# Patient Record
Sex: Female | Born: 1950 | Race: White | Hispanic: Yes | Smoking: Never smoker
Health system: Southern US, Community
[De-identification: ages and names within clinical notes are randomized; demographics above are authoritative.]

## PROBLEM LIST (undated history)

## (undated) DIAGNOSIS — I509 Heart failure, unspecified: Secondary | ICD-10-CM

## (undated) DIAGNOSIS — J159 Unspecified bacterial pneumonia: Secondary | ICD-10-CM

## (undated) DIAGNOSIS — E119 Type 2 diabetes mellitus without complications: Secondary | ICD-10-CM

## (undated) DIAGNOSIS — I10 Essential (primary) hypertension: Secondary | ICD-10-CM

## (undated) HISTORY — DX: Unspecified bacterial pneumonia: J15.9

---

## 2008-05-05 ENCOUNTER — Emergency Department (HOSPITAL_COMMUNITY): Admission: EM | Admit: 2008-05-05 | Discharge: 2008-05-06 | Payer: Self-pay | Admitting: Emergency Medicine

## 2008-12-02 ENCOUNTER — Ambulatory Visit: Payer: Self-pay | Admitting: Physician Assistant

## 2008-12-02 DIAGNOSIS — E1165 Type 2 diabetes mellitus with hyperglycemia: Secondary | ICD-10-CM | POA: Insufficient documentation

## 2008-12-02 DIAGNOSIS — I10 Essential (primary) hypertension: Secondary | ICD-10-CM | POA: Insufficient documentation

## 2008-12-02 LAB — CONVERTED CEMR LAB
Glucose, Urine, Semiquant: NEGATIVE
Nitrite: NEGATIVE
Protein, U semiquant: 100
Specific Gravity, Urine: 1.025
WBC Urine, dipstick: NEGATIVE

## 2008-12-03 ENCOUNTER — Encounter: Payer: Self-pay | Admitting: Physician Assistant

## 2008-12-03 ENCOUNTER — Telehealth: Payer: Self-pay | Admitting: Physician Assistant

## 2008-12-04 ENCOUNTER — Telehealth: Payer: Self-pay | Admitting: Physician Assistant

## 2008-12-04 ENCOUNTER — Encounter: Payer: Self-pay | Admitting: Physician Assistant

## 2008-12-04 LAB — CONVERTED CEMR LAB
ALT: 19 units/L (ref 0–35)
AST: 22 units/L (ref 0–37)
Alkaline Phosphatase: 78 units/L (ref 39–117)
Basophils Absolute: 0 10*3/uL (ref 0.0–0.1)
Basophils Relative: 0 % (ref 0–1)
Cholesterol: 160 mg/dL (ref 0–200)
Creatinine, Ser: 0.57 mg/dL (ref 0.40–1.20)
Eosinophils Absolute: 0.1 10*3/uL (ref 0.0–0.7)
LDL Cholesterol: 90 mg/dL (ref 0–99)
MCHC: 33 g/dL (ref 30.0–36.0)
MCV: 96 fL (ref 78.0–100.0)
Neutro Abs: 5.3 10*3/uL (ref 1.7–7.7)
Neutrophils Relative %: 56 % (ref 43–77)
Platelets: 257 10*3/uL (ref 150–400)
RBC: 4.48 M/uL (ref 3.87–5.11)
Sodium: 139 meq/L (ref 135–145)
TSH: 0.881 microintl units/mL (ref 0.350–4.500)
Total Bilirubin: 1.1 mg/dL (ref 0.3–1.2)
Total CHOL/HDL Ratio: 3.3
Total Protein: 7.6 g/dL (ref 6.0–8.3)
VLDL: 22 mg/dL (ref 0–40)
WBC: 9.4 10*3/uL (ref 4.0–10.5)

## 2008-12-05 ENCOUNTER — Ambulatory Visit (HOSPITAL_COMMUNITY): Admission: RE | Admit: 2008-12-05 | Discharge: 2008-12-05 | Payer: Self-pay | Admitting: Internal Medicine

## 2008-12-10 ENCOUNTER — Ambulatory Visit: Payer: Self-pay | Admitting: Physician Assistant

## 2008-12-10 ENCOUNTER — Encounter: Payer: Self-pay | Admitting: Physician Assistant

## 2008-12-10 LAB — CONVERTED CEMR LAB
Glucose, Urine, Semiquant: NEGATIVE
Protein, U semiquant: 100
Specific Gravity, Urine: 1.03
WBC Urine, dipstick: NEGATIVE

## 2008-12-11 ENCOUNTER — Telehealth (INDEPENDENT_AMBULATORY_CARE_PROVIDER_SITE_OTHER): Payer: Self-pay | Admitting: *Deleted

## 2008-12-12 ENCOUNTER — Ambulatory Visit: Payer: Self-pay | Admitting: Internal Medicine

## 2008-12-16 ENCOUNTER — Ambulatory Visit: Payer: Self-pay | Admitting: Physician Assistant

## 2008-12-18 ENCOUNTER — Encounter: Payer: Self-pay | Admitting: Physician Assistant

## 2008-12-19 ENCOUNTER — Encounter: Payer: Self-pay | Admitting: Physician Assistant

## 2008-12-20 ENCOUNTER — Telehealth: Payer: Self-pay | Admitting: Physician Assistant

## 2008-12-20 ENCOUNTER — Encounter: Payer: Self-pay | Admitting: Physician Assistant

## 2008-12-20 LAB — CONVERTED CEMR LAB: Microalb, Ur: 12.4 mg/dL — ABNORMAL HIGH (ref 0.00–1.89)

## 2008-12-24 ENCOUNTER — Encounter: Payer: Self-pay | Admitting: Physician Assistant

## 2008-12-26 ENCOUNTER — Encounter: Payer: Self-pay | Admitting: Physician Assistant

## 2009-01-01 ENCOUNTER — Ambulatory Visit: Payer: Self-pay | Admitting: *Deleted

## 2009-01-02 ENCOUNTER — Ambulatory Visit: Payer: Self-pay | Admitting: Physician Assistant

## 2009-01-15 ENCOUNTER — Telehealth (INDEPENDENT_AMBULATORY_CARE_PROVIDER_SITE_OTHER): Payer: Self-pay | Admitting: *Deleted

## 2009-01-15 ENCOUNTER — Ambulatory Visit: Payer: Self-pay | Admitting: Physician Assistant

## 2009-01-15 DIAGNOSIS — R809 Proteinuria, unspecified: Secondary | ICD-10-CM | POA: Insufficient documentation

## 2009-01-15 DIAGNOSIS — M545 Low back pain: Secondary | ICD-10-CM

## 2009-01-27 ENCOUNTER — Ambulatory Visit: Payer: Self-pay | Admitting: Physician Assistant

## 2009-01-29 ENCOUNTER — Telehealth: Payer: Self-pay | Admitting: Physician Assistant

## 2009-02-17 ENCOUNTER — Encounter: Payer: Self-pay | Admitting: Physician Assistant

## 2009-03-03 ENCOUNTER — Ambulatory Visit: Payer: Self-pay | Admitting: Physician Assistant

## 2009-03-03 DIAGNOSIS — B3731 Acute candidiasis of vulva and vagina: Secondary | ICD-10-CM | POA: Insufficient documentation

## 2009-03-03 DIAGNOSIS — B351 Tinea unguium: Secondary | ICD-10-CM | POA: Insufficient documentation

## 2009-03-03 DIAGNOSIS — B373 Candidiasis of vulva and vagina: Secondary | ICD-10-CM

## 2009-03-03 DIAGNOSIS — K5900 Constipation, unspecified: Secondary | ICD-10-CM | POA: Insufficient documentation

## 2009-03-03 LAB — CONVERTED CEMR LAB
Blood Glucose, Fingerstick: 285
KOH Prep: NEGATIVE
Nitrite: NEGATIVE
Protein, U semiquant: >=300

## 2009-03-04 ENCOUNTER — Encounter: Payer: Self-pay | Admitting: Physician Assistant

## 2009-03-05 ENCOUNTER — Telehealth: Payer: Self-pay | Admitting: Physician Assistant

## 2009-03-16 ENCOUNTER — Encounter: Payer: Self-pay | Admitting: Physician Assistant

## 2009-03-18 LAB — CONVERTED CEMR LAB
Calcium: 9.1 mg/dL (ref 8.4–10.5)
Chlamydia, DNA Probe: NEGATIVE
Chloride: 101 meq/L (ref 96–112)
Creatinine, Ser: 0.68 mg/dL (ref 0.40–1.20)
GC Probe Amp, Genital: NEGATIVE

## 2009-04-24 ENCOUNTER — Telehealth: Payer: Self-pay | Admitting: Physician Assistant

## 2009-06-02 ENCOUNTER — Ambulatory Visit: Payer: Self-pay | Admitting: Physician Assistant

## 2009-06-02 LAB — CONVERTED CEMR LAB
Hgb A1c MFr Bld: 9.6 %
Nitrite: NEGATIVE
Specific Gravity, Urine: >=1.03
pH: 5

## 2009-06-03 ENCOUNTER — Encounter: Payer: Self-pay | Admitting: Physician Assistant

## 2009-06-05 ENCOUNTER — Encounter: Payer: Self-pay | Admitting: Physician Assistant

## 2009-06-05 DIAGNOSIS — N390 Urinary tract infection, site not specified: Secondary | ICD-10-CM

## 2009-06-05 LAB — CONVERTED CEMR LAB
BUN: 14 mg/dL (ref 6–23)
Creatinine, Ser: 0.54 mg/dL (ref 0.40–1.20)
Crystals: NONE SEEN

## 2009-06-30 ENCOUNTER — Ambulatory Visit: Payer: Self-pay | Admitting: Physician Assistant

## 2009-07-08 ENCOUNTER — Ambulatory Visit: Payer: Self-pay | Admitting: Physician Assistant

## 2009-07-09 ENCOUNTER — Telehealth: Payer: Self-pay | Admitting: Physician Assistant

## 2009-07-15 ENCOUNTER — Telehealth: Payer: Self-pay | Admitting: Physician Assistant

## 2009-07-15 ENCOUNTER — Ambulatory Visit: Payer: Self-pay | Admitting: Physician Assistant

## 2009-07-29 ENCOUNTER — Ambulatory Visit: Payer: Self-pay | Admitting: Physician Assistant

## 2009-08-08 ENCOUNTER — Ambulatory Visit: Payer: Self-pay | Admitting: Physician Assistant

## 2009-08-19 ENCOUNTER — Ambulatory Visit (HOSPITAL_COMMUNITY): Admission: RE | Admit: 2009-08-19 | Discharge: 2009-08-19 | Payer: Self-pay | Admitting: Gastroenterology

## 2009-08-19 LAB — HM COLONOSCOPY

## 2009-09-15 ENCOUNTER — Ambulatory Visit: Payer: Self-pay | Admitting: Physician Assistant

## 2009-09-15 ENCOUNTER — Ambulatory Visit (HOSPITAL_COMMUNITY): Admission: RE | Admit: 2009-09-15 | Discharge: 2009-09-15 | Payer: Self-pay | Admitting: Physician Assistant

## 2009-09-15 DIAGNOSIS — J209 Acute bronchitis, unspecified: Secondary | ICD-10-CM

## 2009-09-15 LAB — CONVERTED CEMR LAB: Hgb A1c MFr Bld: 8.5 %

## 2009-09-16 LAB — CONVERTED CEMR LAB
BUN: 16 mg/dL (ref 6–23)
Chloride: 100 meq/L (ref 96–112)
Eosinophils Relative: 6 % — ABNORMAL HIGH (ref 0–5)
HCT: 43.5 % (ref 36.0–46.0)
Hemoglobin: 14.4 g/dL (ref 12.0–15.0)
Lymphocytes Relative: 19 % (ref 12–46)
Lymphs Abs: 2.4 10*3/uL (ref 0.7–4.0)
Platelets: 278 10*3/uL (ref 150–400)
Potassium: 4.8 meq/L (ref 3.5–5.3)
WBC: 12.5 10*3/uL — ABNORMAL HIGH (ref 4.0–10.5)

## 2009-09-17 ENCOUNTER — Ambulatory Visit: Payer: Self-pay | Admitting: Internal Medicine

## 2009-09-23 ENCOUNTER — Ambulatory Visit: Payer: Self-pay | Admitting: Physician Assistant

## 2009-09-25 ENCOUNTER — Ambulatory Visit: Payer: Self-pay | Admitting: Physician Assistant

## 2009-09-25 DIAGNOSIS — R05 Cough: Secondary | ICD-10-CM

## 2009-09-25 DIAGNOSIS — R0902 Hypoxemia: Secondary | ICD-10-CM | POA: Insufficient documentation

## 2009-09-26 ENCOUNTER — Encounter: Payer: Self-pay | Admitting: Physician Assistant

## 2009-09-26 LAB — CONVERTED CEMR LAB
BUN: 14 mg/dL (ref 6–23)
CO2: 26 meq/L (ref 19–32)
Chloride: 99 meq/L (ref 96–112)
Creatinine, Ser: 0.6 mg/dL (ref 0.40–1.20)
Eosinophils Absolute: 0.2 10*3/uL (ref 0.0–0.7)
Eosinophils Relative: 2 % (ref 0–5)
Glucose, Bld: 167 mg/dL — ABNORMAL HIGH (ref 70–99)
HCT: 42 % (ref 36.0–46.0)
Hemoglobin: 14.4 g/dL (ref 12.0–15.0)
Lymphs Abs: 2.5 10*3/uL (ref 0.7–4.0)
MCV: 94.4 fL (ref 78.0–100.0)
Monocytes Absolute: 0.5 10*3/uL (ref 0.1–1.0)
Monocytes Relative: 5 % (ref 3–12)
Platelets: 269 10*3/uL (ref 150–400)
RBC: 4.45 M/uL (ref 3.87–5.11)
WBC: 11.4 10*3/uL — ABNORMAL HIGH (ref 4.0–10.5)

## 2009-09-30 ENCOUNTER — Ambulatory Visit (HOSPITAL_COMMUNITY): Admission: RE | Admit: 2009-09-30 | Discharge: 2009-09-30 | Payer: Self-pay | Admitting: Internal Medicine

## 2009-10-09 ENCOUNTER — Ambulatory Visit: Payer: Self-pay | Admitting: Physician Assistant

## 2009-10-09 DIAGNOSIS — J159 Unspecified bacterial pneumonia: Secondary | ICD-10-CM

## 2009-10-09 DIAGNOSIS — H10029 Other mucopurulent conjunctivitis, unspecified eye: Secondary | ICD-10-CM | POA: Insufficient documentation

## 2009-10-09 HISTORY — DX: Unspecified bacterial pneumonia: J15.9

## 2009-10-21 ENCOUNTER — Ambulatory Visit: Payer: Self-pay | Admitting: Physician Assistant

## 2009-10-28 ENCOUNTER — Ambulatory Visit: Payer: Self-pay | Admitting: Physician Assistant

## 2009-11-28 ENCOUNTER — Ambulatory Visit: Payer: Self-pay | Admitting: Physician Assistant

## 2009-11-30 LAB — CONVERTED CEMR LAB
CO2: 25 meq/L (ref 19–32)
Calcium: 9.8 mg/dL (ref 8.4–10.5)
Chloride: 98 meq/L (ref 96–112)
Creatinine, Ser: 0.64 mg/dL (ref 0.40–1.20)
Glucose, Bld: 173 mg/dL — ABNORMAL HIGH (ref 70–99)

## 2009-12-05 ENCOUNTER — Ambulatory Visit: Payer: Self-pay | Admitting: Physician Assistant

## 2009-12-30 ENCOUNTER — Ambulatory Visit: Payer: Self-pay | Admitting: Physician Assistant

## 2009-12-30 DIAGNOSIS — R82998 Other abnormal findings in urine: Secondary | ICD-10-CM

## 2009-12-30 LAB — CONVERTED CEMR LAB
Bacteria, UA: NONE SEEN
Bilirubin Urine: NEGATIVE
Casts: NONE SEEN /lpf
Nitrite: NEGATIVE
Specific Gravity, Urine: 1.025
Urobilinogen, UA: 0.2

## 2009-12-31 ENCOUNTER — Encounter: Payer: Self-pay | Admitting: Physician Assistant

## 2010-01-01 ENCOUNTER — Encounter: Payer: Self-pay | Admitting: Physician Assistant

## 2010-01-07 ENCOUNTER — Ambulatory Visit (HOSPITAL_COMMUNITY): Admission: RE | Admit: 2010-01-07 | Discharge: 2010-01-07 | Payer: Self-pay | Admitting: Family Medicine

## 2010-01-07 ENCOUNTER — Encounter: Payer: Self-pay | Admitting: Physician Assistant

## 2010-01-14 ENCOUNTER — Encounter: Payer: Self-pay | Admitting: Physician Assistant

## 2010-01-20 ENCOUNTER — Ambulatory Visit: Payer: Self-pay | Admitting: Internal Medicine

## 2010-01-20 ENCOUNTER — Encounter: Payer: Self-pay | Admitting: Physician Assistant

## 2010-01-21 ENCOUNTER — Telehealth: Payer: Self-pay | Admitting: Physician Assistant

## 2010-01-21 ENCOUNTER — Encounter: Payer: Self-pay | Admitting: Physician Assistant

## 2010-01-21 LAB — CONVERTED CEMR LAB
CO2: 23 meq/L (ref 19–32)
Calcium: 9.2 mg/dL (ref 8.4–10.5)
Collection Interval-CRCL: 24 hr
Creatinine 24 HR UR: 670 mg/24hr — ABNORMAL LOW (ref 700–1800)
Creatinine, Ser: 0.6 mg/dL (ref 0.40–1.20)
Creatinine, Urine: 53.2 mg/dL
Protein, Ur: 125 mg/24hr — ABNORMAL HIGH (ref 50–100)
Sodium: 136 meq/L (ref 135–145)

## 2010-01-29 ENCOUNTER — Telehealth (INDEPENDENT_AMBULATORY_CARE_PROVIDER_SITE_OTHER): Payer: Self-pay | Admitting: Nurse Practitioner

## 2010-02-06 ENCOUNTER — Encounter (INDEPENDENT_AMBULATORY_CARE_PROVIDER_SITE_OTHER): Payer: Self-pay | Admitting: Nurse Practitioner

## 2010-02-16 ENCOUNTER — Telehealth (INDEPENDENT_AMBULATORY_CARE_PROVIDER_SITE_OTHER): Payer: Self-pay | Admitting: Nurse Practitioner

## 2010-02-17 ENCOUNTER — Ambulatory Visit: Payer: Self-pay | Admitting: Nurse Practitioner

## 2010-03-23 ENCOUNTER — Ambulatory Visit: Payer: Self-pay | Admitting: Physician Assistant

## 2010-03-23 ENCOUNTER — Encounter (INDEPENDENT_AMBULATORY_CARE_PROVIDER_SITE_OTHER): Payer: Self-pay | Admitting: Nurse Practitioner

## 2010-03-23 DIAGNOSIS — E669 Obesity, unspecified: Secondary | ICD-10-CM

## 2010-03-31 ENCOUNTER — Encounter (INDEPENDENT_AMBULATORY_CARE_PROVIDER_SITE_OTHER): Payer: Self-pay | Admitting: Nurse Practitioner

## 2010-03-31 ENCOUNTER — Ambulatory Visit: Payer: Self-pay | Admitting: Nurse Practitioner

## 2010-03-31 LAB — CONVERTED CEMR LAB
Cholesterol: 175 mg/dL (ref 0–200)
HDL: 47 mg/dL (ref >39)
LDL Cholesterol: 102 mg/dL — ABNORMAL HIGH (ref 0–99)
Total CHOL/HDL Ratio: 3.7
Triglycerides: 130 mg/dL (ref <150)
VLDL: 26 mg/dL (ref 0–40)

## 2010-04-01 ENCOUNTER — Encounter (INDEPENDENT_AMBULATORY_CARE_PROVIDER_SITE_OTHER): Payer: Self-pay | Admitting: Nurse Practitioner

## 2010-04-27 ENCOUNTER — Encounter: Payer: Self-pay | Admitting: Internal Medicine

## 2010-05-04 ENCOUNTER — Ambulatory Visit
Admission: RE | Admit: 2010-05-04 | Discharge: 2010-05-04 | Payer: Self-pay | Source: Home / Self Care | Attending: Nurse Practitioner | Admitting: Nurse Practitioner

## 2010-05-04 ENCOUNTER — Encounter (INDEPENDENT_AMBULATORY_CARE_PROVIDER_SITE_OTHER): Payer: Self-pay | Admitting: Nurse Practitioner

## 2010-05-04 DIAGNOSIS — B353 Tinea pedis: Secondary | ICD-10-CM | POA: Insufficient documentation

## 2010-05-04 LAB — CONVERTED CEMR LAB
Blood in Urine, dipstick: NEGATIVE
Glucose, Urine, Semiquant: NEGATIVE
Nitrite: NEGATIVE
Protein, U semiquant: NEGATIVE
Urobilinogen, UA: 0.2
WBC Urine, dipstick: NEGATIVE
pH: 5.5

## 2010-05-05 NOTE — Letter (Signed)
Summary: [facility]  [facility]   Imported By: Arta Bruce 03/06/2010 11:28:26  _____________________________________________________________________  External Attachment:    Type:   Image     Comment:   External Document

## 2010-05-05 NOTE — Progress Notes (Signed)
Summary: Please call lab  Phone Note Outgoing Call   Summary of Call: 24 hour urine was for creatinine clearance and total protein. Please have lab do calculation for creatinine clearance and check total protein.  Initial call taken by: Brynda Rim,  January 21, 2010 8:35 AM  Follow-up for Phone Call        spoke with lab and added test Follow-up by: Armenia Shannon,  January 21, 2010 11:23 AM

## 2010-05-05 NOTE — Assessment & Plan Note (Signed)
Summary: FU VISIT IN 2 MONTHS WITH Carol Finley FOR DM//GK   Vital Signs:  Patient profile:   60 year old female Height:      62 inches Weight:      179 pounds BMI:     32.86 Temp:     97.9 degrees F oral Pulse rate:   78 / minute Pulse rhythm:   regular BP sitting:   164 / 106  (left arm) Cuff size:   large  Vitals Entered By: Armenia Shannon (September 15, 2009 9:53 AM) CC: Hypertension Management Is Patient Diabetic? Yes Pain Assessment Patient in pain? no      CBG Result 209  Does patient need assistance? Functional Status Self care Ambulation Normal   Primary Care Provider:  Tereso Newcomer PA-C  CC:  Hypertension Management.  History of Present Illness: Here for f/u.  HTN:  Not taking HCTZ.  States she was not given refill.  Suspect this is why her BP has been up of late.   Cough:  Notes cough for about 2 weeks.  No fever recorded but feels hot.  Cough is productive of sputum that is yellow.  Notes sore throat.  Notes headache . . . frontal.  Notes upper chest pain when she coughs.  Also, notes dyspnea especially at night.  No over the counter therapies tried.  She is using some candies and it is helping a little with her cough.  Notes she had some URI symptoms prior to her cough starting.    DM:  A1C 8.5 today.  Max'd out on all meds.     Hypertension History:      She denies syncope.  Further comments include: see HPI.        Positive major cardiovascular risk factors include female age 87 years old or older, diabetes, and hypertension.  Negative major cardiovascular risk factors include non-tobacco-user status.     Problems Prior to Update: 1)  Acute Bronchitis  (ICD-466.0) 2)  Vaginitis, Candidal  (ICD-112.1) 3)  Onychomycosis, Bilateral  (ICD-110.1) 4)  Constipation  (ICD-564.00) 5)  Microalbuminuria  (ICD-791.0) 6)  Lumbago  (ICD-724.2) 7)  Uti  (ICD-599.0) 8)  Preventive Health Care  (ICD-V70.0) 9)  Family History Diabetes 1st Degree Relative  (ICD-V18.0) 10)   Essential Hypertension, Benign  (ICD-401.1) 11)  Diabetes Mellitus, Type II  (ICD-250.00)  Allergies: No Known Drug Allergies  Physical Exam  General:  alert, well-developed, and well-nourished.   Head:  normocephalic and atraumatic.   Neck:  no jvd  Lungs:  coarse breath sounds throughout exp rhonchi throughout as well no wheezing  Heart:  normal rate, regular rhythm, and no gallop.   Abdomen:  soft and non-tender.   Extremities:  trace left pedal edema and trace right pedal edema.   Neurologic:  alert & oriented X3 and cranial nerves II-XII intact.   Psych:  normally interactive.     Impression & Recommendations:  Problem # 1:  ESSENTIAL HYPERTENSION, BENIGN (ICD-401.1)  restart HCTZ f/u at f/iu visit   Her updated medication list for this problem includes:    Atenolol 100 Mg Tabs (Atenolol) ..... One tab daily    Hydrochlorothiazide 25 Mg Tabs (Hydrochlorothiazide) ..... One tab daily    Norvasc 10 Mg Tabs (Amlodipine besylate) ..... One tab daily    Lisinopril 40 Mg Tabs (Lisinopril) .Marland Kitchen... Take 1 tablet by mouth two times a day    Catapres 0.3 Mg Tabs (Clonidine hcl) .Marland Kitchen... Take 1 tablet by mouth two times a  day  Orders: T-Basic Metabolic Panel [id number])  Problem # 2:  DIABETES MELLITUS, TYPE II (ICD-250.00)  suggest she start Lantus 10 units at bedtime will give Rx and have her schedule appt with Susie to get instruction decrease glucotrol to once daily  close f/u with me  Her updated medication list for this problem includes:    Janumet 50-1000 Mg Tabs (Sitagliptin-metformin hcl) .Marland Kitchen... Take 1 tablet by mouth two times a day    Glucotrol Xl 10 Mg Xr24h-tab (Glipizide) .Marland Kitchen... Take 1 tablet by mouth two times a day    Lisinopril 40 Mg Tabs (Lisinopril) .Marland Kitchen... Take 1 tablet by mouth two times a day    Aspirin 81 Mg Tabs (Aspirin) .Marland Kitchen... Take 1 tablet by mouth once a day    Lantus Solostar 100 Unit/ml Soln (Insulin glargine) ..... Inect 10 units at  bedtime  Orders: Capillary Blood Glucose/CBG (82948) Hemoglobin A1C (83036)  Problem # 3:  ACUTE BRONCHITIS (ICD-466.0)  concerned about her lung exam will treat with doxy x 7 days give albuterol to use scheduled at first, then as needed check cxr get cbc, bmet today f/u with me in 7-10 days  Orders: CXR- 2view (CXR) T-CBC w/Diff ([id number]) T-Basic Metabolic Panel ([id number])  Her updated medication list for this problem includes:    Doxycycline Hyclate 100 Mg Tabs (Doxycycline hyclate) .Marland Kitchen... Take 1 tablet by mouth two times a day    Proventil Hfa 108 (90 Base) Mcg/act Aers (Albuterol sulfate) .Marland Kitchen... 1-2 puffs every 6 hours as needed  Complete Medication List: 1)  Atenolol 100 Mg Tabs (Atenolol) .... One tab daily 2)  Hydrochlorothiazide 25 Mg Tabs (Hydrochlorothiazide) .... One tab daily 3)  Norvasc 10 Mg Tabs (Amlodipine besylate) .... One tab daily 4)  Janumet 50-1000 Mg Tabs (Sitagliptin-metformin hcl) .... Take 1 tablet by mouth two times a day 5)  Glucotrol Xl 10 Mg Xr24h-tab (Glipizide) .... Take 1 tablet by mouth two times a day 6)  Lisinopril 40 Mg Tabs (Lisinopril) .... Take 1 tablet by mouth two times a day 7)  Catapres 0.3 Mg Tabs (Clonidine hcl) .... Take 1 tablet by mouth two times a day 8)  Naprosyn 500 Mg Tabs (Naproxen) .... Take 1 tablet by mouth two times a day as needed for pain 9)  Flexeril 10 Mg Tabs (Cyclobenzaprine hcl) .... Take 1 tab by mouth at bedtime as needed for muscle spasm 10)  Diflucan 150 Mg Tabs (Fluconazole) .Marland Kitchen.. 1 by mouth x 1 11)  Sidekick Blood Glucose System Devi (Blood gluc meter disp-strips) .... As directed 12)  Glucose Meter Test Strips (any Brand)  .... Check sugar once a day 13)  Aspirin 81 Mg Tabs (Aspirin) .... Take 1 tablet by mouth once a day 14)  Lantus Solostar 100 Unit/ml Soln (Insulin glargine) .... Inect 10 units at bedtime 15)  Doxycycline Hyclate 100 Mg Tabs (Doxycycline hyclate) .... Take 1 tablet by mouth two times  a day 16)  Proventil Hfa 108 (90 Base) Mcg/act Aers (Albuterol sulfate) .Marland Kitchen.. 1-2 puffs every 6 hours as needed  Hypertension Assessment/Plan:      The patient's hypertensive risk group is category C: Target organ damage and/or diabetes.  Her calculated 10 year risk of coronary heart disease is 20 %.  Today's blood pressure is 164/106.    Patient Instructions: 1)  Get Lantus insulin filled at pharmacy. 2)  Schedule appointment with Susie Piper to get instruction on how to use insulin.   3)  Do not start using  insulin until you have seen Susie. 4)  Decrease Glucotrol to Take 1 tablet by mouth once a day once you start the Lantus. 5)  Keep checking sugars regularly and bring recording in to next appointment. 6)  Drink plenty of fluids.  Get plenty of rest. 7)  Take tylenol 500 mg 2 tabs every 6 hours as needed for pain or fever. 8)  Call or go to the ED if you run a fever of 101 or higher or you feel worse. 9)  Take antibiotics until all gone. 10)  Use Proventil every 6 hours for 2 days, then use every 6 hours as needed. 11)  Schedule follow up with Mahamadou Weltz in 7-10 days for breathing. 12)  Schedule follow up with Lorin Picket in one month for diabetes and to follow up on taking insulin. 13)  Call our office if your sugar drops too low and you feel bad with it. Prescriptions: PROVENTIL HFA 108 (90 BASE) MCG/ACT AERS (ALBUTEROL SULFATE) 1-2 puffs every 6 hours as needed  #1 x 2   Entered and Authorized by:   Tereso Newcomer PA-C   Signed by:   Tereso Newcomer PA-C on 09/15/2009   Method used:   Print then Give to Patient   RxID:   [id number] DOXYCYCLINE HYCLATE 100 MG TABS (DOXYCYCLINE HYCLATE) Take 1 tablet by mouth two times a day  #14 x 0   Entered and Authorized by:   Tereso Newcomer PA-C   Signed by:   Tereso Newcomer PA-C on 09/15/2009   Method used:   Print then Give to Patient   RxID:   [id number] LANTUS SOLOSTAR 100 UNIT/ML SOLN (INSULIN GLARGINE) Inect 10 units at bedtime  #1 mo supply x  11   Entered and Authorized by:   Tereso Newcomer PA-C   Signed by:   Tereso Newcomer PA-C on 09/15/2009   Method used:   Print then Give to Patient   RxID:   [id number] HYDROCHLOROTHIAZIDE 25 MG TABS (HYDROCHLOROTHIAZIDE) one tab daily  #30 x 5   Entered and Authorized by:   Tereso Newcomer PA-C   Signed by:   Tereso Newcomer PA-C on 09/15/2009   Method used:   Print then Give to Patient   RxID:   [id number]   Laboratory Results   Blood Tests     HGBA1C: 8.5%   (Normal Range: Non-Diabetic - 3-6%   Control Diabetic - 6-8%) CBG Random:: 209mg /dL       Colonoscopy  Procedure date:  08/19/2009  Findings:       Results: Normal. Location:  [facility].  Dr. Dulce Sellar  Comments:      Repeat colonoscopy in 10 years.

## 2010-05-05 NOTE — Progress Notes (Signed)
Summary: REQUESTING NEEDLES.  Phone Note Call from Patient Call back at [phone]   Summary of Call: CALLING REQUESTING NEEDLES AND  INSULIN  MEDICINE . [facility]T TO [facility]  AND SAID THEY CAN'T GIVE IT TO HER. Initial call taken by: Domenic Polite,  January 29, 2010 10:54 AM  Follow-up for Phone Call        She needs a Rx for the needles.  Dutch Quint RN  January 29, 2010 10:56 AM   Additional Follow-up for Phone Call Additional follow up Details #1::        Clarify - does pt need insulin? It appears pt has refills. New Rx for insulin syringe done and faxed to [facility] pharmacy. Additional Follow-up by: Lehman Prom FNP,  January 29, 2010 1:09 PM    Additional Follow-up for Phone Call Additional follow up Details #2::    Via interpreter -- she does need insulin as well.  Advised of faxed Rx for syringes to [facility] and to call to ask them to refill insulin.  Verbalized understanding.  Dutch Quint RN  January 29, 2010 2:30 PM   New/Updated Medications: INSULIN SYRINGE 29G X 1/2" 1 ML MISC (INSULIN SYRINGE-NEEDLE U-100) Use with lantus insulin daily Prescriptions: INSULIN SYRINGE 29G X 1/2" 1 ML MISC (INSULIN SYRINGE-NEEDLE U-100) Use with lantus insulin daily  #100 x 5   Entered and Authorized by:   Lehman Prom FNP   Signed by:   Lehman Prom FNP on 01/29/2010   Method used:   Faxed to ...       [facility] (retail)       [address] [place], [place]  [address]       Ph: [phone]       Fax: [phone]   RxID:   [id number]

## 2010-05-05 NOTE — Assessment & Plan Note (Signed)
Summary: return to the lab for bmet and bp check in 3 wks//gk  Nurse Visit   Vital Signs:  Patient profile:   60 year old female Pulse rate:   76 / minute BP sitting:   174 / 76  (left arm) Cuff size:   regular  Vitals Entered By: Gaylyn Cheers RN (November 28, 2009 10:20 AM)  Patient Instructions: 1)  Pt. is to go to [facility]. they will put her pills in pill box for the next week. She is to [facility] for BP check next week and we will refill her pill box at that time.   Primary Care Provider:  Tereso Newcomer PA-C  CC:  Blood pressure check Took meds @ 8:00am Discussed with Wende Mott PA.  History of Present Illness: here for BP check, denies C.P., SOB, HA,Vertigo, no ankle swelling    Review of Systems CV:  Pt. medications are not matching with dates, unable to clarify why she has extra pills even though she states she takes them daily..  CC: Blood pressure check Took meds @ 8:00am Discussed with Wende Mott PA   Allergies: No Known Drug Allergies  Orders Added: 1)  Est. Patient Level I [[address]] 2)  T-Basic Metabolic Panel [phone]

## 2010-05-05 NOTE — Progress Notes (Signed)
Summary: MEDICATION CLARIFICATION  Phone Note Call from Patient   Reason for Call: Refill Medication Summary of Call: PT STATING THAT SHE IS SUPPOSED TO BE GETTING ATENELOL FROM OUR [facility]. BUT THE RX SAYS THEY DO NOT HAVE THIS ON FILE NOR HAVE THEY EVER FILLED FOR THIS MEDICATION.  PT STATES SHE SPOKE WITH SOMEONE AT [facility] AND CALLED THE MEDICINE IN TO THE [facility] ON 04-21-09 BUT IS UNABLE TO RECEIVE MEDICATION.  CAN PCP FAX RX TO [facility] AND/OR SOMEONE CALL PT TO CLARIFY?  THANKS Initial call taken by: Deirdre Priest,  April 24, 2009 3:01 PM  Follow-up for Phone Call        forward to provider Follow-up by: Armenia Shannon,  May 01, 2009 3:27 PM    Prescriptions: ATENOLOL 100 MG TABS (ATENOLOL) one tab daily  #30 x 5   Entered and Authorized by:   Tereso Newcomer PA-C   Signed by:   Tereso Newcomer PA-C on 05/01/2009   Method used:   Faxed to ...       [facility] - [facility] (retail)       [address] [place], [place]  [address]       Ph: [phone]       Fax: [phone]   RxID:   [id number]

## 2010-05-05 NOTE — Progress Notes (Signed)
Summary: Office Visit//DEPRESSION SCREENING  Office Visit//DEPRESSION SCREENING   Imported By: Arta Bruce 04/24/2009 14:40:01  _____________________________________________________________________  External Attachment:    Type:   Image     Comment:   External Document
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
## 2010-05-05 NOTE — Letter (Signed)
Summary: BLOOD SUGAR READING//PT   BLOOD SUGAR READING//PT   Imported By: Arta Bruce 01/01/2010 15:12:19  _____________________________________________________________________  External Attachment:    Type:   Image     Comment:   External Document

## 2010-05-05 NOTE — Letter (Signed)
Summary: NUTRITION SUMMARY//SUSIE  NUTRITION SUMMARY//SUSIE   Imported By: Arta Bruce 07/16/2009 10:17:25  _____________________________________________________________________  External Attachment:    Type:   Image     Comment:   External Document

## 2010-05-05 NOTE — Progress Notes (Signed)
  Phone Note Outgoing Call   Summary of Call: Please pull her med sheet from [facility] pharmacy. Ask Darrelyn Hillock to call her to ask if she is taking her meds. Initial call taken by: Tereso Newcomer PA-C,  July 09, 2009 9:54 PM  Follow-up for Phone Call        spoke with [facility] from pharmacy and they will send over profile.Marland KitchenMarland KitchenMikey College CMA  July 11, 2009 10:13 AM   Left message to the pt to call me back.Manon Hilding  July 11, 2009 2:37 PM  Additional Follow-up for Phone Call Additional follow up Details #1::        I spoke with the pt and she shared with me that she only is taking seven  different medications, which include the following: ATENOLOL 50 MG/ NORVASC 10 MG / JANUMET 50-1000 / GLUCOTROL / XL 10 MG/ LISINOPRIL 40 MG/ NAPROXYN (ONLY FOR PAIN OR WHEN IS NEEDED) AND CLONODIN HSL 0.0MM. Marland KitchenManon Hilding  July 14, 2009 9:20 AM    Additional Follow-up for Phone Call Additional follow up Details #2::    patient seen today Tereso Newcomer PA-C  July 15, 2009 9:54 AM

## 2010-05-05 NOTE — Letter (Signed)
Summary: NUTRITION SUMMARY  NUTRITION SUMMARY   Imported By: Arta Bruce 11/17/2009 14:50:58  _____________________________________________________________________  External Attachment:    Type:   Image     Comment:   External Document

## 2010-05-05 NOTE — Letter (Signed)
Summary: PODIATRY  PODIATRY   Imported By: Arta Bruce 10/20/2009 11:21:50  _____________________________________________________________________  External Attachment:    Type:   Image     Comment:   External Document

## 2010-05-05 NOTE — Assessment & Plan Note (Signed)
Summary: *PER Kamari Bilek/ SCHED FU WITH Alece Koppel ON BREATHING IN 7-10 DAYS//GK   Vital Signs:  Patient profile:   60 year old female Height:      62 inches Weight:      173 pounds BMI:     31.76 O2 Sat:      90 % on Room air Temp:     98.5 degrees F oral Pulse rate:   82 / minute Pulse rhythm:   regular Resp:     18 per minute BP sitting:   155 / 83  (left arm) Cuff size:   large  Vitals Entered By: Armenia Shannon (October 09, 2009 10:13 AM)  O2 Flow:  Room air CC: f/u on breathing.... pt says she is still coughing.. pt says she finished the meds that was given to her... and her eyes started getting red yesterday morning.... Is Patient Diabetic? No Pain Assessment Patient in pain? no       Does patient need assistance? Functional Status Self care Ambulation Normal   Primary Care Provider:  Tereso Newcomer PA-C  CC:  f/u on breathing.... pt says she is still coughing.. pt says she finished the meds that was given to her... and her eyes started getting red yesterday morning.....  History of Present Illness: 60 year old female here for followup on cough and cold symptoms.  As noted previously, I was concerned about her lung exam.  I ultimately sent her for chest CT.  There were no findings of interstitial lung disease.  She did have bilateral stranding that seem to be consistent with infectious or inflammatory process with some reactive-looking lymph nodes.  She had a fluid.  Her BNP was completely normal.  She has not had any symptoms of congestive heart failure.  She initially seemed to respond to doxycycline.  I did not continue antibiotics or retreat her with improvement in her symptoms.  However, today she notes that she continues to cough.  She really does not feel any better.  She has productive sputum that is yellow or green.  She denies hemoptysis.  She denies fevers or night sweats.  She has had decreased appetite.  Her anterior chest hurts only when she coughs.  She denies syncope.   She denies shortness of breath.  She did start to develop some redness of both of her eyes about 4 days ago.  It feels like her sand in her eyes.  She denies any difficulty in seeing.  Current Medications (verified): 1)  Atenolol 100 Mg Tabs (Atenolol) .... One Tab Daily 2)  Hydrochlorothiazide 25 Mg Tabs (Hydrochlorothiazide) .... One Tab Daily 3)  Norvasc 10 Mg Tabs (Amlodipine Besylate) .... One Tab Daily 4)  Janumet 50-1000 Mg Tabs (Sitagliptin-Metformin Hcl) .... Take 1 Tablet By Mouth Two Times A Day 5)  Glucotrol Xl 10 Mg Xr24h-Tab (Glipizide) .... Take 1 Tablet By Mouth Two Times A Day 6)  Lisinopril 40 Mg Tabs (Lisinopril) .... Take 1 Tablet By Mouth Two Times A Day 7)  Catapres 0.3 Mg Tabs (Clonidine Hcl) .... Take 1 Tablet By Mouth Two Times A Day 8)  Naprosyn 500 Mg Tabs (Naproxen) .... Take 1 Tablet By Mouth Two Times A Day As Needed For Pain 9)  Flexeril 10 Mg Tabs (Cyclobenzaprine Hcl) .... Take 1 Tab By Mouth At Bedtime As Needed For Muscle Spasm 10)  Diflucan 150 Mg Tabs (Fluconazole) .Marland Kitchen.. 1 By Mouth X 1 11)  Sidekick Blood Glucose System  Devi (Blood Gluc Meter Disp-Strips) .... As  Directed 12)  Glucose Meter Test Strips (Any Brand) .... Check Sugar Once A Day 13)  Aspirin 81 Mg Tabs (Aspirin) .... Take 1 Tablet By Mouth Once A Day 14)  Lantus Solostar 100 Unit/ml Soln (Insulin Glargine) .... Inect 10 Units At Bedtime 15)  Proventil Hfa 108 (90 Base) Mcg/act Aers (Albuterol Sulfate) .Marland Kitchen.. 1-2 Puffs Every 6 Hours As Needed 16)  Tessalon Perles 100 Mg Caps (Benzonatate) .... Take 1 Capsule By Mouth Three Times A Day As Needed For Cough 17)  Tussionex Pennkinetic Er 8-10 Mg/49ml Lqcr (Chlorpheniramine-Hydrocodone) .Marland Kitchen.. 1 Teaspoon By Mouth At Bedtime As Needed For Severe Cough  Allergies (verified): No Known Drug Allergies  Physical Exam  General:  alert, well-developed, and well-nourished.   Head:  normocephalic and atraumatic.   Eyes:  conjunctival injection R>L pupils  equal, pupils round, pupils reactive to light, and conjunctival injection.   Ears:  R ear normal and L ear normal.   Nose:  no external deformity.   Mouth:  pharynx pink and moist, no erythema, and no exudates.   Neck:  supple and no cervical lymphadenopathy.   Lungs:  bibasilar crackles 1/3 up the chest minimal exp wheezing at bases  Heart:  normal rate and regular rhythm.   Abdomen:  soft and non-tender.   Neurologic:  alert & oriented X3 and cranial nerves II-XII intact.   Skin:  turgor normal.   Psych:  normally interactive.     Impression & Recommendations:  Problem # 1:  BACTERIAL PNEUMONIA (ICD-482.9)  changes on CT bilateral originally she had improvement and thought that changes on CT were from resolving infection now with ongoing symptoms, think she needs to be treated for pneumonia likely explanation for her hypoxemia she is otherwise asymptomatic low risk and no need for admission to the hospital  will treat her with ceftin x 10 days; zpak x 5 days f/u with me in one week  Her updated medication list for this problem includes:    Ceftin 250 Mg Tabs (Cefuroxime axetil) .Marland Kitchen... Take 2 tabs by mouth two times a day until all gone (write in spanish)  Problem # 2:  CONJUNCTIVITIS, BACTERIAL (ICD-372.03)  tobramycin drops  Her updated medication list for this problem includes:    Tobrex 0.3 % Soln (Tobramycin sulfate) .Marland Kitchen... 1-2 drops in each eye every 4 hours for 5-7 days (may stop when eyes clear) (write in spanish)  Complete Medication List: 1)  Atenolol 100 Mg Tabs (Atenolol) .... One tab daily 2)  Hydrochlorothiazide 25 Mg Tabs (Hydrochlorothiazide) .... One tab daily 3)  Norvasc 10 Mg Tabs (Amlodipine besylate) .... One tab daily 4)  Janumet 50-1000 Mg Tabs (Sitagliptin-metformin hcl) .... Take 1 tablet by mouth two times a day 5)  Glucotrol Xl 10 Mg Xr24h-tab (Glipizide) .... Take 1 tablet by mouth two times a day 6)  Lisinopril 40 Mg Tabs (Lisinopril) .... Take  1 tablet by mouth two times a day 7)  Catapres 0.3 Mg Tabs (Clonidine hcl) .... Take 1 tablet by mouth two times a day 8)  Naprosyn 500 Mg Tabs (Naproxen) .... Take 1 tablet by mouth two times a day as needed for pain 9)  Flexeril 10 Mg Tabs (Cyclobenzaprine hcl) .... Take 1 tab by mouth at bedtime as needed for muscle spasm 10)  Diflucan 150 Mg Tabs (Fluconazole) .Marland Kitchen.. 1 by mouth x 1 11)  Sidekick Blood Glucose System Devi (Blood gluc meter disp-strips) .... As directed 12)  Glucose Meter Test Strips (any Brand)  .Marland KitchenMarland KitchenMarland Kitchen  Check sugar once a day 13)  Aspirin 81 Mg Tabs (Aspirin) .... Take 1 tablet by mouth once a day 14)  Lantus Solostar 100 Unit/ml Soln (Insulin glargine) .... Inect 10 units at bedtime 15)  Proventil Hfa 108 (90 Base) Mcg/act Aers (Albuterol sulfate) .Marland Kitchen.. 1-2 puffs every 6 hours as needed 16)  Tessalon Perles 100 Mg Caps (Benzonatate) .... Take 1 capsule by mouth three times a day as needed for cough 17)  Tussionex Pennkinetic Er 8-10 Mg/39ml Lqcr (Chlorpheniramine-hydrocodone) .Marland Kitchen.. 1 teaspoon by mouth at bedtime as needed for severe cough 18)  Ceftin 250 Mg Tabs (Cefuroxime axetil) .... Take 2 tabs by mouth two times a day until all gone (write in spanish) 19)  Tobrex 0.3 % Soln (Tobramycin sulfate) .Marland Kitchen.. 1-2 drops in each eye every 4 hours for 5-7 days (may stop when eyes clear) (write in spanish)  Patient Instructions: 1)  Get plenty of rest. 2)  Drink plenty of fluids. 3)  Take Tylenol 500 mg 1-2 tabs every 6 hours as needed for fever or pain. 4)  I have sent 2 antibiotics, eye drops and cough medicine to the [facility]. pharmacy.  Please pick them up today and start taking as directed. 5)  Go to the ED if you feel worse. 6)  Schedule follow up appointment with Brenae Lasecki in 1 week. Prescriptions: TUSSIONEX PENNKINETIC ER 8-10 MG/5ML LQCR (CHLORPHENIRAMINE-HYDROCODONE) 1 teaspoon by mouth at bedtime as needed for severe cough  #40 mL x 0   Entered and Authorized by:   Tereso Newcomer  PA-C   Signed by:   Tereso Newcomer PA-C on 10/09/2009   Method used:   Print then Give to Patient   RxID:   [id number] TESSALON PERLES 100 MG CAPS (BENZONATATE) Take 1 capsule by mouth three times a day as needed for cough  #30 x 1   Entered and Authorized by:   Tereso Newcomer PA-C   Signed by:   Tereso Newcomer PA-C on 10/09/2009   Method used:   Faxed to ...       [facility] - [facility] (retail)       [address] [place], [place]  [address]       Ph: [phone]       Fax: [phone]   RxID:   [id number] PROVENTIL HFA 108 (90 BASE) MCG/ACT AERS (ALBUTEROL SULFATE) 1-2 puffs every 6 hours as needed  #1 x 2   Entered and Authorized by:   Tereso Newcomer PA-C   Signed by:   Tereso Newcomer PA-C on 10/09/2009   Method used:   Faxed to ...       [facility] - Pharmac (retail)       [address] [place], [place]  [address]       Ph: [phone]       Fax: [phone]   RxID:   [id number] TOBREX 0.3 % SOLN (TOBRAMYCIN SULFATE) 1-2 drops in each eye every 4 hours for 5-7 days (may stop when eyes clear) (write in Spanish)  #1 bottle x 0   Entered and Authorized by:   Tereso Newcomer PA-C   Signed by:   Tereso Newcomer PA-C on 10/09/2009   Method used:   Faxed to ...       [facility] - Pharmac (retail)       [address]  [place], [place]  [address]       Ph: [phone]       Fax: [phone]   RxID:   [id number] ZITHROMAX Z-PAK 250 MG TABS (AZITHROMYCIN) Take 2 tabs by mouth on day 1 (today), then 1 tab by mouth once daily for 4 days (write in Spanish)  #1 Zpak x 0   Entered and Authorized by:   Tereso Newcomer PA-C   Signed by:   Tereso Newcomer PA-C on 10/09/2009   Method used:   Faxed to ...       [facility] - Pharmac (retail)       [address] [place], [place]  [address]       Ph: [phone]       Fax: [phone]   RxID:    [id number] CEFTIN 250 MG TABS (CEFUROXIME AXETIL) Take 2 tabs by mouth two times a day until all gone (write in Spanish)  #40 x 0   Entered and Authorized by:   Tereso Newcomer PA-C   Signed by:   Tereso Newcomer PA-C on 10/09/2009   Method used:   Faxed to ...       [facility] - Pharmac (retail)       [address] [place], [place]  [address]       Ph: [phone]       Fax: [phone]   RxID:   [id number]

## 2010-05-05 NOTE — Miscellaneous (Signed)
  Clinical Lists Changes  Observations: Added new observation of MAMMO DUE: 01/2011 (01/20/2010 14:47) Added new observation of PRIMARY MD: Tereso Newcomer PA-C (01/20/2010 14:47) Added new observation of MAMMOGRAM: normal (01/07/2010 14:48)       Preventive Care Screening  Mammogram:    Date:  01/07/2010    Next Due:  01/2011    Results:  normal

## 2010-05-05 NOTE — Letter (Signed)
Summary: NUTRITION SUMMARY/SUSIE  NUTRITION SUMMARY/SUSIE   Imported By: Arta Bruce 10/20/2009 09:51:29  _____________________________________________________________________  External Attachment:    Type:   Image     Comment:   External Document

## 2010-05-05 NOTE — Assessment & Plan Note (Signed)
Summary: follow up app in 3 months with Carol Finley for bp and dm/ ekg/ua//gk   Vital Signs:  Patient profile:   60 year old female Height:      62 inches Weight:      182 pounds BMI:     33.41 Temp:     98.3 degrees F oral Pulse rate:   79 / minute Pulse rhythm:   regular Resp:     18 per minute BP sitting:   180 / 73  (left arm) Cuff size:   large  Vitals Entered By: Armenia Shannon (June 02, 2009 9:15 AM)  Serial Vital Signs/Assessments:  Time      Position  BP       Pulse  Resp  Temp     By 10:30 AM            168/92                         Tereso Newcomer PA-C  CC: f/u on dm, Hypertension Management Is Patient Diabetic? Yes Pain Assessment Patient in pain? no      CBG Result 257  Does patient need assistance? Functional Status Self care Ambulation Normal   CC:  f/u on dm and Hypertension Management.  History of Present Illness: Here for f/u.  HTN:  Not taking meds correctly.  There was a change in her meds at the pharmacy and I think this confused her.  She is supposed to be on Atenolol 100 mg once daily.  She has 50 mg tabs and is only taking one a day.  She was given lisinopril instead of accupril.  She took accupril 40 mg two times a day previously and she was changed to lisiopril 20 mg and is taking two times a day.  She had not taken any bp meds today, but did take just prior to me entering the room (clonidine and lisinopril).  DM:  Not checking sugars . . .no strips for her machine.  She never changed her Janumet.  She says she has never seen a dietician.  Hypertension History:      She denies chest pain, dyspnea with exertion, and syncope.  She notes no problems with any antihypertensive medication side effects.        Positive major cardiovascular risk factors include female age 46 years old or older, diabetes, and hypertension.  Negative major cardiovascular risk factors include non-tobacco-user status.     Allergies: No Known Drug Allergies  Physical  Exam  General:  alert, well-developed, and well-nourished.   Head:  normocephalic and atraumatic.   Neck:  supple.   Lungs:  normal breath sounds, no crackles, and no wheezes.   Heart:  normal rate and regular rhythm.   Abdomen:  soft.   Extremities:  no edema  Neurologic:  alert & oriented X3 and cranial nerves II-XII intact.   Psych:  normally interactive.     Impression & Recommendations:  Problem # 1:  DIABETES MELLITUS, TYPE II (ICD-250.00)  A1C is going up  I wanted to start insulin but, patient is not sure how to take and wants to avoid if at all poss she was supposed to have janumet changed last time, but she is still on same dose will change to Janumet 50/1000 mg two times a day refer to dietician if A1C does not go down, she will have to start Lantus  The following medications were removed from the medication list:  Lantus Solostar 100 Unit/ml Soln (Insulin glargine) ..... Inject 10 units at bedtime Her updated medication list for this problem includes:    Janumet 50-1000 Mg Tabs (Sitagliptin-metformin hcl) .Marland Kitchen... Take 1 tablet by mouth two times a day    Glucotrol Xl 10 Mg Xr24h-tab (Glipizide) .Marland Kitchen... Take 1 tablet by mouth two times a day    Lisinopril 40 Mg Tabs (Lisinopril) .Marland Kitchen... Take 1 tablet by mouth two times a day  Orders: Diabetic Clinic Referral (Diabetic) T- Hemoglobin A1C ([address]-60454) T-Urinalysis (09811-91478)  Problem # 2:  ESSENTIAL HYPERTENSION, BENIGN (ICD-401.1)  uncontrolled change meds back to previous doses . . . she was much better controlled previously  Her updated medication list for this problem includes:    Atenolol 100 Mg Tabs (Atenolol) ..... One tab daily    Hydrochlorothiazide 25 Mg Tabs (Hydrochlorothiazide) ..... One tab daily    Norvasc 10 Mg Tabs (Amlodipine besylate) ..... One tab daily    Lisinopril 40 Mg Tabs (Lisinopril) .Marland Kitchen... Take 1 tablet by mouth two times a day    Catapres 0.3 Mg Tabs (Clonidine hcl) .Marland Kitchen... Take 1  tablet by mouth two times a day  Orders: T-Basic Metabolic Panel [phone]) T-Urinalysis (57846-96295)  Problem # 3:  URINALYSIS, ABNORMAL (ICD-791.9)  send for micro and culture no symptoms if microscopic and culture neg. . . will need 24 hour urine for protein and creat clearance  Orders: T-Culture, Urine ([id number]) T- * Misc. Laboratory test [phone])  Problem # 4:  PREVENTIVE HEALTH CARE (ICD-V70.0)  pneumovax today refer to Dr. Corinda Gubler for screening colo  Future Orders: Gastroenterology Referral (GI) ... 06/30/2009  Complete Medication List: 1)  Atenolol 100 Mg Tabs (Atenolol) .... One tab daily 2)  Hydrochlorothiazide 25 Mg Tabs (Hydrochlorothiazide) .... One tab daily 3)  Norvasc 10 Mg Tabs (Amlodipine besylate) .... One tab daily 4)  Janumet 50-1000 Mg Tabs (Sitagliptin-metformin hcl) .... Take 1 tablet by mouth two times a day 5)  Glucotrol Xl 10 Mg Xr24h-tab (Glipizide) .... Take 1 tablet by mouth two times a day 6)  Lisinopril 40 Mg Tabs (Lisinopril) .... Take 1 tablet by mouth two times a day 7)  Catapres 0.3 Mg Tabs (Clonidine hcl) .... Take 1 tablet by mouth two times a day 8)  Naprosyn 500 Mg Tabs (Naproxen) .... Take 1 tablet by mouth two times a day as needed for pain 9)  Flexeril 10 Mg Tabs (Cyclobenzaprine hcl) .... Take 1 tab by mouth at bedtime as needed for muscle spasm 10)  Diflucan 150 Mg Tabs (Fluconazole) .Marland Kitchen.. 1 by mouth x 1 11)  Sidekick Blood Glucose System Devi (Blood gluc meter disp-strips) .... As directed 12)  Glucose Meter Test Strips (any Brand)  .... Check sugar once a day  Other Orders: Pneumococcal Vaccine (36644) Admin 1st Vaccine (03474) Admin 1st Vaccine [facility]) [phone])  Hypertension Assessment/Plan:      The patient's hypertensive risk group is category C: Target organ damage and/or diabetes.  Her calculated 10 year risk of coronary heart disease is 20 %.  Today's blood pressure is 180/73.    Patient Instructions: 1)  You  need to take Atenolol 50 mg, take 2 tablest a day. 2)  You will get a new prescription for your lisinopril so that it will be like your old prescription. 3)  Schedule appt at foot clinic at [facility].  4)  Schedule appt with Drucilla Schmidt for diabetes diet education. 5)  Pneumovax today. 6)  Please schedule a follow-up appointment in  1 month with Delesa Kawa for blood pressure. 7)  Bring in listing of your sugars to your next appt. 8)  Call us if you cannot get strips for your machine.  Prescriptions: LISINOPRIL 40 MG TABS (LISINOPRIL) Take 1 tablet by mouth two times a day  #60 x 5   Entered and Authorized by:   Tereso Newcomer PA-C   Signed by:   Tereso Newcomer PA-C on 06/02/2009   Method used:   Reprint   RxID:   [id number] JANUMET 50-1000 MG TABS (SITAGLIPTIN-METFORMIN HCL) Take 1 tablet by mouth two times a day  #60 x 5   Entered and Authorized by:   Tereso Newcomer PA-C   Signed by:   Tereso Newcomer PA-C on 06/02/2009   Method used:   Reprint   RxID:   [id number] GLUCOSE METER TEST STRIPS (ANY BRAND) check sugar once a day  #1 mo supply x 11   Entered and Authorized by:   Tereso Newcomer PA-C   Signed by:   Tereso Newcomer PA-C on 06/02/2009   Method used:   Reprint   RxID:   [id number] JANUMET 50-1000 MG TABS (SITAGLIPTIN-METFORMIN HCL) Take 1 tablet by mouth two times a day  #60 x 5   Entered and Authorized by:   Tereso Newcomer PA-C   Signed by:   Tereso Newcomer PA-C on 06/02/2009   Method used:   Print then Give to Patient   RxID:   [id number] LANTUS SOLOSTAR 100 UNIT/ML SOLN (INSULIN GLARGINE) Inject 10 units at bedtime  #1 mo supply x 11   Entered and Authorized by:   Tereso Newcomer PA-C   Signed by:   Tereso Newcomer PA-C on 06/02/2009   Method used:   Print then Give to Patient   RxID:   [id number] LISINOPRIL 40 MG TABS (LISINOPRIL) Take 1 tablet by mouth two times a day  #60 x 5   Entered and Authorized by:   Tereso Newcomer PA-C   Signed by:   Tereso Newcomer PA-C on  06/02/2009   Method used:   Print then Give to Patient   RxID:   [id number] GLUCOSE METER TEST STRIPS (ANY BRAND) check sugar once a day  #1 mo supply x 11   Entered and Authorized by:   Tereso Newcomer PA-C   Signed by:   Tereso Newcomer PA-C on 06/02/2009   Method used:   Print then Give to Patient   RxID:   [id number]   Laboratory Results   Urine Tests    Routine Urinalysis   Glucose: 500   (Normal Range: Negative) Bilirubin: negative   (Normal Range: Negative) Ketone: negative   (Normal Range: Negative) Spec. Gravity: >=1.030   (Normal Range: 1.003-1.035) Blood: trace-intact   (Normal Range: Negative) pH: 5.0   (Normal Range: 5.0-8.0) Protein: >=300   (Normal Range: Negative) Urobilinogen: 0.2   (Normal Range: 0-1) Nitrite: negative   (Normal Range: Negative) Leukocyte Esterace: trace   (Normal Range: Negative)     Blood Tests   Date/Time Received: June 02, 2009 9:32 AM   HGBA1C: 9.6%   (Normal Range: Non-Diabetic - 3-6%   Control Diabetic - 6-8%) CBG Random:: 257mg /dL      Orders Added: 1)  Diabetic Clinic Referral [Diabetic] 2)  Est. Patient Level III [70623] 3)  T-Basic Metabolic Panel [76283-15176] 4)  Gastroenterology Referral [GI] 5)  T- Hemoglobin A1C [83036-23375] 6)  T-Urinalysis [81003-65000] 7)  T-Culture, Urine [[id number]] 8)  T- * Misc. Laboratory test [99999] 9)  Pneumococcal  Vaccine [90732] 10)  Admin 1st Vaccine [90471] 11)  Admin 1st Vaccine [facility]) [16109U]    Pneumovax Vaccine    Vaccine Type: Pneumovax    Site: left deltoid    Mfr: Merck    Dose: 0.5 ml    Route: IM    Given by: Armenia Shannon    Exp. Date: 05/02/2010    Lot #: [id number]    VIS given: 11/01/95 version given June 02, 2009.

## 2010-05-05 NOTE — Assessment & Plan Note (Signed)
Summary: # sched follow up with Carol Finley in one month dor dm and fu on ta...   Vital Signs:  Patient profile:   60 year old female Height:      62 inches Weight:      175 pounds BMI:     32.12 O2 Sat:      96 % on Room air Temp:     97.9 degrees F oral Pulse rate:   65 / minute Pulse rhythm:   regular Resp:     18 per minute BP sitting:   155 / 74  (left arm) Cuff size:   large  Vitals Entered By: [place] Shannon (October 28, 2009 10:39 AM)  O2 Flow:  Room air  Serial Vital Signs/Assessments:  Time      Position  BP       Pulse  Resp  Temp     By 11:10 AM            122/60                         Tereso Newcomer PA-C  CC: pt says her breathing is alot better... f/u on dm..., Hypertension Management Is Patient Diabetic? Yes Pain Assessment Patient in pain? no      CBG Result 177  Does patient need assistance? Functional Status Self care Ambulation Normal   Primary Care Provider:  Tereso Newcomer PA-C  CC:  pt says her breathing is alot better... f/u on dm... and Hypertension Management.  History of Present Illness: Here for f/u.  DM:  Taking Lantus.  Ran out last night.  Thought the pen was broken.  Explained to her that it is empty.  She needs a refill.  She states she used for 2 weeks.  Brings in list of sugars since 10/10/2009.  She has readings in am before breakfast of 135-175.  No hypoglycemic episodes.  Pneumonia:  Breathing much better.  Finished antibx's.  No more cough.  Feels better.  Hypertension History:      She denies headache, chest pain, dyspnea with exertion, and syncope.  She notes no problems with any antihypertensive medication side effects.        Positive major cardiovascular risk factors include female age 65 years old or older, diabetes, and hypertension.  Negative major cardiovascular risk factors include non-tobacco-user status.     Problems Prior to Update: 1)  Conjunctivitis, Bacterial  (ICD-372.03) 2)  Bacterial Pneumonia  (ICD-482.9) 3)   Hypoxemia  (ICD-799.02) 4)  Cough  (ICD-786.2) 5)  Acute Bronchitis  (ICD-466.0) 6)  Vaginitis, Candidal  (ICD-112.1) 7)  Onychomycosis, Bilateral  (ICD-110.1) 8)  Constipation  (ICD-564.00) 9)  Microalbuminuria  (ICD-791.0) 10)  Lumbago  (ICD-724.2) 11)  Uti  (ICD-599.0) 12)  Preventive Health Care  (ICD-V70.0) 13)  Family History Diabetes 1st Degree Relative  (ICD-V18.0) 14)  Essential Hypertension, Benign  (ICD-401.1) 15)  Diabetes Mellitus, Type II  (ICD-250.00)  Current Medications (verified): 1)  Atenolol 100 Mg Tabs (Atenolol) .... One Tab Daily 2)  Hydrochlorothiazide 25 Mg Tabs (Hydrochlorothiazide) .... One Tab Daily 3)  Norvasc 10 Mg Tabs (Amlodipine Besylate) .... One Tab Daily 4)  Janumet 50-1000 Mg Tabs (Sitagliptin-Metformin Hcl) .... Take 1 Tablet By Mouth Two Times A Day 5)  Glucotrol Xl 10 Mg Xr24h-Tab (Glipizide) .... Take 1 Tablet By Mouth Two Times A Day 6)  Lisinopril 40 Mg Tabs (Lisinopril) .... Take 1 Tablet By Mouth Two Times A Day 7)  Catapres 0.3 Mg Tabs (Clonidine Hcl) .... Take 1 Tablet By Mouth Two Times A Day 8)  Naprosyn 500 Mg Tabs (Naproxen) .... Take 1 Tablet By Mouth Two Times A Day As Needed For Pain 9)  Flexeril 10 Mg Tabs (Cyclobenzaprine Hcl) .... Take 1 Tab By Mouth At Bedtime As Needed For Muscle Spasm 10)  Diflucan 150 Mg Tabs (Fluconazole) .Marland Kitchen.. 1 By Mouth X 1 11)  Sidekick Blood Glucose System  Devi (Blood Gluc Meter Disp-Strips) .... As Directed 12)  Glucose Meter Test Strips (Any Brand) .... Check Sugar Once A Day 13)  Aspirin 81 Mg Tabs (Aspirin) .... Take 1 Tablet By Mouth Once A Day 14)  Lantus Solostar 100 Unit/ml Soln (Insulin Glargine) .... Inect 10 Units At Bedtime 15)  Proventil Hfa 108 (90 Base) Mcg/act Aers (Albuterol Sulfate) .Marland Kitchen.. 1-2 Puffs Every 6 Hours As Needed 16)  Tessalon Perles 100 Mg Caps (Benzonatate) .... Take 1 Capsule By Mouth Three Times A Day As Needed For Cough 17)  Tussionex Pennkinetic Er 8-10 Mg/43ml Lqcr  (Chlorpheniramine-Hydrocodone) .Marland Kitchen.. 1 Teaspoon By Mouth At Bedtime As Needed For Severe Cough 18)  Tobrex 0.3 % Soln (Tobramycin Sulfate) .Marland Kitchen.. 1-2 Drops in Each Eye Every 4 Hours For 5-7 Days (May Stop When Eyes Clear) (Write in Spanish)  Allergies (verified): No Known Drug Allergies  Physical Exam  General:  alert, well-developed, and well-nourished.   Head:  normocephalic and atraumatic.   Eyes:  pupils equal, pupils round, and pupils reactive to light.   Ears:  R ear normal and L ear normal.   Nose:  no nasal discharge.   Mouth:  pharynx pink and moist.   Neck:  supple.   Lungs:  improved breath sounds faint crackles at bases (improved from prior exam) no wheezes  Heart:  normal rate and regular rhythm.   Neurologic:  alert & oriented X3 and cranial nerves II-XII intact.   Psych:  normally interactive.     Impression & Recommendations:  Problem # 1:  DIABETES MELLITUS, TYPE II (ICD-250.00)  increase Lantus to 15 units continue other meds advised her to get Lantus refilled will send new Rx to pharmacy  Her updated medication list for this problem includes:    Janumet 50-1000 Mg Tabs (Sitagliptin-metformin hcl) .Marland Kitchen... Take 1 tablet by mouth two times a day    Glucotrol Xl 10 Mg Xr24h-tab (Glipizide) .Marland Kitchen... Take 1 tablet by mouth once daily    Lisinopril 40 Mg Tabs (Lisinopril) .Marland Kitchen... Take 1 tablet by mouth two times a day    Aspirin 81 Mg Tabs (Aspirin) .Marland Kitchen... Take 1 tablet by mouth once a day    Lantus Solostar 100 Unit/ml Soln (Insulin glargine) ..... Inect 15 units at bedtime  Orders: Capillary Blood Glucose/CBG (44034)  Problem # 2:  ESSENTIAL HYPERTENSION, BENIGN (ICD-401.1) admits to compliance on atenolol, norvasc, lisinopril, clonidine is supposed to be on HCTZ but not in her bag today when asked she notes that she lost bottle and has not taken in 3 days will refill and have her restart with BP check with bmet in 2 weeks repeat bp optimal by me  Her updated  medication list for this problem includes:    Atenolol 100 Mg Tabs (Atenolol) ..... One tab daily    Hydrochlorothiazide 25 Mg Tabs (Hydrochlorothiazide) ..... One tab daily    Norvasc 10 Mg Tabs (Amlodipine besylate) ..... One tab daily    Lisinopril 40 Mg Tabs (Lisinopril) .Marland Kitchen... Take 1 tablet by mouth two times  a day    Catapres 0.3 Mg Tabs (Clonidine hcl) .Marland Kitchen... Take 1 tablet by mouth two times a day  Problem # 3:  BACTERIAL PNEUMONIA (ICD-482.9) Assessment: Improved lung exam improved O2 sat on room air now 96% patient feels much better with less cough and breathing improved has finished all antibxs  Complete Medication List: 1)  Atenolol 100 Mg Tabs (Atenolol) .... One tab daily 2)  Hydrochlorothiazide 25 Mg Tabs (Hydrochlorothiazide) .... One tab daily 3)  Norvasc 10 Mg Tabs (Amlodipine besylate) .... One tab daily 4)  Janumet 50-1000 Mg Tabs (Sitagliptin-metformin hcl) .... Take 1 tablet by mouth two times a day 5)  Glucotrol Xl 10 Mg Xr24h-tab (Glipizide) .... Take 1 tablet by mouth once daily 6)  Lisinopril 40 Mg Tabs (Lisinopril) .... Take 1 tablet by mouth two times a day 7)  Catapres 0.3 Mg Tabs (Clonidine hcl) .... Take 1 tablet by mouth two times a day 8)  Naprosyn 500 Mg Tabs (Naproxen) .... Take 1 tablet by mouth two times a day as needed for pain 9)  Flexeril 10 Mg Tabs (Cyclobenzaprine hcl) .... Take 1 tab by mouth at bedtime as needed for muscle spasm 10)  Diflucan 150 Mg Tabs (Fluconazole) .Marland Kitchen.. 1 by mouth x 1 11)  Sidekick Blood Glucose System Devi (Blood gluc meter disp-strips) .... As directed 12)  Glucose Meter Test Strips (any Brand)  .... Check sugar once a day 13)  Aspirin 81 Mg Tabs (Aspirin) .... Take 1 tablet by mouth once a day 14)  Lantus Solostar 100 Unit/ml Soln (Insulin glargine) .... Inect 15 units at bedtime 15)  Proventil Hfa 108 (90 Base) Mcg/act Aers (Albuterol sulfate) .Marland Kitchen.. 1-2 puffs every 6 hours as needed 16)  Tessalon Perles 100 Mg Caps  (Benzonatate) .... Take 1 capsule by mouth three times a day as needed for cough 17)  Tussionex Pennkinetic Er 8-10 Mg/78ml Lqcr (Chlorpheniramine-hydrocodone) .Marland Kitchen.. 1 teaspoon by mouth at bedtime as needed for severe cough 18)  Tobrex 0.3 % Soln (Tobramycin sulfate) .Marland Kitchen.. 1-2 drops in each eye every 4 hours for 5-7 days (may stop when eyes clear) (write in spanish)  Hypertension Assessment/Plan:      The patient's hypertensive risk group is category C: Target organ damage and/or diabetes.  Her calculated 10 year risk of coronary heart disease is 17 %.  Today's blood pressure is 155/74.    Patient Instructions: 1)  Increase Lantus to 15 units at bedtime. 2)  Refill your Lantus Insulin.  The dial will not turn because you are out of insulin.  When you get to this point, the pen is empty and you need a refill.  You should be able to tell when you have a few days left and you will want to call our pharmacy 3-4 days ahead of time so you do not run out and have nothing to take. 3)  I have sent a refill for your Lantus Insulin and your HCTZ (hydrochlorothiazide) to the [facility]. pharmacy.  Please pick up meds and restart the HCTZ.  They should be ready in the next 3 days or so.  Please call before you go pick up. 4)  Return to the lab for a BMET and BP check with the nurse in 3 weeks. 5)  Schedule a follow up with Chrysten Woulfe for Diabetes in 2 months. Prescriptions: HYDROCHLOROTHIAZIDE 25 MG TABS (HYDROCHLOROTHIAZIDE) one tab daily  #30 x 5   Entered and Authorized by:   Tereso Newcomer PA-C  Signed by:   Tereso Newcomer PA-C on 10/28/2009   Method used:   Faxed to ...       [facility] (retail)       [address] [place], [place]  [address]       Ph: [phone]       Fax: [phone]   RxID:   [id number] LANTUS SOLOSTAR 100 UNIT/ML SOLN (INSULIN GLARGINE) Inect 15 units at bedtime  #1 mo. supply x 11   Entered and Authorized by:   Tereso Newcomer PA-C    Signed by:   Tereso Newcomer PA-C on 10/28/2009   Method used:   Faxed to ...       [facility] - [facility] (retail)       [address] [place], [place]  [address]       Ph: [phone]       Fax: [phone]   RxID:   [id number]

## 2010-05-05 NOTE — Letter (Signed)
Summary: BLOOD SUGER READING  BLOOD SUGER READING   Imported By: Arta Bruce 10/03/2009 14:39:34  _____________________________________________________________________  External Attachment:    Type:   Image     Comment:   External Document

## 2010-05-05 NOTE — Assessment & Plan Note (Signed)
Summary: BP CHECK /TMM  Nurse Visit   Vital Signs:  Patient profile:   60 year old female Pulse rate:   64 / minute BP sitting:   148 / 78  (left arm)  Vitals Entered By: CMA Student Linzie Collin CC: BP Check Comments Per Scott the pt. blood pressure is ok. Patients has medicine box and is aware of how to use the medicine box.   Allergies: No Known Drug Allergies  Orders Added: 1)  Est. Patient Nurse visit [09003]

## 2010-05-05 NOTE — Letter (Signed)
Summary: [facility]  [facility]   Imported By: Arta Bruce 04/08/2009 15:03:56  _____________________________________________________________________  External Attachment:    Type:   Image     Comment:   External Document

## 2010-05-05 NOTE — Letter (Signed)
Summary: GYNECOLOGIC CYTOLOGIC REPORT  GYNECOLOGIC CYTOLOGIC REPORT   Imported By: Arta Bruce 05/19/2009 15:09:37  _____________________________________________________________________  External Attachment:    Type:   Image     Comment:   External Document

## 2010-05-05 NOTE — Assessment & Plan Note (Signed)
Summary: *sched fu with Granger Chui in 7-10 days for breathing//gk   Vital Signs:  Patient profile:   60 year old female Height:      62 inches Weight:      174 pounds BMI:     31.94 O2 Sat:      88 % on Room air Temp:     97.9 degrees F oral Pulse rate:   72 / minute Pulse rhythm:   regular Resp:     18 per minute BP sitting:   197 / 72  (left arm) Cuff size:   large  Vitals Entered By: [place] Shannon (September 25, 2009 10:10 AM)  O2 Flow:  Room air  Serial Vital Signs/Assessments:  Time      Position  BP       Pulse  Resp  Temp     By 10:32 AM            138/80                         Tereso Newcomer PA-C 10:32 AM            140/80                         Tereso Newcomer PA-C  Comments: 10:32 AM left arm By: Tereso Newcomer PA-C  10:32 AM right arm  By: Tereso Newcomer PA-C   CC: f/u on breathing... pt says she stilll has the cough Is Patient Diabetic? Yes Pain Assessment Patient in pain? no      CBG Result 148  Does patient need assistance? Functional Status Self care Ambulation Normal   Primary Care Provider:  Tereso Newcomer PA-C  CC:  f/u on breathing... pt says she stilll has the cough.  History of Present Illness: Here for f/u. Cough is better but still present. Sputum is yellow.  No hemoptysis. Finished antibiotics. No fevers or chills. Breathing is better. No h/o DOE before she got sick.  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 Denies orthopnea, PND, edema, chest pain or syncope. Describes NYHA Class 2-2b symptoms.   Current Medications (verified): 1)  Atenolol 100 Mg Tabs (Atenolol) .... One Tab Daily 2)  Hydrochlorothiazide 25 Mg Tabs (Hydrochlorothiazide) .... One Tab Daily 3)  Norvasc 10 Mg Tabs (Amlodipine Besylate) .... One Tab Daily 4)  Janumet 50-1000 Mg Tabs (Sitagliptin-Metformin Hcl) .... Take 1 Tablet By Mouth Two Times A Day 5)  Glucotrol Xl 10 Mg Xr24h-Tab (Glipizide) .... Take 1 Tablet By Mouth Two Times A Day 6)   Lisinopril 40 Mg Tabs (Lisinopril) .... Take 1 Tablet By Mouth Two Times A Day 7)  Catapres 0.3 Mg Tabs (Clonidine Hcl) .... Take 1 Tablet By Mouth Two Times A Day 8)  Naprosyn 500 Mg Tabs (Naproxen) .... Take 1 Tablet By Mouth Two Times A Day As Needed For Pain 9)  Flexeril 10 Mg Tabs (Cyclobenzaprine Hcl) .... Take 1 Tab By Mouth At Bedtime As Needed For Muscle Spasm 10)  Diflucan 150 Mg Tabs (Fluconazole) .Marland Kitchen.. 1 By Mouth X 1 11)  Sidekick Blood Glucose System  Devi (Blood Gluc Meter Disp-Strips) .... As Directed 12)  Glucose Meter Test Strips (Any Brand) .... Check Sugar Once A Day 13)  Aspirin 81 Mg Tabs (Aspirin) .... Take 1 Tablet By Mouth Once A Day 14)  Lantus Solostar 100 Unit/ml Soln (Insulin Glargine) .... Inect 10  Units At Bedtime 15)  Proventil Hfa 108 (90 Base) Mcg/act Aers (Albuterol Sulfate) .Marland Kitchen.. 1-2 Puffs Every 6 Hours As Needed  Allergies (verified): No Known Drug Allergies  Physical Exam  General:  alert, well-developed, and well-nourished.   Head:  normocephalic and atraumatic.   Neck:  supple.   No JVD Lungs:  bibasilar crackles 1/3 up the chest minimal exp wheezing at bases  Heart:  normal rate, regular rhythm, and no gallop.   Abdomen:  soft, non-tender, and no hepatomegaly.   Extremities:  no edema  Neurologic:  alert & oriented X3 and cranial nerves II-XII intact.   Psych:  normally interactive.     Impression & Recommendations:  Problem # 1:  ACUTE BRONCHITIS (ICD-466.0) Assessment Improved  however her O2 sats are 88-89% on room air her lung exam is abnormal however, she has no symptoms of CHF, no JVD, no weight gain, no edema ? if she has interstitial fibrosis from chronic exposure  check bmet, repeat cbc, bnp if bnp abnormal, switch HCTZ to Lasix, send for echo, refer to cardiology and f/u with me sooner will send for chest CT (spoke to Dr. Kearney Hard . . . will get CT without contrast) if has interstitial changes, consider PFTs, ? pulmo referral  (would wait to see how lung exam improves) close f/u with me will have her get mucinex over the counter rx for tessalon perles continue albuterol as needed rx for tussinex at bedtime as needed   Her updated medication list for this problem includes:    Proventil Hfa 108 (90 Base) Mcg/act Aers (Albuterol sulfate) .Marland Kitchen... 1-2 puffs every 6 hours as needed    Tessalon Perles 100 Mg Caps (Benzonatate) .Marland Kitchen... Take 1 capsule by mouth three times a day as needed for cough    Tussionex Pennkinetic Er 8-10 Mg/27ml Lqcr (Chlorpheniramine-hydrocodone) .Marland Kitchen... 1 teaspoon by mouth at bedtime as needed for severe cough  Orders: T-Basic Metabolic Panel [phone]) T-BNP  (B Natriuretic Peptide) [phone]) T-CBC w/Diff [phone]) CT with & without Contrast (CT w&w/o Contrast)  Problem # 2:  ESSENTIAL HYPERTENSION, BENIGN (ICD-401.1) repeat better by me and closer to goal monitor for now   Her updated medication list for this problem includes:    Atenolol 100 Mg Tabs (Atenolol) ..... One tab daily    Hydrochlorothiazide 25 Mg Tabs (Hydrochlorothiazide) ..... One tab daily    Norvasc 10 Mg Tabs (Amlodipine besylate) ..... One tab daily    Lisinopril 40 Mg Tabs (Lisinopril) .Marland Kitchen... Take 1 tablet by mouth two times a day    Catapres 0.3 Mg Tabs (Clonidine hcl) .Marland Kitchen... Take 1 tablet by mouth two times a day  Complete Medication List: 1)  Atenolol 100 Mg Tabs (Atenolol) .... One tab daily 2)  Hydrochlorothiazide 25 Mg Tabs (Hydrochlorothiazide) .... One tab daily 3)  Norvasc 10 Mg Tabs (Amlodipine besylate) .... One tab daily 4)  Janumet 50-1000 Mg Tabs (Sitagliptin-metformin hcl) .... Take 1 tablet by mouth two times a day 5)  Glucotrol Xl 10 Mg Xr24h-tab (Glipizide) .... Take 1 tablet by mouth two times a day 6)  Lisinopril 40 Mg Tabs (Lisinopril) .... Take 1 tablet by mouth two times a day 7)  Catapres 0.3 Mg Tabs (Clonidine hcl) .... Take 1 tablet by mouth two times a day 8)  Naprosyn 500 Mg Tabs  (Naproxen) .... Take 1 tablet by mouth two times a day as needed for pain 9)  Flexeril 10 Mg Tabs (Cyclobenzaprine hcl) .... Take 1 tab by mouth at  bedtime as needed for muscle spasm 10)  Diflucan 150 Mg Tabs (Fluconazole) .Marland Kitchen.. 1 by mouth x 1 11)  Sidekick Blood Glucose System Devi (Blood gluc meter disp-strips) .... As directed 12)  Glucose Meter Test Strips (any Brand)  .... Check sugar once a day 13)  Aspirin 81 Mg Tabs (Aspirin) .... Take 1 tablet by mouth once a day 14)  Lantus Solostar 100 Unit/ml Soln (Insulin glargine) .... Inect 10 units at bedtime 15)  Proventil Hfa 108 (90 Base) Mcg/act Aers (Albuterol sulfate) .Marland Kitchen.. 1-2 puffs every 6 hours as needed 16)  Tessalon Perles 100 Mg Caps (Benzonatate) .... Take 1 capsule by mouth three times a day as needed for cough 17)  Tussionex Pennkinetic Er 8-10 Mg/70ml Lqcr (Chlorpheniramine-hydrocodone) .Marland Kitchen.. 1 teaspoon by mouth at bedtime as needed for severe cough  Patient Instructions: 1)  Get Mucinex 600 mg over the counter and Take 1 tablet by mouth two times a day. 2)  Take tessalon perles three times a day as needed for cough. 3)  You may take the Tussinex at bedtime as needed for cough (if cough is bothersome). 4)  Schedule follow up with Eliakim Tendler on breathing in 7-10 days. 5)  Call if you feel worse or become more short of breath.  If you feel really bad, go to the emergency room. Prescriptions: TUSSIONEX PENNKINETIC ER 8-10 MG/5ML LQCR (CHLORPHENIRAMINE-HYDROCODONE) 1 teaspoon by mouth at bedtime as needed for severe cough  #40 mL x 0   Entered and Authorized by:   Tereso Newcomer PA-C   Signed by:   Tereso Newcomer PA-C on 09/25/2009   Method used:   Print then Give to Patient   RxID:   [id number] TESSALON PERLES 100 MG CAPS (BENZONATATE) Take 1 capsule by mouth three times a day as needed for cough  #30 x 1   Entered and Authorized by:   Tereso Newcomer PA-C   Signed by:   Tereso Newcomer PA-C on 09/25/2009   Method used:   Print then Give to  Patient   RxID:   [id number]   Appended Document: *sched fu with Jaycee Pelzer in 7-10 days for breathing//gk Patient ambulated in office and O2 sat 87% We called [facility] who spoke with patient about setting up O2 at home. She is unable to afford and did not want to apply for assistance. She is not symptomatic and does not want to pursue O2. Recheck at f/u.

## 2010-05-05 NOTE — Progress Notes (Signed)
Summary: GI referral  Phone Note Outgoing Call   Summary of Call: Debra Patient did not have transportation and missed appt. Can we try to reschedule appt with GI for colonoscopy? Initial call taken by: Tereso Newcomer PA-C,  July 15, 2009 9:08 AM  Follow-up for Phone Call        Pt needs to call & R/S her appt with GI.Due to her transportation issues.I believe that would be the best solutions. Follow-up by: Candi Leash,  July 15, 2009 4:20 PM  Additional Follow-up for Phone Call Additional follow up Details #1::        [place], Please notify patient. Additional Follow-up by: Tereso Newcomer PA-C,  July 15, 2009 5:32 PM    Additional Follow-up for Phone Call Additional follow up Details #2::    pt is aware via ramon... [place] Shannon RMA  July 18, 2009 1:49 PM

## 2010-05-05 NOTE — Assessment & Plan Note (Signed)
Summary: BP; DM2   Vital Signs:  Patient profile:   60 year old female Height:      62 inches Weight:      176.2 pounds BMI:     32.34 Temp:     97.9 degrees F oral Pulse rate:   60 / minute Pulse rhythm:   regular Resp:     16 per minute BP sitting:   180 / 90  (left arm)  Vitals Entered By: Armenia Shannon (December 30, 2009 10:19 AM)  Serial Vital Signs/Assessments:  Time      Position  BP       Pulse  Resp  Temp     By 10:51 AM            150/78                         Tereso Newcomer PA-C 10:51 AM            154/84                         Tereso Newcomer PA-C  Comments: 10:51 AM left arm By: Tereso Newcomer PA-C  10:51 AM right arm By: Tereso Newcomer PA-C   CC: f/u.... pt brought meds..., Hypertension Management Is Patient Diabetic? Yes Pain Assessment Patient in pain? no       Does patient need assistance? Functional Status Self care Ambulation Normal   Primary Care Provider:  Tereso Newcomer PA-C  CC:  f/u.... pt brought meds... and Hypertension Management.  History of Present Illness: DM:  Sugars 140-150.  No hypoglycemic episodes.  Taking Lantus now.  Doing ok with this.    HTN:  States she is taking meds correctly.  Has a lot of pills left in her bottles.  Meds filled 8/25 for 30 days.  Should be out by now, but is not.  She was given a pill box last time.  Says she fills it up every day.    Cough:  Notes recent cough.  Has post nasal drip.  No fevers.    Hypertension History:      She complains of headache, but denies chest pain, dyspnea with exertion, and syncope.  She notes no problems with any antihypertensive medication side effects.        Positive major cardiovascular risk factors include female age 35 years old or older, diabetes, and hypertension.  Negative major cardiovascular risk factors include non-tobacco-user status.     Current Medications (verified): 1)  Atenolol 100 Mg Tabs (Atenolol) .... One Tab Daily 2)  Hydrochlorothiazide 25 Mg Tabs  (Hydrochlorothiazide) .... One Tab Daily 3)  Norvasc 10 Mg Tabs (Amlodipine Besylate) .... One Tab Daily 4)  Janumet 50-1000 Mg Tabs (Sitagliptin-Metformin Hcl) .... Take 1 Tablet By Mouth Two Times A Day 5)  Glucotrol Xl 10 Mg Xr24h-Tab (Glipizide) .... Take 1 Tablet By Mouth Once Daily 6)  Lisinopril 40 Mg Tabs (Lisinopril) .... Take 1 Tablet By Mouth Two Times A Day 7)  Catapres 0.3 Mg Tabs (Clonidine Hcl) .... Take 1 Tablet By Mouth Two Times A Day 8)  Naprosyn 500 Mg Tabs (Naproxen) .... Take 1 Tablet By Mouth Two Times A Day As Needed For Pain 9)  Flexeril 10 Mg Tabs (Cyclobenzaprine Hcl) .... Take 1 Tab By Mouth At Bedtime As Needed For Muscle Spasm 10)  Diflucan 150 Mg Tabs (Fluconazole) .Marland Kitchen.. 1 By Mouth X 1 11)  Sidekick  Blood Glucose System  Devi (Blood Gluc Meter Disp-Strips) .... As Directed 12)  Glucose Meter Test Strips (Any Brand) .... Check Sugar Once A Day 13)  Aspirin 81 Mg Tabs (Aspirin) .... Take 1 Tablet By Mouth Once A Day 14)  Lantus Solostar 100 Unit/ml Soln (Insulin Glargine) .... Inect 15 Units At Bedtime 15)  Proventil Hfa 108 (90 Base) Mcg/act Aers (Albuterol Sulfate) .Marland Kitchen.. 1-2 Puffs Every 6 Hours As Needed 16)  Tessalon Perles 100 Mg Caps (Benzonatate) .... Take 1 Capsule By Mouth Three Times A Day As Needed For Cough 17)  Tussionex Pennkinetic Er 8-10 Mg/4ml Lqcr (Chlorpheniramine-Hydrocodone) .Marland Kitchen.. 1 Teaspoon By Mouth At Bedtime As Needed For Severe Cough 18)  Tobrex 0.3 % Soln (Tobramycin Sulfate) .Marland Kitchen.. 1-2 Drops in Each Eye Every 4 Hours For 5-7 Days (May Stop When Eyes Clear) (Write in Spanish)  Allergies (verified): No Known Drug Allergies  Review of Systems GU:  Denies dysuria and urinary frequency.  Physical Exam  General:  alert, well-developed, and well-nourished.   Head:  normocephalic and atraumatic.   Eyes:  pupils equal, pupils round, and pupils reactive to light.   Ears:  R ear normal and L ear normal.   Nose:  no nasal discharge.   Mouth:   pharynx pink and moist.   Neck:  supple and no cervical lymphadenopathy.   Lungs:  normal breath sounds, no crackles, and no wheezes.   Heart:  normal rate and regular rhythm.   Abdomen:  soft and non-tender.   no abdominal bruits  Extremities:  no edema  Neurologic:  alert & oriented X3 and cranial nerves II-XII intact.   Psych:  normally interactive.     Impression & Recommendations:  Problem # 1:  ESSENTIAL HYPERTENSION, BENIGN (ICD-401.1) says she eats a lot of salt I am not convinced she is 100% compliant the pill box may have helped recently will give handout for watching her salt  BP better when I check it  says she took all meds today  really maxed out on all meds she may have better BP effect with taking a nonselective beta blocker change atenolol to carvedilol  Her updated medication list for this problem includes:    Carvedilol 12.5 Mg Tabs (Carvedilol) .Marland Kitchen... Take 1 tablet by mouth two times a day for blood pressure (atenolol has been discontinued)    Hydrochlorothiazide 25 Mg Tabs (Hydrochlorothiazide) ..... One tab daily    Norvasc 10 Mg Tabs (Amlodipine besylate) ..... One tab daily    Lisinopril 40 Mg Tabs (Lisinopril) .Marland Kitchen... Take 1 tablet by mouth two times a day    Catapres 0.3 Mg Tabs (Clonidine hcl) .Marland Kitchen... Take 1 tablet by mouth two times a day  Problem # 2:  DIABETES MELLITUS, TYPE II (ICD-250.00) A1C improved and sugars from home look better will cont curren meds sched retasure  get microalb  Her updated medication list for this problem includes:    Janumet 50-1000 Mg Tabs (Sitagliptin-metformin hcl) .Marland Kitchen... Take 1 tablet by mouth two times a day    Glucotrol Xl 10 Mg Xr24h-tab (Glipizide) .Marland Kitchen... Take 1 tablet by mouth once daily    Lisinopril 40 Mg Tabs (Lisinopril) .Marland Kitchen... Take 1 tablet by mouth two times a day    Aspirin 81 Mg Tabs (Aspirin) .Marland Kitchen... Take 1 tablet by mouth once a day    Lantus Solostar 100 Unit/ml Soln (Insulin glargine) ..... Inect 15  units at bedtime  Orders: T-Urine Microalbumin w/creat. ratio [phone]) Capillary Blood Glucose/CBG [phone])  Hemoglobin A1C (83036)  Problem # 3:  COUGH (ICD-786.2) has increased post nasal drip  lungs clear prob URI claritin as needed f/u as needed   Problem # 4:  PREVENTIVE HEALTH CARE (ICD-V70.0)  mammo past due  Orders: Mammogram (Screening) (Mammo)  Problem # 5:  URINALYSIS, ABNORMAL (ICD-791.9)  no symptoms  Orders: T-Culture, Urine ([id number]) T- * Misc. Laboratory test [phone])  Complete Medication List: 1)  Carvedilol 12.5 Mg Tabs (Carvedilol) .... Take 1 tablet by mouth two times a day for blood pressure (atenolol has been discontinued) 2)  Hydrochlorothiazide 25 Mg Tabs (Hydrochlorothiazide) .... One tab daily 3)  Norvasc 10 Mg Tabs (Amlodipine besylate) .... One tab daily 4)  Janumet 50-1000 Mg Tabs (Sitagliptin-metformin hcl) .... Take 1 tablet by mouth two times a day 5)  Glucotrol Xl 10 Mg Xr24h-tab (Glipizide) .... Take 1 tablet by mouth once daily 6)  Lisinopril 40 Mg Tabs (Lisinopril) .... Take 1 tablet by mouth two times a day 7)  Catapres 0.3 Mg Tabs (Clonidine hcl) .... Take 1 tablet by mouth two times a day 8)  Naprosyn 500 Mg Tabs (Naproxen) .... Take 1 tablet by mouth two times a day as needed for pain 9)  Flexeril 10 Mg Tabs (Cyclobenzaprine hcl) .... Take 1 tab by mouth at bedtime as needed for muscle spasm 10)  Diflucan 150 Mg Tabs (Fluconazole) .Marland Kitchen.. 1 by mouth x 1 11)  Sidekick Blood Glucose System Devi (Blood gluc meter disp-strips) .... As directed 12)  Glucose Meter Test Strips (any Brand)  .... Check sugar once a day 13)  Aspirin 81 Mg Tabs (Aspirin) .... Take 1 tablet by mouth once a day 14)  Lantus Solostar 100 Unit/ml Soln (Insulin glargine) .... Inect 15 units at bedtime 15)  Proventil Hfa 108 (90 Base) Mcg/act Aers (Albuterol sulfate) .Marland Kitchen.. 1-2 puffs every 6 hours as needed 16)  Tessalon Perles 100 Mg Caps (Benzonatate) ....  Take 1 capsule by mouth three times a day as needed for cough 17)  Tussionex Pennkinetic Er 8-10 Mg/101ml Lqcr (Chlorpheniramine-hydrocodone) .Marland Kitchen.. 1 teaspoon by mouth at bedtime as needed for severe cough 18)  Tobrex 0.3 % Soln (Tobramycin sulfate) .Marland Kitchen.. 1-2 drops in each eye every 4 hours for 5-7 days (may stop when eyes clear) (write in spanish)  Hypertension Assessment/Plan:      The patient's hypertensive risk group is category C: Target organ damage and/or diabetes.  Her calculated 10 year risk of coronary heart disease is 20 %.  Today's blood pressure is 180/90.  Her blood pressure goal is < 130/80.  Patient Instructions: 1)  Schedule retasure. 2)  Please follow a low salt diet.  See handout. 3)  I am changing your Atenolol to Carvedilol.  So, when you finish your current bottle of Atenolol, you will stop this.  Carvedilol will replace it for blood pressure. 4)  You need a nurse visit in 3 weeks for BP check and ECG. 5)  You can take claritin once daily for sinus drainage and cough.  Make sure you drink plenty of fluids and get plenty of rest.  If your cough symptoms worsen, make a follow up appt. 6)  Schedule follow up in 2 months for blood pressure. Prescriptions: CARVEDILOL 12.5 MG TABS (CARVEDILOL) Take 1 tablet by mouth two times a day for blood pressure (atenolol has been discontinued)  #60 x 5   Entered and Authorized by:   Tereso Newcomer PA-C   Signed by:   Tereso Newcomer PA-C on  12/30/2009   Method used:   Faxed to ...       [facility] (retail)       [address] [place], [place]  [address]       Ph: [phone]       Fax: [phone]   RxID:   [id number]   Laboratory Results   Urine Tests    Routine Urinalysis   Color: yellow Glucose: 250   (Normal Range: Negative) Bilirubin: negative   (Normal Range: Negative) Ketone: negative   (Normal Range: Negative) Spec. Gravity: 1.025   (Normal Range: 1.003-1.035) Blood:  trace-intact   (Normal Range: Negative) pH: 5.0   (Normal Range: 5.0-8.0) Protein: 100   (Normal Range: Negative) Urobilinogen: 0.2   (Normal Range: 0-1) Nitrite: negative   (Normal Range: Negative) Leukocyte Esterace: trace   (Normal Range: Negative)

## 2010-05-05 NOTE — Letter (Signed)
Summary: *HSN Results Follow up  [facility]  [address] [place], [place] [address]   Phone: [phone]  Fax: [phone]      06/05/2009   [name] [address] [place], [place]  [address]   Dear  Ms. Carol Finley,                            ____S.Drinkard,FNP   ____D. Gore,FNP       ____B. McPherson,MD   ____V. Rankins,MD    ____E. Mulberry,MD    ____N. Daphine Deutscher, FNP  ____D. Reche Dixon, MD    ____K. Philipp Deputy, MD    __x__S. Alben Spittle, PA-C     This letter is to inform you that your recent test(s):  _______Pap Smear    ___x____Lab Test     _______X-ray    __x_____ is within acceptable limits  ___x____ requires a medication change  _______ requires a follow-up lab visit  _______ requires a follow-up visit with your provider   Comments:  Kidney function ok.  Sugar is high as we talked about.  You have a bladder infection and someone should have already contacted you to start antibiotics.  If not, call our office.       _________________________________________________________ If you have any questions, please contact our office                     Sincerely,  Tereso Newcomer PA-C [facility]

## 2010-05-05 NOTE — Miscellaneous (Signed)
Summary: Insulin change       Clinical Lists Changes Phone Note Outgoing Call   Summary of Call: notify pt that because she does NOT have a Tree surgeon # that she is not able to get lantus her insulin was changed to novolin C 10 units subcutaneously two times a day (new rx sent to pharmacy) Initial call taken by: Lehman Prom FNP,  February 06, 2010 8:38 AM  Follow-up for Phone Call        pt is aware Follow-up by: Armenia Shannon,  February 06, 2010 9:20 AM    New/Updated Medications: NOVOLIN N 100 UNIT/ML SUSP (INSULIN ISOPHANE HUMAN) 10 units subcutaneously two times a day for diabetes  Medications: Changed medication from LANTUS SOLOSTAR 100 UNIT/ML SOLN (INSULIN GLARGINE) Inect 15 units at bedtime to NOVOLIN N 100 UNIT/ML SUSP (INSULIN ISOPHANE HUMAN) 10 units subcutaneously two times a day for diabetes - Signed Rx of NOVOLIN N 100 UNIT/ML SUSP (INSULIN ISOPHANE HUMAN) 10 units subcutaneously two times a day for diabetes;  #1 month qs x 5;  Signed;  Entered by: Lehman Prom FNP;  Authorized by: Lehman Prom FNP;  Method used: Faxed to [facility], [address] [place], [place]  [address], Ph: [phone], Fax: [phone]    Prescriptions: NOVOLIN N 100 UNIT/ML SUSP (INSULIN ISOPHANE HUMAN) 10 units subcutaneously two times a day for diabetes  #1 month qs x 5   Entered and Authorized by:   Lehman Prom FNP   Signed by:   Lehman Prom FNP on 02/06/2010   Method used:   Faxed to ...       [facility] (retail)       [address] [place], [place]  [address]       Ph: [phone]       Fax: [phone]   RxID:   [id number]

## 2010-05-05 NOTE — Letter (Signed)
Summary: SUGAR READING FOR JUNE  SUGAR READING FOR JUNE   Imported By: Arta Bruce 09/23/2009 11:57:40  _____________________________________________________________________  External Attachment:    Type:   Image     Comment:   External Document

## 2010-05-05 NOTE — Assessment & Plan Note (Signed)
Summary: Blood Pressure  Nurse Visit   Vital Signs:  Patient profile:   60 year old female Pulse rate:   73 / minute BP sitting:   165 / 79  (left arm) Cuff size:   regular  Vitals Entered By: Gaylyn Cheers RN (July 29, 2009 9:46 AM)  Patient Instructions: 1)  No change in blood pressure medications at this time but it is still elevated.  Take medications as ordered by Tereso Newcomer 2)  Follow up for lab visit - blood pressure check in 1 week (afternoon appt).  Bring blood sugar log with you.  Will need to monitor what blood sugar is doing as well because if it is still high then perhaps that is one of the reasons blood pressure is elevated.  Or you may need some additional testing.   Impression & Recommendations:  Problem # 1:  ESSENTIAL HYPERTENSION, BENIGN (ICD-401.1) Still elevated today but pt is still dividing morning  medications  just took some prior to this BP check  advised pt to come in an afternoon (as it is unlikely that she is going to take meds as it has continued to be reinforced by Wende Mott that she take meds in mornings all together) Her updated medication list for this problem includes:    Atenolol 100 Mg Tabs (Atenolol) ..... One tab daily    Hydrochlorothiazide 25 Mg Tabs (Hydrochlorothiazide) ..... One tab daily    Norvasc 10 Mg Tabs (Amlodipine besylate) ..... One tab daily    Lisinopril 40 Mg Tabs (Lisinopril) .Marland Kitchen... Take 1 tablet by mouth two times a day    Catapres 0.3 Mg Tabs (Clonidine hcl) .Marland Kitchen... Take 1 tablet by mouth two times a day  Problem # 2:  DIABETES MELLITUS, TYPE II (ICD-250.00) Elevated Hgba1c in Feb. Will have pt bring blood sugar log into the office on next visit.   ? if not under good control may need to transition to insulin Her updated medication list for this problem includes:    Janumet 50-1000 Mg Tabs (Sitagliptin-metformin hcl) .Marland Kitchen... Take 1 tablet by mouth two times a day    Glucotrol Xl 10 Mg Xr24h-tab (Glipizide) .Marland Kitchen... Take 1  tablet by mouth two times a day    Lisinopril 40 Mg Tabs (Lisinopril) .Marland Kitchen... Take 1 tablet by mouth two times a day    Aspirin 81 Mg Tabs (Aspirin) .Marland Kitchen... Take 1 tablet by mouth once a day  Complete Medication List: 1)  Atenolol 100 Mg Tabs (Atenolol) .... One tab daily 2)  Hydrochlorothiazide 25 Mg Tabs (Hydrochlorothiazide) .... One tab daily 3)  Norvasc 10 Mg Tabs (Amlodipine besylate) .... One tab daily 4)  Janumet 50-1000 Mg Tabs (Sitagliptin-metformin hcl) .... Take 1 tablet by mouth two times a day 5)  Glucotrol Xl 10 Mg Xr24h-tab (Glipizide) .... Take 1 tablet by mouth two times a day 6)  Lisinopril 40 Mg Tabs (Lisinopril) .... Take 1 tablet by mouth two times a day 7)  Catapres 0.3 Mg Tabs (Clonidine hcl) .... Take 1 tablet by mouth two times a day 8)  Naprosyn 500 Mg Tabs (Naproxen) .... Take 1 tablet by mouth two times a day as needed for pain 9)  Flexeril 10 Mg Tabs (Cyclobenzaprine hcl) .... Take 1 tab by mouth at bedtime as needed for muscle spasm 10)  Diflucan 150 Mg Tabs (Fluconazole) .Marland Kitchen.. 1 by mouth x 1 11)  Sidekick Blood Glucose System Devi (Blood gluc meter disp-strips) .... As directed 12)  Glucose Meter Test  Strips (any Brand)  .... Check sugar once a day 13)  Aspirin 81 Mg Tabs (Aspirin) .... Take 1 tablet by mouth once a day  CC: here for BP check, denies chest pain, SOB. Has had some HA's and felt "alittle dizzy" ankles are swollen. Has been taking meds daily, continues to slip her medication takes several @ 8:00 am then usually an hour later takes the rest.   Allergies: No Known Drug Allergies  Orders Added: 1)  Est. Patient Level I [[address]]

## 2010-05-05 NOTE — Letter (Signed)
Summary: BLOOD SUGAR READINGS  BLOOD SUGAR READINGS   Imported By: Arta Bruce 12/15/2009 16:11:40  _____________________________________________________________________  External Attachment:    Type:   Image     Comment:   External Document

## 2010-05-05 NOTE — Progress Notes (Signed)
Summary:  PT NEED NEEDLES  Phone Note Call from Patient Call back at [phone]   Summary of Call: PT IS REQUESTING NEEDLES, SHE REQUESTED ON 10.27.11 AND THE [facility] THEY SAID THEY CAN'T GIVE IT TO HER,THEY SAID SHE HAS TO BUY THE NEEDLES. PLEASE LET HER KNOW WHAT TO DO. Initial call taken by: Domenic Polite,  February 16, 2010 9:34 AM  Follow-up for Phone Call                forward to N. Martin,fnp Follow-up by: Levon Hedger,  February 16, 2010 2:37 PM  Additional Follow-up for Phone Call Additional follow up Details #1::        Pt can buy syringes OTC and she does not needs an Rx if [facility] can't supply them. You can offer her some samples of syringes as we have some available but she will need to plan accordingly for the future in order to buy them as we will not have them on a consecutive basis Additional Follow-up by: Lehman Prom FNP,  February 16, 2010 2:47 PM    Additional Follow-up for Phone Call Additional follow up Details #2::    pt states that she was in the office today and received a box of syringes from [place].  she was informed of above information per Arna Medici. Follow-up by: Levon Hedger,  February 17, 2010 12:25 PM

## 2010-05-05 NOTE — Assessment & Plan Note (Signed)
Summary: bp check/lr  Nurse Visit   Vital Signs:  Patient profile:   60 year old female Pulse rate:   72 / minute Pulse rhythm:   regular Resp:     20 per minute BP sitting:   132 / 76  (left arm) Cuff size:   regular  Vitals Entered By: Dutch Quint RN (January 20, 2010 12:01 PM)  Patient Instructions: 1)  Reviewed with Wende Mott 2)  Blood pressure looks good. 3)  Continue medications as ordered 4)  We will call you with lab results 5)  Follow-up with provider in two months for blood pressure 6)  Call if anything changes or if you have any questions.   Impression & Recommendations:  Problem # 1:  ESSENTIAL HYPERTENSION, BENIGN (ICD-401.1)  Reviewed by Wende Mott  BP good -- 132/76 EKG done 24-hour urine and BMET done Continue meds F/u with provider in 2 months for BP   Her updated medication list for this problem includes:    Carvedilol 12.5 Mg Tabs (Carvedilol) .Marland Kitchen... Take 1 tablet by mouth two times a day for blood pressure (atenolol has been discontinued)    Hydrochlorothiazide 25 Mg Tabs (Hydrochlorothiazide) ..... One tab daily    Norvasc 10 Mg Tabs (Amlodipine besylate) ..... One tab daily    Lisinopril 40 Mg Tabs (Lisinopril) .Marland Kitchen... Take 1 tablet by mouth two times a day    Catapres 0.3 Mg Tabs (Clonidine hcl) .Marland Kitchen... Take 1 tablet by mouth two times a day  Orders: EKG w/ Interpretation ([address])  Complete Medication List: 1)  Carvedilol 12.5 Mg Tabs (Carvedilol) .... Take 1 tablet by mouth two times a day for blood pressure (atenolol has been discontinued) 2)  Hydrochlorothiazide 25 Mg Tabs (Hydrochlorothiazide) .... One tab daily 3)  Norvasc 10 Mg Tabs (Amlodipine besylate) .... One tab daily 4)  Janumet 50-1000 Mg Tabs (Sitagliptin-metformin hcl) .... Take 1 tablet by mouth two times a day 5)  Glucotrol Xl 10 Mg Xr24h-tab (Glipizide) .... Take 1 tablet by mouth once daily 6)  Lisinopril 40 Mg Tabs (Lisinopril) .... Take 1 tablet by mouth two times a day 7)   Catapres 0.3 Mg Tabs (Clonidine hcl) .... Take 1 tablet by mouth two times a day 8)  Naprosyn 500 Mg Tabs (Naproxen) .... Take 1 tablet by mouth two times a day as needed for pain 9)  Flexeril 10 Mg Tabs (Cyclobenzaprine hcl) .... Take 1 tab by mouth at bedtime as needed for muscle spasm 10)  Diflucan 150 Mg Tabs (Fluconazole) .Marland Kitchen.. 1 by mouth x 1 11)  Sidekick Blood Glucose System Devi (Blood gluc meter disp-strips) .... As directed 12)  Glucose Meter Test Strips (any Brand)  .... Check sugar once a day 13)  Aspirin 81 Mg Tabs (Aspirin) .... Take 1 tablet by mouth once a day 14)  Lantus Solostar 100 Unit/ml Soln (Insulin glargine) .... Inect 15 units at bedtime 15)  Proventil Hfa 108 (90 Base) Mcg/act Aers (Albuterol sulfate) .Marland Kitchen.. 1-2 puffs every 6 hours as needed 16)  Tessalon Perles 100 Mg Caps (Benzonatate) .... Take 1 capsule by mouth three times a day as needed for cough 17)  Tussionex Pennkinetic Er 8-10 Mg/44ml Lqcr (Chlorpheniramine-hydrocodone) .Marland Kitchen.. 1 teaspoon by mouth at bedtime as needed for severe cough 18)  Tobrex 0.3 % Soln (Tobramycin sulfate) .Marland Kitchen.. 1-2 drops in each eye every 4 hours for 5-7 days (may stop when eyes clear) (write in spanish)  Other Orders: T-Basic Metabolic Panel [phone]) T-Urine 24hr. Creatinine Clearance (  [phone]) T-24 Hr. Urine Total Protein [phone])   Primary Care Provider:  Tereso Newcomer PA-C  CC:  BP check, EKG, and 24 hour urine and BMET.  History of Present Illness: Took all of her medications today. Asymptomatic.  Brought 24-hour urine to office.   Review of Systems CV:  Complains of leg cramps with exertion; denies bluish discoloration of lips or nails, chest pain or discomfort, difficulty breathing at night, difficulty breathing while lying down, fainting, fatigue, lightheadness, near fainting, palpitations, shortness of breath with exertion, swelling of feet, swelling of hands, and weight gain; sometimes has leg  cramping.   Physical Exam  Lungs:  normal respiratory effort, normal breath sounds, no crackles, and no wheezes.   Heart:  normal rate and regular rhythm.    CC: BP check, EKG, 24 hour urine and BMET Is Patient Diabetic? Yes Pain Assessment Patient in pain? no       Does patient need assistance? Functional Status Self care Ambulation Normal   Allergies: No Known Drug Allergies  Orders Added: 1)  Est. Patient Level I [29562] 2)  EKG w/ Interpretation [93000] 3)  T-Basic Metabolic Panel [[id number]] 4)  T-Urine 24hr. Creatinine Clearance [[id number]] 5)  T-24 Hr. Urine Total Protein [phone]

## 2010-05-07 NOTE — Assessment & Plan Note (Signed)
Summary: Diabetes   Vital Signs:  Patient profile:   60 year old female Weight:      175.4 pounds BMI:     32.20 Temp:     97.0 degrees F oral Pulse rate:   60 / minute Pulse rhythm:   regular Resp:     16 per minute BP sitting:   130 / 70  (left arm) Cuff size:   regular  Vitals Entered By: Levon Hedger (March 23, 2010 9:32 AM)  Nutrition Counseling: Patient's BMI is greater than 25 and therefore counseled on weight management options. CC: follow-up visit, Hypertension Management Is Patient Diabetic? Yes Pain Assessment Patient in pain? no      CBG Result 146 CBG Device ID A  Does patient need assistance? Functional Status Self care Ambulation Normal   Primary Care Provider:  Tereso Newcomer PA-C  CC:  follow-up visit and Hypertension Management.  History of Present Illness:  Pt into the office for f/u on diabetes.  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  She has not been enrolled in the "Diabetic Education Program".  She states understanding of dietary principles and is following her diet appropriately.  No sensory loss is reported.  Self foot exams are not being performed.  She is checking home blood sugars.        Other comments include: checks blood sugars in the morning before breakfast She presents with blood sugar log.        No changes have been made to her treatment plan since last visit.        Her home blood sugars include fasting blood sugars: highest: 157, lowest: 122.    Hypertension History:      She denies headache, chest pain, and palpitations.  She notes no problems with any antihypertensive  medication side effects.  pt is taking meds as ordered.        Positive major cardiovascular risk factors include female age 8 years old or older, diabetes, and hypertension.  Negative major cardiovascular risk factors include non-tobacco-user status.        Further assessment for target organ damage reveals no history of ASHD, cardiac end-organ damage (CHF/LVH), stroke/TIA, peripheral vascular disease, renal insufficiency, or hypertensive retinopathy.     Habits & Providers  Alcohol-Tobacco-Diet     Alcohol drinks/day: 0     Tobacco Status: never  Exercise-Depression-Behavior     STD Risk: never     Drug Use: never     Seat Belt Use: always  Allergies (verified): No Known Drug Allergies  Review of Systems General:  Denies fever. CV:  Denies chest pain or discomfort. Resp:  Denies cough. GI:  Denies abdominal pain, nausea, and vomiting.  Physical Exam  General:  alert.   Head:  normocephalic.   Ears:  ear piercing(s) noted.   Lungs:  normal breath sounds.   Heart:  normal rate and regular rhythm.   Abdomen:  normal bowel sounds.   Neurologic:  alert & oriented X3.   Skin:  color normal.   Psych:  Oriented X3.  Impression & Recommendations:  Problem # 1:  DIABETES MELLITUS, TYPE II (ICD-250.00) blood sugar log given to pt hgba1c = 7.2 Her updated medication list for this problem includes:    Janumet 50-1000 Mg Tabs (Sitagliptin-metformin hcl) .Marland Kitchen... Take 1 tablet by mouth two times a day    Glucotrol Xl 10 Mg Xr24h-tab (Glipizide) .Marland Kitchen... Take 1 tablet by mouth once daily    Lisinopril 40 Mg Tabs (Lisinopril) .Marland Kitchen... Take 1 tablet by mouth two times a day    Aspirin 81 Mg Tabs (Aspirin) .Marland Kitchen... Take 1 tablet by mouth once a day    Novolin N 100 Unit/ml Susp (Insulin isophane human) .Marland KitchenMarland KitchenMarland KitchenMarland Kitchen 10 units subcutaneously two times a day for diabetes  Orders: Capillary Blood Glucose/CBG (82948) Hemoglobin A1C (62952)  Problem # 2:  ESSENTIAL HYPERTENSION, BENIGN (ICD-401.1) BP  is doing much better pt is taking medications as ordered Her updated medication list for this problem includes:    Carvedilol 12.5 Mg Tabs (Carvedilol) .Marland Kitchen... Take 1 tablet by mouth two times a day for blood pressure (atenolol has been discontinued)    Hydrochlorothiazide 25 Mg Tabs (Hydrochlorothiazide) ..... One tab daily    Norvasc 10 Mg Tabs (Amlodipine besylate) ..... One tab daily    Lisinopril 40 Mg Tabs (Lisinopril) .Marland Kitchen... Take 1 tablet by mouth two times a day    Catapres 0.3 Mg Tabs (Clonidine hcl) .Marland Kitchen... Take 1 tablet by mouth two times a day  Problem # 3:  OBESITY (ICD-278.00) down 1 pounds since last visit  encouraged more exercise  Complete Medication List: 1)  Carvedilol 12.5 Mg Tabs (Carvedilol) .... Take 1 tablet by mouth two times a day for blood pressure (atenolol has been discontinued) 2)  Hydrochlorothiazide 25 Mg Tabs (Hydrochlorothiazide) .... One tab daily 3)  Norvasc 10 Mg Tabs (Amlodipine besylate) .... One tab daily 4)  Janumet 50-1000 Mg Tabs (Sitagliptin-metformin hcl) .... Take 1 tablet by mouth two times a day 5)  Glucotrol Xl 10 Mg Xr24h-tab (Glipizide) .... Take 1 tablet by mouth once daily 6)  Lisinopril 40 Mg Tabs (Lisinopril) .... Take 1 tablet by mouth two times a day 7)  Catapres 0.3 Mg Tabs (Clonidine hcl) .... Take 1 tablet by mouth two times a day 8)  Sidekick Blood Glucose System Devi (Blood gluc meter disp-strips) .... As directed 9)  Glucose Meter Test Strips (any Brand)  .... Check sugar once a day 10)  Aspirin 81 Mg Tabs (Aspirin) .... Take 1 tablet by mouth once a day 11)  Novolin N 100 Unit/ml Susp (Insulin isophane human) .Marland Kitchen.. 10 units subcutaneously two times a day for diabetes 12)  Proventil Hfa 108 (90 Base) Mcg/act Aers (Albuterol sulfate) .Marland Kitchen.. 1-2 puffs every 6 hours as needed 13)  Tessalon Perles 100 Mg Caps (Benzonatate) .... Take 1 capsule by mouth three times a day as needed for cough 14)  Insulin Syringe 29g X 1/2" 1 Ml Misc (Insulin  syringe-needle u-100) .... Use with lantus insulin daily  Diabetes Management Assessment/Plan:      Her blood pressure goal is < 130/80.    Hypertension Assessment/Plan:      The patient's hypertensive risk group is category C: Target organ damage and/or diabetes.  Her calculated 10 year risk of coronary heart disease is 13 %.  Today's blood pressure is 130/70.  Her blood pressure goal is < 130/80.  Patient Instructions: 1)  Schedule an appointment for fasting labs - will need lipids, tsh. 2)  no food after midnight before this  visit.  You can drink water. 3)  Blood pressure - doing much better today.  Keep taking your medications as ordered 4)  Diabetes - Your Hgba1c = 7.2 5)  The goal is less than 7 but it is doing much better.  Keep taking your diabetes medications. 6)  Call for a follow up in 3 months for diabetes or sooner if necessary 7)  will need u/a, foot check, cbg, hgba1c   Orders Added: 1)  Capillary Blood Glucose/CBG [82948] 2)  Est. Patient Level III [[address]] 3)  Hemoglobin A1C [83036]    Laboratory Results   Blood Tests   Date/Time Received: March 23, 2010 10:09 AM   HGBA1C: 7.2%   (Normal Range: Non-Diabetic - 3-6%   Control Diabetic - 6-8%) CBG Random:: 146mg /dL

## 2010-05-07 NOTE — Letter (Signed)
Summary: BLOOD SUGAR READINGS  BLOOD SUGAR READINGS   Imported By: Arta Bruce 03/23/2010 14:12:09  _____________________________________________________________________  External Attachment:    Type:   Image     Comment:   External Document

## 2010-05-07 NOTE — Letter (Signed)
Summary: Lipid Letter  [facility]  [address] [place], [place] [address]   Phone: [phone]  Fax: [phone]    04/01/2010  Carol Finley [address] [place], [place]  [address]  Dear Lafonda Mosses:  We have carefully reviewed your last lipid profile from 03/31/2010 and the results are noted below with a summary of recommendations for lipid management.    Cholesterol:       175     Goal: less than 200   HDL "good" Cholesterol:   47     Goal: greater than 40   LDL "bad" Cholesterol:   102     Goal: less than 70   Triglycerides:       130     Goal: less than 150    Cholesterol labs done during recent office visit shows that your "bad" cholesterol is just a little bit high but overall your cholesterol is ok.    Current Medications: 1)    Carvedilol 12.5 Mg Tabs (Carvedilol) .... Take 1 tablet by mouth two times a day for blood pressure (atenolol has been discontinued) 2)    Hydrochlorothiazide 25 Mg Tabs (Hydrochlorothiazide) .... One tab daily 3)    Norvasc 10 Mg Tabs (Amlodipine besylate) .... One tab daily 4)    Janumet 50-1000 Mg Tabs (Sitagliptin-metformin hcl) .... Take 1 tablet by mouth two times a day 5)    Glucotrol Xl 10 Mg Xr24h-tab (Glipizide) .... Take 1 tablet by mouth once daily 6)    Lisinopril 40 Mg Tabs (Lisinopril) .... Take 1 tablet by mouth two times a day 7)    Catapres 0.3 Mg Tabs (Clonidine hcl) .... Take 1 tablet by mouth two times a day 8)    Sidekick Blood Glucose System  Devi (Blood gluc meter disp-strips) .... As directed 9)    Glucose Meter Test Strips (any Brand)  .... Check sugar once a day 10)    Aspirin 81 Mg Tabs (Aspirin) .... Take 1 tablet by mouth once a day 11)    Novolin N 100 Unit/ml Susp (Insulin isophane human) .Marland Kitchen.. 10 units subcutaneously two times a day for diabetes 12)    Proventil Hfa 108 (90 Base) Mcg/act Aers (Albuterol sulfate) .Marland Kitchen.. 1-2 puffs every 6 hours as needed 13)     Tessalon Perles 100 Mg Caps (Benzonatate) .... Take 1 capsule by mouth three times a day as needed for cough 14)    Insulin Syringe 29g X 1/2" 1 Ml Misc (Insulin syringe-needle u-100) .... Use with lantus insulin daily  If you have any questions, please call. We appreciate being able to work with you.   Sincerely,    Lehman Prom, FNP [facility]

## 2010-05-13 NOTE — Letter (Signed)
Summary: NUTRITION SUMMARY/SUSIE  NUTRITION SUMMARY/SUSIE   Imported By: Arta Bruce 05/07/2010 16:54:29  _____________________________________________________________________  External Attachment:    Type:   Image     Comment:   External Document

## 2010-05-13 NOTE — Assessment & Plan Note (Signed)
Summary: Acute - Tinea Pedis   Vital Signs:  Patient profile:   60 year old female Weight:      174.31 pounds Temp:     97.8 degrees F oral Pulse rate:   70 / minute Pulse rhythm:   regular Resp:     16 per minute BP sitting:   108 / 64  (left arm) Cuff size:   regular  Vitals Entered By: Hale Drone CMA (May 04, 2010 12:11 PM) CC: F/u on DM, blisters and pain between her toes. 8-10 days ago her left foot had blisters and lesions. It was very painful for her to walk. Has been cleaning her feet w/hydroperoxide and it has helped. Toes were swollen x4 days. , Hypertension Management Is Patient Diabetic? Yes Pain Assessment Patient in pain? yes     Location: left foot Intensity: 5 Type: throbbing Onset of pain  Intermittent CBG Result 180 CBG Device ID A non fasting  Does patient need assistance? Functional Status Self care Ambulation Normal   Primary Care Provider:  Tereso Newcomer PA-C  CC:  F/u on DM, blisters and pain between her toes. 8-10 days ago her left foot had blisters and lesions. It was very painful for her to walk. Has been cleaning her feet w/hydroperoxide and it has helped. Toes were swollen x4 days. , and Hypertension Management.  History of Present Illness:  Pt into the office for f/u on diabetes also presents with blisters and sores between her toes started about 8 days ago She does self foot exams at home.  On day she iniitally saw the areas she just monitored but then the next day she applied some cream and cleaned with peroxide. Denies any pain +itching  Obesity - down 1 pounds since her last visit here.  [employer] present today with pt  Anticoagulation Management History:      Positive risk factors for bleeding include presence of serious comorbidities.  Negative risk factors for bleeding include an age less than 66 years old and no history of CVA/TIA.  The bleeding index is 'intermediate risk'.  Positive CHADS2 values include History of HTN  and History of Diabetes.  Negative CHADS2 values include Age > 2 years old and Prior Stroke/CVA/TIA.    Diabetes Management History:      The patient is a 60 years old female who comes in for evaluation of DM Type 2.  She has not been enrolled in the "Diabetic Education Program".  She states understanding of dietary principles and is following her diet appropriately.  No sensory loss is reported.  Self foot exams are being performed.  She is checking home blood sugars.        Hypoglycemic symptoms are not occurring.  No hyperglycemic symptoms are reported.  Other comments include: pt presents today with insulin and has questions on how she is to take. Med changed on last visit to two times a day .        Symptoms which suggest diabetic complications include ulcerations or sores.  Other questions/concerns include: sores on feet.    Hypertension History:      She denies headache, chest pain, and palpitations.        Positive major cardiovascular risk factors include female age 42 years old or older, diabetes, and hypertension.  Negative major cardiovascular risk factors include non-tobacco-user status.        Further assessment for target organ damage reveals no history of ASHD, cardiac end-organ damage (CHF/LVH), stroke/TIA, peripheral vascular  disease, renal insufficiency, or hypertensive retinopathy.    Diabetic Foot Exam Foot Inspection Is there a history of a foot ulcer?              No Is there a foot ulcer now?              No Can the patient see the bottom of their feet?          No Are the shoes appropriate in style and fit?          No Is there swelling or an abnormal foot shape?          No Are the toenails long?                Yes Are the toenails thick?                Yes Are the toenails ingrown?              No Is there heavy callous build-up?              No Is there pain in the calf muscle (Intermittent claudication) when walking?    NoIs there a claw toe deformity?               No Is there elevated skin temperature?            No Is there limited ankle dorsiflexion?            No Is there foot or ankle muscle weakness?            No  Diabetic Foot Care Education Patient educated on appropriate care of diabetic feet.  Pulse Check          Right Foot          Left Foot Dorsalis Pedis:        normal            normal    10-g (5.07) Semmes-Weinstein Monofilament Test Performed by: Hale Drone CMA          Right Foot          Left Foot Visual Inspection     normal          Test Control      normal         normal Site 1         abnormal         normal Site 2         normal         normal Site 3         normal         abnormal Site 4         normal         normal Site 5         normal         normal Site 6         normal         abnormal Site 7         normal         abnormal Site 8         abnormal         normal Site 9         abnormal         abnormal Site 10         normal  normal  Impression      normal         normal    Current Medications (verified): 1)  Carvedilol 12.5 Mg Tabs (Carvedilol) .... Take 1 Tablet By Mouth Two Times A Day For Blood Pressure (Atenolol Has Been Discontinued) 2)  Hydrochlorothiazide 25 Mg Tabs (Hydrochlorothiazide) .... One Tab Daily 3)  Norvasc 10 Mg Tabs (Amlodipine Besylate) .... One Tab Daily 4)  Janumet 50-1000 Mg Tabs (Sitagliptin-Metformin Hcl) .... Take 1 Tablet By Mouth Two Times A Day 5)  Glucotrol Xl 10 Mg Xr24h-Tab (Glipizide) .... Take 1 Tablet By Mouth Once Daily 6)  Lisinopril 40 Mg Tabs (Lisinopril) .... Take 1 Tablet By Mouth Two Times A Day 7)  Catapres 0.3 Mg Tabs (Clonidine Hcl) .... Take 1 Tablet By Mouth Two Times A Day 8)  Sidekick Blood Glucose System  Devi (Blood Gluc Meter Disp-Strips) .... As Directed 9)  Glucose Meter Test Strips (Any Brand) .... Check Sugar Once A Day 10)  Aspirin 81 Mg Tabs (Aspirin) .... Take 1 Tablet By Mouth Once A Day 11)  Novolin N 100 Unit/ml Susp (Insulin  Isophane Human) .Marland Kitchen.. 10 Units Subcutaneously Two Times A Day For Diabetes 12)  Proventil Hfa 108 (90 Base) Mcg/act Aers (Albuterol Sulfate) .Marland Kitchen.. 1-2 Puffs Every 6 Hours As Needed 13)  Tessalon Perles 100 Mg Caps (Benzonatate) .... Take 1 Capsule By Mouth Three Times A Day As Needed For Cough 14)  Insulin Syringe 29g X 1/2" 1 Ml Misc (Insulin Syringe-Needle U-100) .... Use With Lantus Insulin Daily  Allergies (verified): No Known Drug Allergies  Review of Systems CV:  Denies chest pain or discomfort. Resp:  Denies cough. GI:  Denies abdominal pain, nausea, and vomiting. Derm:  Complains of itching, lesion(s), and rash.  Physical Exam  General:  alert.   Head:  normocephalic.   Ears:  ear piercing(s) noted.   Abdomen:  normal bowel sounds.   Msk:  up to the exam table Neurologic:  alert & oriented X3.   Skin:  left foot - between all toes with sloughing skin, moist Psych:  Oriented X3.    Diabetes Management Exam:    Foot Exam (with socks and/or shoes not present):       Sensory-Monofilament:          Left foot: normal          Right foot: normal   Impression & Recommendations:  Problem # 1:  TINEA PEDIS (ICD-110.4)  reviewed dx with pt  Her updated medication list for this problem includes:    Lotrisone 1-0.05 % Crea (Clotrimazole-betamethasone) .Marland Kitchen... Apply to affected area two times a day  Problem # 2:  DIABETES MELLITUS, TYPE II (ICD-250.00) reviewed with pt that she needs to take insulin two times a day as ordered Her updated medication list for this problem includes:    Janumet 50-1000 Mg Tabs (Sitagliptin-metformin hcl) .Marland Kitchen... Take 1 tablet by mouth two times a day    Glucotrol Xl 10 Mg Xr24h-tab (Glipizide) .Marland Kitchen... Take 1 tablet by mouth once daily    Lisinopril 40 Mg Tabs (Lisinopril) .Marland Kitchen... Take 1 tablet by mouth two times a day    Aspirin 81 Mg Tabs (Aspirin) .Marland Kitchen... Take 1 tablet by mouth once a day    Novolin N 100 Unit/ml Susp (Insulin isophane human) .Marland KitchenMarland KitchenMarland KitchenMarland Kitchen 10  units subcutaneously two times a day for diabetes  Orders: Capillary Blood Glucose/CBG (19147) UA Dipstick w/o Micro (manual) (82956)  Problem # 3:  ESSENTIAL HYPERTENSION,  BENIGN (ICD-401.1)  Her updated medication list for this problem includes:    Carvedilol 12.5 Mg Tabs (Carvedilol) .Marland Kitchen... Take 1 tablet by mouth two times a day for blood pressure (atenolol has been discontinued)    Hydrochlorothiazide 25 Mg Tabs (Hydrochlorothiazide) ..... One tab daily    Norvasc 10 Mg Tabs (Amlodipine besylate) ..... One tab daily    Lisinopril 40 Mg Tabs (Lisinopril) .Marland Kitchen... Take 1 tablet by mouth two times a day    Catapres 0.3 Mg Tabs (Clonidine hcl) .Marland Kitchen... Take 1 tablet by mouth two times a day  Problem # 4:  OBESITY (ICD-278.00) down 1 pound since last visit pt to keep up efforts to decrease weight  Complete Medication List: 1)  Carvedilol 12.5 Mg Tabs (Carvedilol) .... Take 1 tablet by mouth two times a day for blood pressure (atenolol has been discontinued) 2)  Hydrochlorothiazide 25 Mg Tabs (Hydrochlorothiazide) .... One tab daily 3)  Norvasc 10 Mg Tabs (Amlodipine besylate) .... One tab daily 4)  Janumet 50-1000 Mg Tabs (Sitagliptin-metformin hcl) .... Take 1 tablet by mouth two times a day 5)  Glucotrol Xl 10 Mg Xr24h-tab (Glipizide) .... Take 1 tablet by mouth once daily 6)  Lisinopril 40 Mg Tabs (Lisinopril) .... Take 1 tablet by mouth two times a day 7)  Catapres 0.3 Mg Tabs (Clonidine hcl) .... Take 1 tablet by mouth two times a day 8)  Sidekick Blood Glucose System Devi (Blood gluc meter disp-strips) .... As directed 9)  Glucose Meter Test Strips (any Brand)  .... Check sugar once a day 10)  Aspirin 81 Mg Tabs (Aspirin) .... Take 1 tablet by mouth once a day 11)  Novolin N 100 Unit/ml Susp (Insulin isophane human) .Marland Kitchen.. 10 units subcutaneously two times a day for diabetes 12)  Proventil Hfa 108 (90 Base) Mcg/act Aers (Albuterol sulfate) .Marland Kitchen.. 1-2 puffs every 6 hours as needed 13)   Tessalon Perles 100 Mg Caps (Benzonatate) .... Take 1 capsule by mouth three times a day as needed for cough 14)  Insulin Syringe 29g X 1/2" 1 Ml Misc (Insulin syringe-needle u-100) .... Use with lantus insulin daily 15)  Lotrisone 1-0.05 % Crea (Clotrimazole-betamethasone) .... Apply to affected area two times a day  Anticoagulation Management Assessment/Plan:             Diabetes Management Assessment/Plan:      Her blood pressure goal is < 130/80.    Hypertension Assessment/Plan:      The patient's hypertensive risk group is category C: Target organ damage and/or diabetes.  Her calculated 10 year risk of coronary heart disease is 11 %.  Today's blood pressure is 108/64.  Her blood pressure goal is < 130/80.  Patient Instructions: 1)  Insulin - Take insulin two times a day with breakfast and dinner. 2)  You have a fungus infection in your feet.  Use the cream two times a day until healed. May take up to 4 weeks to completely heal. Prescription has been sent to [facility] pharmacy 3)  Call for a follow up with provider in 3 months for diabetes. Prescriptions: LOTRISONE 1-0.05 % CREA (CLOTRIMAZOLE-BETAMETHASONE) Apply to affected area two times a day  #45gm x 1   Entered and Authorized by:   Lehman Prom FNP   Signed by:   Lehman Prom FNP on 05/04/2010   Method used:   Faxed to ...       [facility] (retail)       [address]  [place], [place]  [address]       Ph: [phone]       Fax: [phone]   RxID:   [id number]    Orders Added: 1)  Capillary Blood Glucose/CBG [82948] 2)  Est. Patient Level III [43329] 3)  UA Dipstick w/o Micro (manual) [81002]     Diabetic Foot Exam Foot Inspection Is there a history of a foot ulcer?              No Is there a foot ulcer now?              No Can the patient see the bottom of their feet?          No Are the shoes appropriate in style and fit?          No Is there  swelling or an abnormal foot shape?          No Are the toenails long?                Yes Are the toenails thick?                Yes Are the toenails ingrown?              No Is there heavy callous build-up?              No Is there pain in the calf muscle (Intermittent claudication) when walking?    NoIs there a claw toe deformity?              No Is there elevated skin temperature?            No Is there limited ankle dorsiflexion?            No Is there foot or ankle muscle weakness?            No  Diabetic Foot Care Education Patient educated on appropriate care of diabetic feet.  Pulse Check          Right Foot          Left Foot Dorsalis Pedis:        normal            normal    10-g (5.07) Semmes-Weinstein Monofilament Test Performed by: Hale Drone CMA          Right Foot          Left Foot Visual Inspection     normal          Test Control      normal         normal Site 1         abnormal         normal Site 2         normal         normal Site 3         normal         abnormal Site 4         normal         normal Site 5         normal         normal Site 6         normal         abnormal Site 7         normal         abnormal Site 8  abnormal         normal Site 9         abnormal         abnormal Site 10         normal         normal  Impression      normal         normal   Laboratory Results   Urine Tests  Date/Time Received: May 04, 2010 12:35 PM   Routine Urinalysis   Color: lt. yellow Glucose: negative   (Normal Range: Negative) Bilirubin: negative   (Normal Range: Negative) Ketone: negative   (Normal Range: Negative) Spec. Gravity: 1.015   (Normal Range: 1.003-1.035) Blood: negative   (Normal Range: Negative) pH: 5.5   (Normal Range: 5.0-8.0) Protein: negative   (Normal Range: Negative) Urobilinogen: 0.2   (Normal Range: 0-1) Nitrite: negative   (Normal Range: Negative) Leukocyte Esterace: negative   (Normal Range: Negative)      Blood Tests     CBG Random:: 180mg /dL

## 2010-05-13 NOTE — Letter (Signed)
Summary: Handout Printed  Printed Handout:  - Athlete's Foot

## 2010-06-19 ENCOUNTER — Encounter (INDEPENDENT_AMBULATORY_CARE_PROVIDER_SITE_OTHER): Payer: Self-pay | Admitting: Nurse Practitioner

## 2010-06-19 ENCOUNTER — Encounter: Payer: Self-pay | Admitting: Nurse Practitioner

## 2010-06-19 DIAGNOSIS — J309 Allergic rhinitis, unspecified: Secondary | ICD-10-CM | POA: Insufficient documentation

## 2010-06-19 LAB — CONVERTED CEMR LAB
Blood in Urine, dipstick: NEGATIVE
Hgb A1c MFr Bld: 6.7 %
Ketones, urine, test strip: NEGATIVE
Nitrite: NEGATIVE
Protein, U semiquant: NEGATIVE
Specific Gravity, Urine: 1.01
Urobilinogen, UA: 0.2

## 2010-06-23 NOTE — Assessment & Plan Note (Signed)
Summary: Diabetes   Vital Signs:  Patient profile:   60 year old female Menstrual status:  postmenopausal Weight:      172.8 pounds BMI:     31.72 Temp:     97.8 degrees F oral Pulse rate:   65 / minute Pulse rhythm:   regular Resp:     16 per minute BP sitting:   112 / 69  (left arm) Cuff size:   regular  Vitals Entered By: Levon Hedger (June 19, 2010 11:27 AM)  Nutrition Counseling: Patient's BMI is greater than 25 and therefore counseled on weight management options. CC: follow-up visit DM, HTN, Hypertension Management Is Patient Diabetic? No Pain Assessment Patient in pain? no       Does patient need assistance? Functional Status Self care Ambulation Normal     Menstrual Status postmenopausal Last PAP Result  Specimen Adequacy: Satisfactory for evaluation.   Interpretation/Result:Negative for intraepithelial Lesion or Malignancy.      Primary Care Provider:  Tereso Newcomer PA-C  CC:  follow-up visit DM, HTN, and Hypertension Management.  History of Present Illness: Pt into the office for f/u on diabetes  No acute complaints today Presents with her 3 grandchildren of which she cares for during the day  Diabetes Management History:      The patient is a 60 years old female who comes in for evaluation of DM Type 2.  She has not been enrolled in the "Diabetic Education Program".  She states understanding of dietary principles and is following her diet appropriately.  No sensory loss is reported.  Self foot exams are not being performed.  She is checking home blood sugars.  She says that she is exercising.        Hypoglycemic symptoms are not occurring.  No hyperglycemic symptoms are reported.  Other comments include: pt is taking meds as ordered.        No changes have been made to her treatment plan since last visit.    Hypertension History:      She denies headache, chest pain, and palpitations.  She notes no problems with any antihypertensive medication side  effects.  pt is taking meds ordered.        Positive major cardiovascular risk factors include female age 30 years old or older, diabetes, and hypertension.  Negative major cardiovascular risk factors include non-tobacco-user status.        Further assessment for target organ damage reveals no history of ASHD, cardiac end-organ damage (CHF/LVH), stroke/TIA, peripheral vascular disease, renal insufficiency, or hypertensive retinopathy.     Habits & Providers  Alcohol-Tobacco-Diet     Alcohol drinks/day: 0     Tobacco Status: never  Exercise-Depression-Behavior     Does Patient Exercise: yes     Exercise Counseling: not indicated; exercise is adequate     STD Risk: never     Drug Use: never     Seat Belt Use: always  Medications Prior to Update: 1)  Carvedilol 12.5 Mg Tabs (Carvedilol) .... Take 1 Tablet By Mouth Two Times A Day For Blood Pressure (Atenolol Has Been Discontinued) 2)  Hydrochlorothiazide 25 Mg Tabs (Hydrochlorothiazide) .... One Tab Daily 3)  Norvasc 10 Mg Tabs (Amlodipine Besylate) .... One Tab Daily 4)  Janumet 50-1000 Mg Tabs (Sitagliptin-Metformin Hcl) .... Take 1 Tablet By Mouth Two Times A Day 5)  Glucotrol Xl 10 Mg Xr24h-Tab (Glipizide) .... Take 1 Tablet By Mouth Once Daily 6)  Lisinopril 40 Mg Tabs (Lisinopril) .... Take 1 Tablet  By Mouth Two Times A Day 7)  Catapres 0.3 Mg Tabs (Clonidine Hcl) .... Take 1 Tablet By Mouth Two Times A Day 8)  Sidekick Blood Glucose System  Devi (Blood Gluc Meter Disp-Strips) .... As Directed 9)  Glucose Meter Test Strips (Any Brand) .... Check Sugar Once A Day 10)  Aspirin 81 Mg Tabs (Aspirin) .... Take 1 Tablet By Mouth Once A Day 11)  Novolin N 100 Unit/ml Susp (Insulin Isophane Human) .Marland Kitchen.. 10 Units Subcutaneously Two Times A Day For Diabetes 12)  Proventil Hfa 108 (90 Base) Mcg/act Aers (Albuterol Sulfate) .Marland Kitchen.. 1-2 Puffs Every 6 Hours As Needed 13)  Tessalon Perles 100 Mg Caps (Benzonatate) .... Take 1 Capsule By Mouth Three  Times A Day As Needed For Cough 14)  Insulin Syringe 29g X 1/2" 1 Ml Misc (Insulin Syringe-Needle U-100) .... Use With Lantus Insulin Daily 15)  Lotrisone 1-0.05 % Crea (Clotrimazole-Betamethasone) .... Apply To Affected Area Two Times A Day  Current Medications (verified): 1)  Carvedilol 12.5 Mg Tabs (Carvedilol) .... Take 1 Tablet By Mouth Two Times A Day For Blood Pressure (Atenolol Has Been Discontinued) 2)  Hydrochlorothiazide 25 Mg Tabs (Hydrochlorothiazide) .... One Tab Daily 3)  Norvasc 10 Mg Tabs (Amlodipine Besylate) .... One Tab Daily 4)  Janumet 50-1000 Mg Tabs (Sitagliptin-Metformin Hcl) .... Take 1 Tablet By Mouth Two Times A Day 5)  Glucotrol Xl 10 Mg Xr24h-Tab (Glipizide) .... Take 1 Tablet By Mouth Once Daily 6)  Lisinopril 40 Mg Tabs (Lisinopril) .... Take 1 Tablet By Mouth Two Times A Day 7)  Catapres 0.3 Mg Tabs (Clonidine Hcl) .... Take 1 Tablet By Mouth Two Times A Day 8)  Sidekick Blood Glucose System  Devi (Blood Gluc Meter Disp-Strips) .... As Directed 9)  Glucose Meter Test Strips (Any Brand) .... Check Sugar Once A Day 10)  Aspirin 81 Mg Tabs (Aspirin) .... Take 1 Tablet By Mouth Once A Day 11)  Novolin N 100 Unit/ml Susp (Insulin Isophane Human) .Marland Kitchen.. 10 Units Subcutaneously Two Times A Day For Diabetes 12)  Proventil Hfa 108 (90 Base) Mcg/act Aers (Albuterol Sulfate) .Marland Kitchen.. 1-2 Puffs Every 6 Hours As Needed 13)  Tessalon Perles 100 Mg Caps (Benzonatate) .... Take 1 Capsule By Mouth Three Times A Day As Needed For Cough 14)  Insulin Syringe 29g X 1/2" 1 Ml Misc (Insulin Syringe-Needle U-100) .... Use With Lantus Insulin Daily 15)  Lotrisone 1-0.05 % Crea (Clotrimazole-Betamethasone) .... Apply To Affected Area Two Times A Day  Allergies (verified): No Known Drug Allergies  Social History: Does Patient Exercise:  yes  Review of Systems General:  Denies fever. CV:  Denies fatigue. Resp:  Denies cough. GI:  Denies abdominal pain, nausea, and vomiting. Derm:   Denies itching and rash; improved. Neuro:  Denies headaches. Allergy:  Complains of seasonal allergies.  Physical Exam  General:  alert.   Head:  normocephalic.   Lungs:  normal breath sounds.   Heart:  normal rate and regular rhythm.   Msk:  up to the exam table Neurologic:  alert & oriented X3.    Diabetes Management Exam:       Nails:          Left foot: thickened          Right foot: thickened   Impression & Recommendations:  Problem # 1:  DIABETES MELLITUS, TYPE II (ICD-250.00) Hgba1c = 6.7 doing well Pt to keep taking current meds Her updated medication list for this problem includes:  Janumet 50-1000 Mg Tabs (Sitagliptin-metformin hcl) .Marland Kitchen... Take 1 tablet by mouth two times a day    Glucotrol Xl 10 Mg Xr24h-tab (Glipizide) .Marland Kitchen... Take 1 tablet by mouth once daily    Lisinopril 40 Mg Tabs (Lisinopril) .Marland Kitchen... Take 1 tablet by mouth two times a day    Aspirin 81 Mg Tabs (Aspirin) .Marland Kitchen... Take 1 tablet by mouth once a day    Novolin N 100 Unit/ml Susp (Insulin isophane human) .Marland KitchenMarland KitchenMarland KitchenMarland Kitchen 10 units subcutaneously two times a day for diabetes  Orders: UA Dipstick w/o Micro (manual) (13086) Capillary Blood Glucose/CBG (82948) Hemoglobin A1C (83036)  Problem # 2:  ESSENTIAL HYPERTENSION, BENIGN (ICD-401.1) BP stable continue current meds DASH diet Her updated medication list for this problem includes:    Carvedilol 12.5 Mg Tabs (Carvedilol) .Marland Kitchen... Take 1 tablet by mouth two times a day for blood pressure (atenolol has been discontinued)    Hydrochlorothiazide 25 Mg Tabs (Hydrochlorothiazide) ..... One tablet by mouth daily    Norvasc 10 Mg Tabs (Amlodipine besylate) ..... One tab daily    Lisinopril 40 Mg Tabs (Lisinopril) .Marland Kitchen... Take 1 tablet by mouth two times a day    Catapres 0.3 Mg Tabs (Clonidine hcl) .Marland Kitchen... Take 1 tablet by mouth two times a day  Problem # 3:  TINEA PEDIS (ICD-110.4) Assessment: Improved improved Her updated medication list for this problem includes:     Lotrisone 1-0.05 % Crea (Clotrimazole-betamethasone) .Marland Kitchen... Apply to affected area two times a day  Problem # 4:  OBESITY (ICD-278.00) down 2 pounds since last visit pt still waking as ordered  Problem # 5:  ALLERGIC RHINITIS (ICD-477.9)  Her updated medication list for this problem includes:    Loratadine 10 Mg Tabs (Loratadine) ..... One tablet by mouth daily as needed for allergies  Complete Medication List: 1)  Carvedilol 12.5 Mg Tabs (Carvedilol) .... Take 1 tablet by mouth two times a day for blood pressure (atenolol has been discontinued) 2)  Hydrochlorothiazide 25 Mg Tabs (Hydrochlorothiazide) .... One tablet by mouth daily 3)  Norvasc 10 Mg Tabs (Amlodipine besylate) .... One tab daily 4)  Janumet 50-1000 Mg Tabs (Sitagliptin-metformin hcl) .... Take 1 tablet by mouth two times a day 5)  Glucotrol Xl 10 Mg Xr24h-tab (Glipizide) .... Take 1 tablet by mouth once daily 6)  Lisinopril 40 Mg Tabs (Lisinopril) .... Take 1 tablet by mouth two times a day 7)  Catapres 0.3 Mg Tabs (Clonidine hcl) .... Take 1 tablet by mouth two times a day 8)  Sidekick Blood Glucose System Devi (Blood gluc meter disp-strips) .... As directed 9)  Glucose Meter Test Strips (any Brand)  .... Check sugar once a day 10)  Aspirin 81 Mg Tabs (Aspirin) .... Take 1 tablet by mouth once a day 11)  Novolin N 100 Unit/ml Susp (Insulin isophane human) .Marland Kitchen.. 10 units subcutaneously two times a day for diabetes 12)  Proventil Hfa 108 (90 Base) Mcg/act Aers (Albuterol sulfate) .Marland Kitchen.. 1-2 puffs every 6 hours as needed 13)  Insulin Syringe 29g X 1/2" 1 Ml Misc (Insulin syringe-needle u-100) .... Use with lantus insulin daily 14)  Lotrisone 1-0.05 % Crea (Clotrimazole-betamethasone) .... Apply to affected area two times a day 15)  Loratadine 10 Mg Tabs (Loratadine) .... One tablet by mouth daily as needed for allergies  Diabetes Management Assessment/Plan:      Her blood pressure goal is < 130/80.    Hypertension  Assessment/Plan:      The patient's hypertensive risk group is category C:  Target organ damage and/or diabetes.  Her calculated 10 year risk of coronary heart disease is 13 %.  Today's blood pressure is 112/69.  Her blood pressure goal is < 130/80.  Patient Instructions: 1)  All your medications have been sent to the pharmacy for refills 2)  Blood sugar and blood pressure is doing great.  Keep taking your medications as ordered 3)  Follow up with provider in 6 months for diabetes. Prescriptions: LORATADINE 10 MG TABS (LORATADINE) One tablet by mouth daily as needed for allergies  #30 x 5   Entered and Authorized by:   Lehman Prom FNP   Signed by:   Lehman Prom FNP on 06/19/2010   Method used:   Print then Give to Patient   RxID:   [id number] GLUCOSE METER TEST STRIPS (ANY BRAND) check sugar once a day  #1 mo supply x 11   Entered and Authorized by:   Lehman Prom FNP   Signed by:   Lehman Prom FNP on 06/19/2010   Method used:   Faxed to ...       [facility] - Pharmac (retail)       [address] [place], [place]  [address]       Ph: [phone]       Fax: [phone]   RxID:   [id number] NOVOLIN N 100 UNIT/ML SUSP (INSULIN ISOPHANE HUMAN) 10 units subcutaneously two times a day for diabetes  #1 month qs x 11   Entered and Authorized by:   Lehman Prom FNP   Signed by:   Lehman Prom FNP on 06/19/2010   Method used:   Faxed to ...       [facility] - Pharmac (retail)       [address] [place], [place]  [address]       Ph: [phone]       Fax: [phone]   RxID:   [id number] ASPIRIN 81 MG TABS (ASPIRIN) Take 1 tablet by mouth once a day  #30 x 11   Entered and Authorized by:   Lehman Prom FNP   Signed by:   Lehman Prom FNP on 06/19/2010   Method used:   Faxed to ...       [facility] - Pharmac (retail)       [address] [place], [place]  [address]       Ph: [phone]       Fax: [phone]   RxID:   [id number] CATAPRES 0.3 MG TABS (CLONIDINE HCL) Take 1 tablet by mouth two times a day  #60 x 11   Entered and Authorized by:   Lehman Prom FNP   Signed by:   Lehman Prom FNP on 06/19/2010   Method used:   Faxed to ...       [facility] - Pharmac (retail)       [address] [place], [place]  [address]       Ph: [phone]       Fax: [phone]   RxID:   [id number] LISINOPRIL 40 MG TABS (LISINOPRIL) Take 1 tablet by mouth two times a day  #60 x 11   Entered and Authorized by:   Lehman Prom FNP   Signed by:   Lehman Prom FNP on 06/19/2010   Method used:  Faxed to ...       [facility] - Pharmac (retail)       [address] [place], [place]  [address]       Ph: [phone]       Fax: [phone]   RxID:   [id number] GLUCOTROL XL 10 MG XR24H-TAB (GLIPIZIDE) Take 1 tablet by mouth once daily  #30 x 11   Entered and Authorized by:   Lehman Prom FNP   Signed by:   Lehman Prom FNP on 06/19/2010   Method used:   Faxed to ...       [facility] - Pharmac (retail)       [address] [place], [place]  [address]       Ph: [phone]       Fax: [phone]   RxID:   [id number] JANUMET 50-1000 MG TABS (SITAGLIPTIN-METFORMIN HCL) Take 1 tablet by mouth two times a day  #60 x 11   Entered and Authorized by:   Lehman Prom FNP   Signed by:   Lehman Prom FNP on 06/19/2010   Method used:   Faxed to ...       [facility] - Pharmac (retail)       [address] [place], [place]  [address]       Ph: [phone]       Fax: [phone]   RxID:   [id number] NORVASC 10 MG TABS (AMLODIPINE BESYLATE) one tab daily  #30 x 11   Entered and Authorized by:   Lehman Prom FNP   Signed by:   Lehman Prom  FNP on 06/19/2010   Method used:   Faxed to ...       [facility] - Pharmac (retail)       [address] [place], [place]  [address]       Ph: [phone]       Fax: [phone]   RxID:   [id number] HYDROCHLOROTHIAZIDE 25 MG TABS (HYDROCHLOROTHIAZIDE) One tablet by mouth daily  #30 x 11   Entered and Authorized by:   Lehman Prom FNP   Signed by:   Lehman Prom FNP on 06/19/2010   Method used:   Faxed to ...       [facility] - Pharmac (retail)       [address] [place], [place]  [address]       Ph: [phone]       Fax: [phone]   RxID:   [id number] CARVEDILOL 12.5 MG TABS (CARVEDILOL) Take 1 tablet by mouth two times a day for blood pressure (atenolol has been discontinued)  #60 x 11   Entered and Authorized by:   Lehman Prom FNP   Signed by:   Lehman Prom FNP on 06/19/2010   Method used:   Faxed to ...       [facility] - Pharmac (retail)       [address] [place], [place]  [address]       Ph: [phone]       Fax: [phone]   RxID:   [id number]   Diabetic Foot Exam Foot Inspection Is there a history of a foot ulcer?  No Is there a foot ulcer now?              No Can the patient see the bottom of their feet?          Yes Are the shoes appropriate in style and fit?          Yes Is there swelling or an abnormal foot shape?          No Are the toenails long?                No Are the toenails thick?                No Are the toenails ingrown?              No Is there heavy callous build-up?              No Is there pain in the calf muscle (Intermittent claudication) when walking?    NoIs there a claw toe deformity?              No Is there elevated skin temperature?            No Is there limited ankle dorsiflexion?            No Is there foot or ankle muscle weakness?            No  Diabetic Foot Care  Education Patient educated on appropriate care of diabetic feet.  Pulse Check          Right Foot          Left Foot Dorsalis Pedis:        normal            normal Comments: tinea pedis has resoloved   Orders Added: 1)  Est. Patient Level III [30865] 2)  UA Dipstick w/o Micro (manual) [81002] 3)  Capillary Blood Glucose/CBG [82948] 4)  Hemoglobin A1C [83036]    Laboratory Results   Urine Tests  Date/Time Received: June 19, 2010 12:05 PM   Routine Urinalysis   Color: lt. yellow Glucose: negative   (Normal Range: Negative) Bilirubin: negative   (Normal Range: Negative) Ketone: negative   (Normal Range: Negative) Spec. Gravity: 1.010   (Normal Range: 1.003-1.035) Blood: negative   (Normal Range: Negative) pH: 6.0   (Normal Range: 5.0-8.0) Protein: negative   (Normal Range: Negative) Urobilinogen: 0.2   (Normal Range: 0-1) Nitrite: negative   (Normal Range: Negative) Leukocyte Esterace: trace   (Normal Range: Negative)     Blood Tests   Date/Time Received: June 19, 2010 11:59 AM   1 Hour Prenatal Glucose:: 139mg /dL HQIO9G: 2.9%   (Normal Range: Non-Diabetic - 3-6%   Control Diabetic - 6-8%)

## 2010-11-05 ENCOUNTER — Emergency Department (HOSPITAL_COMMUNITY)
Admission: EM | Admit: 2010-11-05 | Discharge: 2010-11-05 | Disposition: A | Discharge status: Home / Self Care | Payer: Self-pay | Attending: Emergency Medicine | Admitting: Emergency Medicine

## 2010-11-05 DIAGNOSIS — I10 Essential (primary) hypertension: Secondary | ICD-10-CM | POA: Insufficient documentation

## 2010-11-05 DIAGNOSIS — E785 Hyperlipidemia, unspecified: Secondary | ICD-10-CM | POA: Insufficient documentation

## 2010-11-05 DIAGNOSIS — Y92009 Unspecified place in unspecified non-institutional (private) residence as the place of occurrence of the external cause: Secondary | ICD-10-CM | POA: Insufficient documentation

## 2010-11-05 DIAGNOSIS — Z79899 Other long term (current) drug therapy: Secondary | ICD-10-CM | POA: Insufficient documentation

## 2010-11-05 DIAGNOSIS — W268XXA Contact with other sharp object(s), not elsewhere classified, initial encounter: Secondary | ICD-10-CM | POA: Insufficient documentation

## 2010-11-05 DIAGNOSIS — E119 Type 2 diabetes mellitus without complications: Secondary | ICD-10-CM | POA: Insufficient documentation

## 2010-11-05 DIAGNOSIS — M79609 Pain in unspecified limb: Secondary | ICD-10-CM | POA: Insufficient documentation

## 2010-11-05 DIAGNOSIS — S61209A Unspecified open wound of unspecified finger without damage to nail, initial encounter: Secondary | ICD-10-CM | POA: Insufficient documentation

## 2010-12-14 ENCOUNTER — Other Ambulatory Visit (HOSPITAL_COMMUNITY): Payer: Self-pay | Admitting: Family Medicine

## 2010-12-14 DIAGNOSIS — Z1231 Encounter for screening mammogram for malignant neoplasm of breast: Secondary | ICD-10-CM

## 2011-01-14 ENCOUNTER — Ambulatory Visit (HOSPITAL_COMMUNITY)
Admission: RE | Admit: 2011-01-14 | Discharge: 2011-01-14 | Disposition: A | Discharge status: Home / Self Care | Payer: Self-pay | Source: Ambulatory Visit | Attending: Family Medicine | Admitting: Family Medicine

## 2011-01-14 DIAGNOSIS — Z1231 Encounter for screening mammogram for malignant neoplasm of breast: Secondary | ICD-10-CM | POA: Insufficient documentation

## 2012-02-24 ENCOUNTER — Ambulatory Visit (INDEPENDENT_AMBULATORY_CARE_PROVIDER_SITE_OTHER): Payer: Self-pay | Admitting: Family Medicine

## 2012-02-24 ENCOUNTER — Encounter: Payer: Self-pay | Admitting: Family Medicine

## 2012-02-24 VITALS — BP 148/79 | HR 76 | Temp 97.8°F | Ht 61.22 in | Wt 176.0 lb

## 2012-02-24 DIAGNOSIS — E119 Type 2 diabetes mellitus without complications: Secondary | ICD-10-CM

## 2012-02-24 DIAGNOSIS — R82998 Other abnormal findings in urine: Secondary | ICD-10-CM

## 2012-02-24 DIAGNOSIS — R8290 Unspecified abnormal findings in urine: Secondary | ICD-10-CM

## 2012-02-24 LAB — POCT UA - MICROSCOPIC ONLY

## 2012-02-24 LAB — POCT URINALYSIS DIPSTICK
Bilirubin, UA: NEGATIVE
Glucose, UA: NEGATIVE
Spec Grav, UA: 1.01

## 2012-02-24 MED ORDER — ASPIRIN 81 MG PO TABS: 81.0000 mg | ORAL_TABLET | Freq: Every day | ORAL | Status: DC

## 2012-02-24 MED ORDER — CLONIDINE HCL 0.3 MG PO TABS: 0.3000 mg | ORAL_TABLET | Freq: Two times a day (BID) | ORAL | Status: DC

## 2012-02-24 MED ORDER — METFORMIN HCL 1000 MG PO TABS: 1000.0000 mg | ORAL_TABLET | Freq: Two times a day (BID) | ORAL | Status: DC

## 2012-02-24 NOTE — Progress Notes (Signed)
  Subjective:    Patient ID: Carol Finley, female    DOB: February 06, 1951, 61 y.o.   MRN: [id number]  HPI  61 y.o. F who presents for an initial visit to establish care. She has diabetes mellitus -  type 2, HTN, and right lower extremity pain.   Diabetes  Taking and tolerating: yes - Metformin and Glipizide Fasting blood sugars:not checking  Hypoglycemic symptoms: no Diet Changes: none Exercise: none Visual problems: no Last eye exam:unknown Monitoring feet: yes - denies ulcers Numbness/Tingling: no Diabetic Labs:  Lab Results  Component Value Date   HGBA1C 8.9 02/24/2012   HGBA1C 6.7 06/19/2010   HGBA1C 7.2 03/23/2010   Lab Results  Component Value Date   MICROALBUR 30.28* 12/30/2009   LDLCALC 102* 03/31/2010   CREATININE 0.60 01/20/2010   Last microalbumin: Lab Results  Component Value Date   MICROALBUR 30.28* 12/30/2009   Taking an ACE-I ?: yes - lisinopril    Goals for health - Control DM  Meds - All medications reviewed and entered in appropriate sections of chart   Review of Systems Positive for right lower extremity pain Negative for chest pain, SOB    Objective:   Physical Exam BP 148/79  Pulse 76  Temp 97.8 F (36.6 C) (Oral)  Ht 5' 1.22" (1.555 m)  Wt 176 lb (79.833 kg)  BMI 33.02 kg/m2 Gen: middle aged Hispanic female, speaks only Spanish, pleasant, obese body habitus HEENT: NCAT, PERRLA, no JVD CV: RRR, no murmurs, carotid bruits Pulm: CTA-B Extremities: no edema Feet: no ulcers, negative monofilament testing       Assessment & Plan:  61 year old patient with HTN and DM who presents as new patient.

## 2012-02-24 NOTE — Progress Notes (Signed)
Interpreter Wyvonnia Dusky for Dr Clinton Sawyer

## 2012-02-24 NOTE — Patient Instructions (Signed)
Por Favor, regrese in 2 semanas para hablar [employer].   Dr. Clinton Sawyer

## 2012-02-29 ENCOUNTER — Encounter: Payer: Self-pay | Admitting: Family Medicine

## 2012-02-29 NOTE — Assessment & Plan Note (Signed)
Check A1C and urine for micro-albumin. F/u in 1 month to address findings.

## 2012-03-07 ENCOUNTER — Telehealth: Payer: Self-pay | Admitting: Family Medicine

## 2012-03-07 NOTE — Telephone Encounter (Signed)
Mollye needs and appt for OC please let me know if you need help :)   MJ

## 2012-04-17 ENCOUNTER — Other Ambulatory Visit: Payer: Self-pay | Admitting: *Deleted

## 2012-04-17 MED ORDER — LISINOPRIL 40 MG PO TABS: 40.0000 mg | ORAL_TABLET | Freq: Every day | ORAL | Status: DC

## 2012-04-17 MED ORDER — HYDROCHLOROTHIAZIDE 25 MG PO TABS: 25.0000 mg | ORAL_TABLET | Freq: Every day | ORAL | Status: DC

## 2012-04-17 NOTE — Telephone Encounter (Signed)
Patient needs visit before next refill

## 2012-05-05 ENCOUNTER — Other Ambulatory Visit (HOSPITAL_COMMUNITY)
Admission: RE | Admit: 2012-05-05 | Discharge: 2012-05-05 | Disposition: A | Discharge status: Home / Self Care | Payer: No Typology Code available for payment source | Source: Ambulatory Visit | Attending: Family Medicine | Admitting: Family Medicine

## 2012-05-05 ENCOUNTER — Ambulatory Visit (INDEPENDENT_AMBULATORY_CARE_PROVIDER_SITE_OTHER): Payer: No Typology Code available for payment source | Admitting: Family Medicine

## 2012-05-05 VITALS — BP 171/88 | HR 82 | Ht 61.22 in | Wt 169.0 lb

## 2012-05-05 DIAGNOSIS — E785 Hyperlipidemia, unspecified: Secondary | ICD-10-CM

## 2012-05-05 DIAGNOSIS — Z124 Encounter for screening for malignant neoplasm of cervix: Secondary | ICD-10-CM

## 2012-05-05 DIAGNOSIS — Z1151 Encounter for screening for human papillomavirus (HPV): Secondary | ICD-10-CM | POA: Insufficient documentation

## 2012-05-05 DIAGNOSIS — R300 Dysuria: Secondary | ICD-10-CM

## 2012-05-05 DIAGNOSIS — Z01419 Encounter for gynecological examination (general) (routine) without abnormal findings: Secondary | ICD-10-CM | POA: Insufficient documentation

## 2012-05-05 DIAGNOSIS — Z0000 Encounter for general adult medical examination without abnormal findings: Secondary | ICD-10-CM

## 2012-05-05 LAB — POCT UA - MICROSCOPIC ONLY: WBC, Ur, HPF, POC: >20

## 2012-05-05 LAB — POCT URINALYSIS DIPSTICK
Ketones, UA: NEGATIVE
Nitrite, UA: NEGATIVE
Protein, UA: 100
pH, UA: 6

## 2012-05-05 NOTE — Patient Instructions (Addendum)
Fue bueno verte hoy. Por favor, lea a continuacin.   1. Papaniclau - Yo le enviar los [place].   2. Colesterol - te voy a enviar una copia de los [place].   3. Mamografa - Por favor llame [facility] para hacer una cita.   4. Dolor al Carol Finley - Vamos a tener una cultura de la orina y comenzar con antibiticos.   Por favor regrese en 2 semanas para hablar de su diabetes y Print production planner.   Atentamente,  Dr. Clinton Sawyer

## 2012-05-05 NOTE — Progress Notes (Signed)
  Subjective:    Patient ID: Carol Finley, female    DOB: Mar 11, 1951, 62 y.o.   MRN: [id number]  HPI  62 year old F with DM type 2 and HTN who presents for an annual physical exam. Interpreter present.   GYN - G3P3, LMP 14 years ago, no interval bleeding, no history of GYN surgery; no hx of abnormal pap; not sexually active   Breast - Last mammogram 2 years ago; normal; no new lump, bumps, or unilateral drainage; no family history of breast cancer  Dysuria - Started 2 days ago; no urgency or polyuria; no back pain nausea or vomiting; no history of UTI or pyelonephritis   Review of Systems     Objective:   Physical Exam  BP 171/88  Pulse 82  Ht 5' 1.22" (1.555 m)  Wt 169 lb (76.658 kg)  BMI 31.70 kg/m2 Gen: middle aged, hispanic female, Spanish speaking  Back: no CVA tenderness Breast: symmetric size, no nodule of breast or axillae, no nipple drainage GU: External: no lesions Vagina: no blood in vault Cervix: no lesion; no mucopurulent d/c Uterus: small, mobile Adnexa: no masses; non tender      Assessment & Plan:

## 2012-05-06 LAB — LIPID PANEL: Total CHOL/HDL Ratio: 3.6 Ratio

## 2012-05-07 DIAGNOSIS — R300 Dysuria: Secondary | ICD-10-CM | POA: Insufficient documentation

## 2012-05-07 DIAGNOSIS — Z0000 Encounter for general adult medical examination without abnormal findings: Secondary | ICD-10-CM | POA: Insufficient documentation

## 2012-05-07 NOTE — Assessment & Plan Note (Signed)
Follow up GYN cytology. Encouraged to all [facility] for mammogram.

## 2012-05-07 NOTE — Assessment & Plan Note (Signed)
Uncertain etiology. Check urinalysis.

## 2012-05-10 ENCOUNTER — Telehealth: Payer: Self-pay | Admitting: Family Medicine

## 2012-05-10 DIAGNOSIS — E119 Type 2 diabetes mellitus without complications: Secondary | ICD-10-CM

## 2012-05-10 DIAGNOSIS — R300 Dysuria: Secondary | ICD-10-CM

## 2012-05-10 MED ORDER — FUROSEMIDE 20 MG PO TABS: 20.0000 mg | ORAL_TABLET | Freq: Every day | ORAL | Status: DC

## 2012-05-10 MED ORDER — ASPIRIN 81 MG PO TABS: 81.0000 mg | ORAL_TABLET | Freq: Every day | ORAL | Status: DC

## 2012-05-10 MED ORDER — CEPHALEXIN 500 MG PO CAPS: 500.0000 mg | ORAL_CAPSULE | Freq: Two times a day (BID) | ORAL | Status: DC

## 2012-05-10 MED ORDER — LISINOPRIL 40 MG PO TABS: 40.0000 mg | ORAL_TABLET | Freq: Every day | ORAL | Status: DC

## 2012-05-10 MED ORDER — HYDROCHLOROTHIAZIDE 25 MG PO TABS: 25.0000 mg | ORAL_TABLET | Freq: Every day | ORAL | Status: DC

## 2012-05-10 MED ORDER — GABAPENTIN 300 MG PO CAPS: 300.0000 mg | ORAL_CAPSULE | Freq: Two times a day (BID) | ORAL | Status: DC

## 2012-05-10 MED ORDER — GLIPIZIDE ER 10 MG PO TB24: 10.0000 mg | ORAL_TABLET | Freq: Every day | ORAL | Status: DC

## 2012-05-10 MED ORDER — CLONIDINE HCL 0.3 MG PO TABS: 0.3000 mg | ORAL_TABLET | Freq: Two times a day (BID) | ORAL | Status: DC

## 2012-05-10 MED ORDER — METFORMIN HCL 1000 MG PO TABS: 1000.0000 mg | ORAL_TABLET | Freq: Two times a day (BID) | ORAL | Status: DC

## 2012-05-10 NOTE — Telephone Encounter (Signed)
Called patient to inform her that Pap smear was normal and that cholesterol needs treatment, which we will discuss at her upcoming visit. She is also still having urinary symptoms, and we did not perform a urine culture. However, her UA is indicative of a UTI and also has hematuria. Therefore, I will [facility] a script [facility] the [facility], which she prefers. Also, she would like all prescriptions sent there. I will do this.

## 2012-05-29 ENCOUNTER — Other Ambulatory Visit: Payer: Self-pay | Admitting: Family Medicine

## 2012-06-08 ENCOUNTER — Ambulatory Visit (HOSPITAL_COMMUNITY)
Admission: RE | Admit: 2012-06-08 | Discharge: 2012-06-08 | Disposition: A | Discharge status: Home / Self Care | Payer: No Typology Code available for payment source | Source: Ambulatory Visit | Attending: Family Medicine | Admitting: Family Medicine

## 2012-06-08 DIAGNOSIS — Z1231 Encounter for screening mammogram for malignant neoplasm of breast: Secondary | ICD-10-CM | POA: Insufficient documentation

## 2012-07-24 ENCOUNTER — Ambulatory Visit (INDEPENDENT_AMBULATORY_CARE_PROVIDER_SITE_OTHER): Payer: No Typology Code available for payment source | Admitting: Family Medicine

## 2012-07-24 VITALS — BP 154/74 | HR 80 | Ht 61.5 in | Wt 170.9 lb

## 2012-07-24 DIAGNOSIS — I10 Essential (primary) hypertension: Secondary | ICD-10-CM

## 2012-07-24 DIAGNOSIS — E119 Type 2 diabetes mellitus without complications: Secondary | ICD-10-CM

## 2012-07-24 LAB — POCT GLYCOSYLATED HEMOGLOBIN (HGB A1C): Hemoglobin A1C: 10

## 2012-07-24 MED ORDER — INSULIN GLARGINE 100 UNIT/ML ~~LOC~~ SOLN: 10.0000 [IU] | Freq: Every day | SUBCUTANEOUS | Status: DC

## 2012-07-24 NOTE — Progress Notes (Signed)
  Subjective:    Patient ID: Carol Finley, female    DOB: 09-07-1950, 62 y.o.   MRN: [id number]  HPI   1. 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 Hypertension  Home BP monitoring:  BP Readings from Last 3 Encounters:  07/24/12 154/74  05/05/12 171/88  02/24/12 148/79    Prescribed meds: Clonidine 0.3 mg BID, HCTZ 25 mg daily, lisinopril 40   Hypertension ROS: taking medications as instructed, no medication side effects noted, no TIA's, no chest pain on exertion, no dyspnea on exertion and no swelling of ankles   Review of Systems See HPI    Objective:   Physical Exam BP 154/74  Pulse 80  Ht 5' 1.5" (1.562 m)  Wt 170 lb 14.4 oz (77.52 kg)  BMI 31.77 kg/m2 Gen: well appearing elderly Hispanic female, non distressed, pleasant and conversant  CV: RRR. No murmurs, no carotid bruits Lungs: CTA-B Extremities: no edema      Assessment & Plan:

## 2012-07-24 NOTE — Patient Instructions (Signed)
Por favor, empezar a Archivist de [facility]. Se le inyectar 10 unidades en su piel cada maana. Puede aumentar que para el 1 unidad cada da hasta que su azcar en la sangre es menos de 120. Cuando el nivel de azcar en la sangre est por debajo de 120, por favor, qudate con la misma dosis.   Por favor, use la insulina que te doy hoy. Te dar una receta para agujas. Por favor, el seguimiento en 1-2 semanas.   Dr. Clinton Sawyer

## 2012-07-26 NOTE — Assessment & Plan Note (Signed)
A1C > 10, switching to Insulin. Started with Lantus pens 10 units a day. Patient demonstrated how to load pen and inject self under my supervision. Will increase by 1 unit per day as long as fasting CBG > 120. Will return in 1-2 weeks for follow up. Explained dangers of hypoglycemia.

## 2012-07-26 NOTE — Assessment & Plan Note (Signed)
Cont current therapy. Increase HCTZ as needed in future.

## 2012-08-08 ENCOUNTER — Encounter: Payer: Self-pay | Admitting: Family Medicine

## 2012-08-08 ENCOUNTER — Ambulatory Visit (INDEPENDENT_AMBULATORY_CARE_PROVIDER_SITE_OTHER): Payer: No Typology Code available for payment source | Admitting: Family Medicine

## 2012-08-08 VITALS — BP 165/75 | HR 94 | Ht 61.5 in | Wt 168.9 lb

## 2012-08-08 DIAGNOSIS — E119 Type 2 diabetes mellitus without complications: Secondary | ICD-10-CM

## 2012-08-08 DIAGNOSIS — I10 Essential (primary) hypertension: Secondary | ICD-10-CM

## 2012-08-08 DIAGNOSIS — E785 Hyperlipidemia, unspecified: Secondary | ICD-10-CM | POA: Insufficient documentation

## 2012-08-08 MED ORDER — SIMVASTATIN 40 MG PO TABS: 40.0000 mg | ORAL_TABLET | Freq: Every day | ORAL | Status: DC

## 2012-08-08 MED ORDER — LISINOPRIL 40 MG PO TABS: 40.0000 mg | ORAL_TABLET | Freq: Every day | ORAL | Status: DC

## 2012-08-08 MED ORDER — GLUCOSE BLOOD VI STRP: ORAL_STRIP | Status: DC

## 2012-08-08 MED ORDER — CLONIDINE HCL 0.3 MG PO TABS: 0.3000 mg | ORAL_TABLET | Freq: Two times a day (BID) | ORAL | Status: DC

## 2012-08-08 MED ORDER — NIFEDIPINE ER OSMOTIC RELEASE 60 MG PO TB24: 60.0000 mg | ORAL_TABLET | Freq: Every day | ORAL | Status: DC

## 2012-08-08 MED ORDER — FUROSEMIDE 20 MG PO TABS: 20.0000 mg | ORAL_TABLET | Freq: Every day | ORAL | Status: DC

## 2012-08-08 MED ORDER — HYDROCHLOROTHIAZIDE 25 MG PO TABS: 50.0000 mg | ORAL_TABLET | Freq: Every day | ORAL | Status: DC

## 2012-08-08 NOTE — Assessment & Plan Note (Signed)
Very poorly controlled HTN despite 4 agents. No obvious secondary causes. Add nifedipine ER 60 mg to regimen and increase to 50 mg daily. Continue lisinopril 40 mg daily, clonidine 0.3 mg TID, and furosemide 20 mg. Follow up in 2 weeks. Consider spironolactone or beta blocker in future.

## 2012-08-08 NOTE — Assessment & Plan Note (Signed)
Patient not on statin despite DM type 2. CV Risk > 15%, so need high intensity statin. Rx for simvastatin 40 [facility], but I did not get a chance to counsel patient on importance of this medication. Readdress at upcoming visit.

## 2012-08-08 NOTE — Progress Notes (Signed)
  Subjective:    Patient ID: Carol Finley, female    DOB: April 24, 1950, 62 y.o.   MRN: [id number]  HPI  62 year old F with DM and HTN.   DM - Last A1C was 10.0 on 07/24/12; Patient was started Lantus 10U daily and instructed to titrate up; She was also instructed to decreased number of tortillas to 2 from 6 daily; Patient taking Lantus 11 units daily and cannot repeat back instructions that I gave at previous visit to increase 1 units daily for a goal fasting CBG of < 120 mg/dl; She brings in a log of AM CBG and range in 160-230; She is still taking Metformin 1000 mg BID and Glipizde 5 mg daily; No recent changes to diet;   HTN - Prescribed lisinopril 40, HCTZ 25, and Clonidine 03. Mg BID, furosemide 20 mg  - Claims to be taking all meds  Cholesterol - 10 CV risk = 15.3% and not on a statin,   Review of Systems Negative unless stated in HPI     Objective:   Physical Exam  BP 165/75  Pulse 94  Ht 5' 1.5" (1.562 m)  Wt 168 lb 14.4 oz (76.613 kg)  BMI 31.4 kg/m2 BP recheck - 170/80 in right, 160/80 in left  CV: RRR, no murmurs, no carotid bruits Lungs: CTA-B Abd: soft, NDNT, no abdominal briuts  Lipid Panel     Component Value Date/Time   CHOL 172 05/05/2012 0953   TRIG 155* 05/05/2012 0953   HDL 48 05/05/2012 0953   CHOLHDL 3.6 05/05/2012 0953   VLDL 31 05/05/2012 0953   LDLCALC 93 05/05/2012 0953       Assessment & Plan:  Poorly controlled type 2 diabetes mellitus and hypertension.

## 2012-08-08 NOTE — Patient Instructions (Signed)
Diabetes -. Es importante aumentar su inuslin en una unidad cada da hasta que su azcar en la sangre es inferior a 120 Si su azcar en sangre es demasiado bajo, entonces beber una [employer].   Alta presin arterial - Estamos agregando un medicamento ms llamado nifedipina. Por favor, sigan todos sus otros medicamentos.   Por favor regrese a la clnica en 2 semanas.

## 2012-08-08 NOTE — Assessment & Plan Note (Signed)
Patient re-instructed how to titrate Lantus. She did very poorly with teach-back method. Thankfully, her grandson with whom she lives was present and understood the instructions. He noted that he will help her with this technique. She will also continue Metformin and stop glipizide. Given instructions for management of hypoglycemia. F/u in 2 weeks.   Patient introduced to Arlys John for further assistance in meeting goal.

## 2012-08-17 ENCOUNTER — Telehealth: Payer: Self-pay | Admitting: *Deleted

## 2012-08-17 NOTE — Telephone Encounter (Signed)
Pt called to find out, if her insulin was changed/ordered? Please let Marines know.

## 2012-08-22 ENCOUNTER — Telehealth: Payer: Self-pay | Admitting: Family Medicine

## 2012-08-22 DIAGNOSIS — E1165 Type 2 diabetes mellitus with hyperglycemia: Secondary | ICD-10-CM

## 2012-08-22 MED ORDER — INSULIN NPH (HUMAN) (ISOPHANE) 100 UNIT/ML ~~LOC~~ SUSP: 8.0000 [IU] | Freq: Two times a day (BID) | SUBCUTANEOUS | Status: DC

## 2012-08-22 NOTE — Telephone Encounter (Signed)
If patient needs a refill on any medication, please advise to call pharmacy and have pharmacy call us. Carol Finley

## 2012-08-22 NOTE — Telephone Encounter (Signed)
Spoke with Marines Jean Rosenthal regarding Spanish speaking  patient.  Patient needs a [facility] for [facility] to [facility] her insulin.  Her current insulin script is $200 but they offer an insulin for $6.  Marines states information hand given to Dr. Clinton Sawyer regarding the above.   Will defer to Dr. Clinton Sawyer for further instruction.  Lakendrick Paradis L

## 2012-08-22 NOTE — Telephone Encounter (Signed)
I will need to know what medication

## 2012-08-22 NOTE — Telephone Encounter (Signed)
I spoke to the patient and she ran out of insulin yesterday. She was taking 12 units of Lantus daily and blood sugars were in the 180s. Based on her weight, using an initial starting dose of 0.2 units/kg/day divided BID, I will dose insulin NPH BID with meals. She is agreeable to this plan and will return for follow up on 09/01/12.

## 2012-08-22 NOTE — Telephone Encounter (Signed)
Pt called to know about her medication. Pt needs to know status of it.    Marines

## 2012-08-29 ENCOUNTER — Telehealth: Payer: Self-pay | Admitting: *Deleted

## 2012-08-29 NOTE — Telephone Encounter (Signed)
Called pharmacy and left message instructing that insulin glargine (Lantus) should be canceled and patient should only have prescription for insulin NPH (Humulin).

## 2012-08-29 NOTE — Telephone Encounter (Signed)
Pt does not qualify for lantus but can get Humalin 70/30 or Humalin N or Humalin R with the orange card with the [facility]. Please fax to the [facility]. [phone]) which you would like her to have. Wyatt Haste, RN-BSN

## 2012-08-30 ENCOUNTER — Telehealth: Payer: Self-pay | Admitting: *Deleted

## 2012-08-30 NOTE — Telephone Encounter (Signed)
[facility] pharmacy calling requesting a new prescription be called in for Humulin insulin.  Humulin N 100 units/ml, inject 8 units into skin two times daily before a meal,   Dispense 1 vial with 12 refills called to [phone].  Carol Finley

## 2012-09-01 ENCOUNTER — Encounter: Payer: Self-pay | Admitting: Family Medicine

## 2012-09-01 ENCOUNTER — Ambulatory Visit (INDEPENDENT_AMBULATORY_CARE_PROVIDER_SITE_OTHER): Payer: No Typology Code available for payment source | Admitting: Family Medicine

## 2012-09-01 VITALS — BP 99/63 | HR 65 | Wt 164.0 lb

## 2012-09-01 DIAGNOSIS — E119 Type 2 diabetes mellitus without complications: Secondary | ICD-10-CM

## 2012-09-01 DIAGNOSIS — R5383 Other fatigue: Secondary | ICD-10-CM

## 2012-09-01 DIAGNOSIS — R5381 Other malaise: Secondary | ICD-10-CM

## 2012-09-01 LAB — CBC
HCT: 39.2 % (ref 36.0–46.0)
MCHC: 32.9 g/dL (ref 30.0–36.0)
MCV: 92.5 fL (ref 78.0–100.0)
Platelets: 260 10*3/uL (ref 150–400)
RDW: 14.4 % (ref 11.5–15.5)
WBC: 7.1 10*3/uL (ref 4.0–10.5)

## 2012-09-01 LAB — BASIC METABOLIC PANEL
BUN: 24 mg/dL — ABNORMAL HIGH (ref 6–23)
Chloride: 97 mEq/L (ref 96–112)
Creat: 0.8 mg/dL (ref 0.50–1.10)
Potassium: 4.3 mEq/L (ref 3.5–5.3)

## 2012-09-01 NOTE — Progress Notes (Signed)
  Subjective:    Patient ID: Carol Finley, female    DOB: Jan 03, 1951, 62 y.o.   MRN: [id number]  HPI  Diabetes F/u - Patient switched to insulin NPH BID from Lantus due to cost; Currently using 8 units of NPH insulin BID; her sugars have ranged from 162-213; denies hypoglycemia   Fatigue- Patient notes that since her last visit she has developed increasing fatigue. It occurs most days. She feels like she needs to nap daily, which is rare for her. She is not working more. She has not started any new medications aside from insulin. She does not have a history of anemia or thyroid problems. She denies depression. She has been losing weight unintentionally.    Review of Systems See HPI      Objective:   Physical Exam BP 99/63  Pulse 65  Wt 164 lb (74.39 kg)  BMI 30.49 kg/m2  Wt Readings from Last 5 Encounters:  09/01/12 164 lb (74.39 kg)  08/08/12 168 lb 14.4 oz (76.613 kg)  07/24/12 170 lb 14.4 oz (77.52 kg)  05/05/12 169 lb (76.658 kg)  02/24/12 176 lb (79.833 kg)   Gen: elderly Hispanic female, non ill appearing, pleasant and conversant HEENT: mild conjunctival pallor, no lymphadenopathy, no thyromegaly, no thyroid nodules  CV: RRR, no murmurs, no carotid bruits cap refill < 2 sec   Pulm: normal work of breathing, clear to auscultation bilaterally Psych: normal affect, thought content, and speech pattern     Assessment & Plan:

## 2012-09-01 NOTE — Patient Instructions (Addendum)
Please return in 4 weeks for another check.

## 2012-09-02 LAB — TSH: TSH: 2.644 u[IU]/mL (ref 0.350–4.500)

## 2012-09-05 DIAGNOSIS — R5381 Other malaise: Secondary | ICD-10-CM | POA: Insufficient documentation

## 2012-09-05 NOTE — Assessment & Plan Note (Signed)
No red flags on history except unintentional weight loss. DDX includes anemia, hypothyroidism, vitamin deficiency, infectious component, and oncologic process. Will check CBC, BMET, and TSH. F/u in 4 weeks.

## 2012-09-05 NOTE — Assessment & Plan Note (Signed)
Moderate control on NPH 8 units BID. Lowest CBG is 163, so increase to 10-12 units BID depending on sugar. Patient knows symptoms of hypoglycemia and appropriate treatment.

## 2012-10-23 ENCOUNTER — Ambulatory Visit: Payer: Self-pay | Admitting: Family Medicine

## 2012-11-06 ENCOUNTER — Telehealth: Payer: Self-pay | Admitting: Family Medicine

## 2012-11-06 DIAGNOSIS — I10 Essential (primary) hypertension: Secondary | ICD-10-CM

## 2012-11-06 MED ORDER — HYDROCHLOROTHIAZIDE 25 MG PO TABS: 50.0000 mg | ORAL_TABLET | Freq: Every day | ORAL | Status: DC

## 2012-11-06 NOTE — Telephone Encounter (Signed)
Will fwd to MD for approval.  I will call Rx in if approved.  Japhet Morgenthaler, Darlyne Russian, CMA

## 2012-11-06 NOTE — Telephone Encounter (Signed)
Pt called to request HIDRODIURIL 25 mg Pt is running out of med. Pt has an appt on 08/11  Please send refill to [facility].  Thank You   Marines

## 2012-11-06 NOTE — Telephone Encounter (Signed)
Rx printed and faxed to [facility].

## 2012-11-13 ENCOUNTER — Ambulatory Visit (INDEPENDENT_AMBULATORY_CARE_PROVIDER_SITE_OTHER): Payer: No Typology Code available for payment source | Admitting: Family Medicine

## 2012-11-13 ENCOUNTER — Encounter: Payer: Self-pay | Admitting: Family Medicine

## 2012-11-13 VITALS — BP 115/70 | HR 69 | Ht 61.5 in | Wt 157.6 lb

## 2012-11-13 DIAGNOSIS — H547 Unspecified visual loss: Secondary | ICD-10-CM

## 2012-11-13 DIAGNOSIS — I10 Essential (primary) hypertension: Secondary | ICD-10-CM

## 2012-11-13 DIAGNOSIS — E119 Type 2 diabetes mellitus without complications: Secondary | ICD-10-CM

## 2012-11-13 LAB — POCT GLYCOSYLATED HEMOGLOBIN (HGB A1C): Hemoglobin A1C: 9.5

## 2012-11-13 NOTE — Patient Instructions (Addendum)
Su A1C es 9.5. Eso es demasiado alta. Necesitamos que sea inferior a 7,0. Por lo tanto, aumentar su dosis de insulina para 15 [employer] al [employer]. Adems, me gustara que para cumplir con nuestro entrenador de Joshua.   Para los ojos, voy a hacer una remisin a Counselling psychologist.   Regreso en 1 mes.  Sincerely,   Dr. Clinton Sawyer

## 2012-11-13 NOTE — Progress Notes (Signed)
Patient ID: Asia Dusenbury, female   DOB: 12/30/1950, 62 y.o.   MRN: [id number]   Subjective:   Diabetes  Recent Issues:   Medication Compliance:  Diabetes medication: Yes - Prescribed Insulin   Taking ACE-I: Yes - lisinopril 40 mg daily  Taking statin: Yes - simvastatin 40 mg  Behavioral:  Home CBG Monitoring: Yes - 167-198  Diet changes: No  Exercise: No  Health Maintenance:  Visual problems: Yes - "foggyness" in her eyes, almost every day  Last eye exam:07/24/12  Last dental visit:   Foot ulcers: No   Hypertension  Home BP monitoring:   Office BP: BP Readings from Last 3 Encounters:  11/13/12 160/80  09/01/12 99/63  08/08/12 165/75    Prescribed meds: Patient does not which medications are for what conditions, see belwo  Hypertension ROS:  Taking medications as prescribed:Yes   Chest pain: No Shortness of breath: No Swelling of extremities: No TIA symptoms: No   Review of Systems:  Pertinent items are noted in HPI.  Current Outpatient Prescriptions on File Prior to Visit  Medication Sig Dispense Refill  . aspirin 81 MG tablet Take 1 tablet (81 mg total) by mouth daily.  30 tablet  5  . cloNIDine (CATAPRES) 0.3 MG tablet Take 1 tablet (0.3 mg total) by mouth 2 (two) times daily.  60 tablet  5  . furosemide (LASIX) 20 MG tablet Take 1 tablet (20 mg total) by mouth daily.  30 tablet  5  . gabapentin (NEURONTIN) 300 MG capsule Take 1 capsule (300 mg total) by mouth 2 (two) times daily.  60 capsule  5  . glucose blood test strip Use as instructed  100 each  12  . hydrochlorothiazide (HYDRODIURIL) 25 MG tablet Take 2 tablets (50 mg total) by mouth daily.  30 tablet  11  . insulin NPH (HUMULIN N) 100 UNIT/ML injection Inject 8 Units into the skin 2 (two) times daily before a meal.  1 vial  12  . lisinopril (PRINIVIL,ZESTRIL) 40 MG tablet Take 1 tablet (40 mg total) by mouth daily.  30 tablet  5  . metFORMIN (GLUCOPHAGE) 1000 MG tablet Take 1 tablet  (1,000 mg total) by mouth 2 (two) times daily with a meal.  60 tablet  5  . NIFEdipine (PROCARDIA XL/ADALAT-CC) 60 MG 24 hr tablet Take 1 tablet (60 mg total) by mouth daily.  30 tablet  5  . simvastatin (ZOCOR) 40 MG tablet Take 1 tablet (40 mg total) by mouth at bedtime.  90 tablet  3   No current facility-administered medications on file prior to visit.  '    Objective:   Physical Exam: BP 160/80  Pulse 69  Ht 5' 1.5" (1.562 m)  Wt 157 lb 9.6 oz (71.487 kg)  BMI 29.3 kg/m2  General: alert, well appearing, and in no distress Eyes: right eye with clouding and decreased pupillary light response; left eye - PERRLA and no retinopathy Cardiovascular: RRR, nl S1 and S2, no murmur Feet: warm, good capillary refill   Labs:   Diabetic Labs:  Lab Results  Component Value Date   HGBA1C 10.0 07/24/2012   HGBA1C 8.9 02/24/2012   HGBA1C 6.7 06/19/2010   Lab Results  Component Value Date   MICROALBUR 30.28* 12/30/2009   LDLCALC 93 05/05/2012   CREATININE 0.80 09/01/2012   Last microalbumin: Lab Results  Component Value Date   MICROALBUR 30.28* 12/30/2009        Assessment & Plan:

## 2012-11-13 NOTE — Progress Notes (Signed)
Patient Identified Concern:  DM managment Stage of Change Patient Is In:  Preparation, has been making changes for less than 6 months. Patient Reported Barriers:  Pt did not identify any barriers at this time. Patient Reported Perceived Benefits:  Feeling better Patient Reports Self-Efficacy:   Pt displays some self efficacy with recent behavior changes. Behavior Change Supports:  To be determined Goals:  To increase daily vegetables, continue walking 20 minutes daily to bus, continue taking medications daily, follow up in 1 month with PCP. Patient Education:  We talked about behaviors that may influence DM such as medication adherence, exercise, diet (less sugar/carbohydrates).   Pt reports taking all medications daily and was able to identity what the medications were for.  Pt reports having no problems with getting medications.  Pt reports walking 20 minutes daily to catch bus for work.  We talked about increasing speed for cardiovascular health.  Pt reports eating very little bread/tortillas. Reports having started eating more vegetables and will increase them more over the next month.  Arlys John, health coach

## 2012-11-14 ENCOUNTER — Telehealth: Payer: Self-pay | Admitting: Family Medicine

## 2012-11-14 DIAGNOSIS — E1140 Type 2 diabetes mellitus with diabetic neuropathy, unspecified: Secondary | ICD-10-CM

## 2012-11-14 NOTE — Telephone Encounter (Signed)
Pt called and requested refill x gabapentin please send rx to [facility].  Thank You   Marines

## 2012-11-14 NOTE — Telephone Encounter (Signed)
Will fwd to Md.  If refill approved, we can call refill in to [facility] pharmacy.  Kirubel Aja, Darlyne Russian, CMA

## 2012-11-15 DIAGNOSIS — H547 Unspecified visual loss: Secondary | ICD-10-CM | POA: Insufficient documentation

## 2012-11-15 NOTE — Assessment & Plan Note (Signed)
A: A1C mildly improved to 9.5 from 10 on 12 units NPH insulin BIDWC and continued metformin 1000mg  BID P: patient met with health educator today in clinic and no barriers were identified  - Increase NPH inuslin to 15 units BID, continue Metformin  - F/u in 1 mon

## 2012-11-15 NOTE — Assessment & Plan Note (Signed)
A: well controlled P: continue current regimen  

## 2012-11-15 NOTE — Assessment & Plan Note (Signed)
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

## 2012-11-16 MED ORDER — GABAPENTIN 300 MG PO CAPS: 300.0000 mg | ORAL_CAPSULE | Freq: Two times a day (BID) | ORAL | Status: DC

## 2012-11-16 NOTE — Telephone Encounter (Signed)
Thank You I will let pt know ° °Marines  °

## 2012-11-16 NOTE — Telephone Encounter (Signed)
Medication refill approved. I printed script and placed in fax pile.

## 2012-11-17 ENCOUNTER — Other Ambulatory Visit: Payer: Self-pay | Admitting: Family Medicine

## 2012-12-20 ENCOUNTER — Ambulatory Visit: Payer: No Typology Code available for payment source | Admitting: Family Medicine

## 2013-01-04 ENCOUNTER — Ambulatory Visit (INDEPENDENT_AMBULATORY_CARE_PROVIDER_SITE_OTHER): Payer: No Typology Code available for payment source | Admitting: Family Medicine

## 2013-01-04 VITALS — BP 117/67 | HR 69 | Temp 98.5°F | Ht 61.5 in | Wt 158.0 lb

## 2013-01-04 DIAGNOSIS — E1165 Type 2 diabetes mellitus with hyperglycemia: Secondary | ICD-10-CM

## 2013-01-04 DIAGNOSIS — H547 Unspecified visual loss: Secondary | ICD-10-CM

## 2013-01-04 DIAGNOSIS — E119 Type 2 diabetes mellitus without complications: Secondary | ICD-10-CM

## 2013-01-04 DIAGNOSIS — Z23 Encounter for immunization: Secondary | ICD-10-CM

## 2013-01-04 DIAGNOSIS — I10 Essential (primary) hypertension: Secondary | ICD-10-CM

## 2013-01-04 MED ORDER — LISINOPRIL 40 MG PO TABS: 40.0000 mg | ORAL_TABLET | Freq: Every day | ORAL | Status: DC

## 2013-01-04 MED ORDER — FUROSEMIDE 20 MG PO TABS: 20.0000 mg | ORAL_TABLET | Freq: Every day | ORAL | Status: DC

## 2013-01-04 MED ORDER — CLONIDINE HCL 0.3 MG PO TABS: 0.3000 mg | ORAL_TABLET | Freq: Two times a day (BID) | ORAL | Status: DC

## 2013-01-04 MED ORDER — NIFEDIPINE ER OSMOTIC RELEASE 60 MG PO TB24: 60.0000 mg | ORAL_TABLET | Freq: Every day | ORAL | Status: DC

## 2013-01-04 MED ORDER — INSULIN NPH (HUMAN) (ISOPHANE) 100 UNIT/ML ~~LOC~~ SUSP: 15.0000 [IU] | Freq: Two times a day (BID) | SUBCUTANEOUS | Status: DC

## 2013-01-04 NOTE — Progress Notes (Signed)
Patient ID: Carol Finley, female   DOB: July 19, 1950, 62 y.o.   MRN: [id number]   Subjective:   Diabetes  Recent Issues: patient last seen on 11/13/12 and diabetes poorly controlled with A1C of 9.5. Do patient increased NPH insulin to 15 units BID and also spoke with our health coach about barrier to improving her glycemic control.   Medication Compliance: Diabetes medication: Yes insulin ONLY TAKING 12 UNITS daily b/c the pharmacist told her not to  Hypoglycemia: symptoms 1 day with blood sugar of 99  Taking ACE-I: Yes lisinopril 40 mg daily  Taking statin: Yes simvastatin 40 mg  Behavioral:  Home CBG Monitoring: No  Diet changes: Yes - less fatty food  Exercise: Yes, walking with granddaughter to school everyday 20 minutes daily   Health Maintenance: Visual problems: Yes - referred to ophthalmologist on 11/13/12 but has not been seen  Last eye exam: 07/24/12  Last dental visit: none  Foot ulcers: No, last exam 02/24/12  Hypertension  Home BP monitoring: no  Office BP: BP Readings from Last 3 Encounters:  01/04/13 117/67  11/13/12 115/70  09/01/12 99/63    Prescribed meds: see below  Hypertension ROS:  Taking medications as prescribed:Yes Chest pain: No Shortness of breath: No Swelling of extremities: No TIA symptoms: No  Current Outpatient Prescriptions on File Prior to Visit  Medication Sig Dispense Refill  . aspirin 81 MG tablet Take 1 tablet (81 mg total) by mouth daily.  30 tablet  5  . cloNIDine (CATAPRES) 0.3 MG tablet Take 1 tablet (0.3 mg total) by mouth 2 (two) times daily.  60 tablet  5  . furosemide (LASIX) 20 MG tablet Take 1 tablet (20 mg total) by mouth daily.  30 tablet  5  . gabapentin (NEURONTIN) 300 MG capsule Take 1 capsule (300 mg total) by mouth 2 (two) times daily.  60 capsule  11  . glucose blood test strip Use as instructed  100 each  12  . hydrochlorothiazide (HYDRODIURIL) 25 MG tablet Take 2 tablets (50 mg total) by mouth daily.   30 tablet  11  . insulin NPH (HUMULIN N) 100 UNIT/ML injection Inject 8 Units into the skin 2 (two) times daily before a meal.  1 vial  12  . lisinopril (PRINIVIL,ZESTRIL) 40 MG tablet Take 1 tablet (40 mg total) by mouth daily.  30 tablet  5  . metFORMIN (GLUCOPHAGE) 1000 MG tablet Take 1 tablet (1,000 mg total) by mouth 2 (two) times daily with a meal.  60 tablet  5  . NIFEdipine (PROCARDIA XL/ADALAT-CC) 60 MG 24 hr tablet Take 1 tablet (60 mg total) by mouth daily.  30 tablet  5  . simvastatin (ZOCOR) 40 MG tablet Take 1 tablet (40 mg total) by mouth at bedtime.  90 tablet  3   No current facility-administered medications on file prior to visit.     Review of Systems:  Pertinent items are noted in HPI.     Objective:   Physical Exam: BP 117/67  Pulse 69  Temp(Src) 98.5 F (36.9 C) (Oral)  Ht 5' 1.5" (1.562 m)  Wt 158 lb (71.668 kg)  BMI 29.37 kg/m2  General: alert, well appearing, and in no distress Eyes: deferred Cardiovascular: RRR, nl S1 and S2 Pulm: normal work of breathing, clear to auscultation bilaterally Feet: warm, good capillary refill, nail exam onychomycosis of the toenails, normal DP and PT pulses and onychomycosis  Labs:   Diabetic Labs:  Lab Results  Component Value Date  HGBA1C 9.5 11/13/2012   HGBA1C 10.0 07/24/2012   HGBA1C 8.9 02/24/2012   Lab Results  Component Value Date   MICROALBUR 30.28* 12/30/2009   LDLCALC 93 05/05/2012   CREATININE 0.80 09/01/2012   Last microalbumin: Lab Results  Component Value Date   MICROALBUR 30.28* 12/30/2009        Assessment & Plan:    Vaccinations for influenza and tetanus given today

## 2013-01-04 NOTE — Assessment & Plan Note (Signed)
Assessment: patient still only taking 12 units of insulin due to incorrect instructions from the pharmacy, all her fasting blood sugars are over 135 therefore she still has room to increase her insulin Plan: increase insulin NPH 15 units twice a day, continue metformin 1000 mg twice a day, encouraged regular exercise and improved eating habits

## 2013-01-04 NOTE — Assessment & Plan Note (Signed)
Assessment: well controlled hypertension Plan: continue clonidine, furosemide, hydrochlorothiazide, lisinopril and nifedipine; consider decreasing doses of medication as tolerated

## 2013-01-04 NOTE — Assessment & Plan Note (Signed)
A referral was made for the patient, but never scheduled, therefore will followup with ophthalmology referral

## 2013-01-04 NOTE — Patient Instructions (Signed)
Fue un Arboriculturist .  Presin Arterial - Por favor, contine los mismos medicamentos  Diabetes - Por favor, empezar a tomar 15 unidades de insulina antes del desayuno y la cena se acaben . Si empiezas a tener niveles bajos de Banker , a continuacin, Corporate investment banker a tomar 12 unidades de nuevo.  Problema Vision - Voy a Journalist, newspaper de programar una cita para usted con Product manager .  Por favor regrese en 2 meses .  Atentamente ,  Dr. Clinton Sawyer

## 2013-02-26 ENCOUNTER — Ambulatory Visit (INDEPENDENT_AMBULATORY_CARE_PROVIDER_SITE_OTHER): Payer: No Typology Code available for payment source | Admitting: Family Medicine

## 2013-02-26 ENCOUNTER — Encounter: Payer: Self-pay | Admitting: Family Medicine

## 2013-02-26 VITALS — BP 111/72 | HR 90 | Temp 99.5°F | Ht 61.5 in | Wt 157.0 lb

## 2013-02-26 DIAGNOSIS — R300 Dysuria: Secondary | ICD-10-CM

## 2013-02-26 DIAGNOSIS — N10 Acute tubulo-interstitial nephritis: Secondary | ICD-10-CM | POA: Insufficient documentation

## 2013-02-26 LAB — POCT UA - MICROSCOPIC ONLY

## 2013-02-26 LAB — POCT URINALYSIS DIPSTICK
Bilirubin, UA: NEGATIVE
Ketones, UA: POSITIVE
Nitrite, UA: NEGATIVE
Protein, UA: 100
pH, UA: 5.5

## 2013-02-26 MED ORDER — CEFTRIAXONE SODIUM 1 G IJ SOLR
1.0000 g | Freq: Once | INTRAMUSCULAR | Status: AC
  Administered 2013-02-26: 1 g via INTRAMUSCULAR

## 2013-02-26 MED ORDER — CEPHALEXIN 500 MG PO CAPS: 500.0000 mg | ORAL_CAPSULE | Freq: Four times a day (QID) | ORAL | Status: DC

## 2013-02-26 NOTE — Patient Instructions (Signed)
Estimada [name],  Creo que usted tiene una infeccin de los riones. Por lo tanto, es necesario tomar los comprimidos de antibiticos 4 veces al da f0r [address]. Por favor, vuelve antes si usted comienza a sentirse mal o tiene nuseas y vmitos severos. Adems, usted debe continuar tomando Tylenol.  Feliz Da de Karl Pock!  Atentamente,

## 2013-02-26 NOTE — Assessment & Plan Note (Signed)
A: acute pyelonephritis based upon history, physical and urinalysis P: - send urine for cx - give ceftriaxone 1 gm IM in clinic - start keflex 500 mg PO QID x 10 days - given specific instructions for return

## 2013-02-26 NOTE — Progress Notes (Signed)
  Subjective:    Patient ID: Carol Finley, female    DOB: [birth year], 62 y.o.   MRN: [id number]  HPI  62 year old F with DM and HTN who presents with dysuria.  Duration: 4 days Back Pain: yes Fever: no Treatments Tried: tried tylenol with mild relief Hx of UTI: no Hx recent instrumentation/foley: no Associated symptoms: abdominal pain Denies nausea, vomiting, diarrhea and constipation  Review of Systems See HPI      Objective:   Physical Exam BP 111/72  Pulse 90  Temp(Src) 99.5 F (37.5 C) (Oral)  Ht 5' 1.5" (1.562 m)  Wt 157 lb (71.215 kg)  BMI 29.19 kg/m2  Gen: elderly Hispanic female, mildly ill appearing but non toxic, non distressed CV: RRR, no murmurs, 2+ peripheral pulses, cap refill < 3 sec Abd: soft, NDNT, generalized tenderness to deep palpation without guarding  Back: CVA tenderness Skin: warm, dry, no tenting       Assessment & Plan:

## 2013-03-28 ENCOUNTER — Encounter: Payer: Self-pay | Admitting: Family Medicine

## 2013-03-28 ENCOUNTER — Ambulatory Visit (INDEPENDENT_AMBULATORY_CARE_PROVIDER_SITE_OTHER): Payer: No Typology Code available for payment source | Admitting: Family Medicine

## 2013-03-28 VITALS — BP 143/75 | HR 73 | Temp 97.9°F | Ht 61.5 in | Wt 162.0 lb

## 2013-03-28 DIAGNOSIS — E1165 Type 2 diabetes mellitus with hyperglycemia: Secondary | ICD-10-CM

## 2013-03-28 NOTE — Progress Notes (Signed)
Patient ID: Carol Finley, female   DOB: Aug 08, 1950, 62 y.o.   MRN: [id number]   Subjective:  Visit conducted in Spanish  Diabetes  Recent Issues:  Hypoglycemia: No   Medication Compliance: Diabetes medication: Yes - NPH 15 units BID, metformin 1000 mg BID Taking ACE-I: Yes Taking statin: Yes  Behavioral: Home CBG Monitoring: Yes Diet changes: No Exercise: No  Health Maintenance: Visual problems: Yes Last eye exam:07/24/12 Last dental visit: No Foot ulcers: No    Review of Systems:  Pertinent items are noted in HPI.     Objective:   Physical Exam: BP 143/75  Pulse 73  Temp(Src) 97.9 F (36.6 C) (Oral)  Ht 5' 1.5" (1.562 m)  Wt 162 lb (73.483 kg)  BMI 30.12 kg/m2  General: alert, well appearing, and in no distress, oriented to person, place, and time and normal appearing weight Eyes: deferred Cardiovascular: RRR, nl S1 and S2 Pulmonary: clear to auscultation bilaterally Feet: warm, good capillary refill and normal sensory exam  Labs:   Diabetic Labs:  Lab Results  Component Value Date   HGBA1C 9.5 11/13/2012   HGBA1C 10.0 07/24/2012   HGBA1C 8.9 02/24/2012   Lab Results  Component Value Date   MICROALBUR 30.28* 12/30/2009   LDLCALC 93 05/05/2012   CREATININE 0.80 09/01/2012   Last microalbumin: Lab Results  Component Value Date   MICROALBUR 30.28* 12/30/2009        Assessment & Plan:

## 2013-03-28 NOTE — Patient Instructions (Signed)
Fue un Arboriculturist.  Diabetes - Por favor, contine con su dosis actual de insulina de 15 [employer]. Tambin continuar la metformina 1000 mg [employer].  Problemas dentales - Voy a hacer una remisin a un dentista. Por lo general toma varios meses para obtener una cita.  Por favor, vuelve en 3 meses.  Judeth Horn!  Dr. Clinton Sawyer

## 2013-04-01 NOTE — Assessment & Plan Note (Signed)
A: improving control with A1c of 7.9 P: continue insulin NPH 15 units BID and metformin 1000 mg BID, will make referral for dental exam

## 2013-04-30 ENCOUNTER — Ambulatory Visit: Payer: No Typology Code available for payment source

## 2013-05-03 ENCOUNTER — Encounter: Payer: Self-pay | Admitting: Family Medicine

## 2013-05-03 ENCOUNTER — Ambulatory Visit (INDEPENDENT_AMBULATORY_CARE_PROVIDER_SITE_OTHER): Payer: No Typology Code available for payment source | Admitting: Family Medicine

## 2013-05-03 VITALS — BP 146/77 | HR 73 | Temp 98.0°F | Resp 18 | Wt 161.0 lb

## 2013-05-03 DIAGNOSIS — L259 Unspecified contact dermatitis, unspecified cause: Secondary | ICD-10-CM | POA: Insufficient documentation

## 2013-05-03 MED ORDER — TRIAMCINOLONE ACETONIDE 0.1 % EX OINT: 1.0000 "application " | TOPICAL_OINTMENT | Freq: Two times a day (BID) | CUTANEOUS | Status: DC

## 2013-05-03 MED ORDER — TRIAMCINOLONE ACETONIDE 0.025 % EX OINT: 1.0000 "application " | TOPICAL_OINTMENT | Freq: Two times a day (BID) | CUTANEOUS | Status: DC

## 2013-05-03 MED ORDER — CETIRIZINE HCL 10 MG PO TABS: 10.0000 mg | ORAL_TABLET | Freq: Every day | ORAL | Status: DC

## 2013-05-03 NOTE — Progress Notes (Signed)
Patient ID: Carol Finley    DOB: 1950-07-13, 63 y.o.   MRN: [id number] --- Subjective:  Carol Finley is a 63 y.o.female who presents for evaluation of rash. Rash has been present for 3 weeks. It itches and is red. She has not used any new soaps, detergents or creams. No new medicines. No different food. She has started wearing a new coat with a high collar. She has had it for 3-4 weeks.  She has tried putting cream on it but it has not helped. Symptoms are unchanged from when she initially had symptoms. No fever, no chills.   Spanish interpreter was present during office visit.  ROS: see HPI Past Medical History: reviewed and updated medications and allergies. Social History: Tobacco: none  Objective: Filed Vitals:   05/03/13 0835  BP: 146/77  Pulse: 73  Temp: 98 F (36.7 C)  Resp: 18    Physical Examination:   General appearance - alert, well appearing, and in no distress Skin - erythematous, thickened and dry patches along anterior neck bilaterally, no scaliness

## 2013-05-03 NOTE — Patient Instructions (Signed)
Dermatitis de contacto (Contact Dermatitis) La dermatitis de contacto es una reaccin a ciertas sustancias que tocan la piel. Puede ser una dermatitis de contacto irritante o alrgica. La dermatitis de contacto irritante no requiere exposicin previa a la sustancia que provoc la reaccin.La dermatitis alrgica slo ocurre si ha estado expuesto anteriormente a la sustancia. Al repetir la exposicin, el organismo reacciona a la sustancia.  CAUSAS  Muchas sustancias pueden causar dermatitis de contacto. La dermatitis irritante se produce cuando hay exposicin repetida a sustancias levemente irritantes, como por ejemplo:   Maquillaje.  Jabones.  Detergentes.  Lavandina.  cidos.  Sales metlicas, como el nquel. Las causas de la dermatitis alrgica son:   Plantas venenosas.  Sustancias qumicas (desodorantes, champs).  Bijouterie.  Ltex.  Neomicina en cremas con triple antibitico.  Conservantes en productos incluyendo en la ropa. SNTOMAS  En la zona de la piel que ha estado expuesta puede haber:   Sequedad o descamacin.  Enrojecimiento.  Grietas.  Picazn.  Dolor o sensacin de ardor.  Ampollas. En el caso de la dermatitis de contacto alrgica, puede haber slo hinchazn en algunas zonas, como la boca o los genitales.  DIAGNSTICO  El mdico podr hacer el diagnstico realizando un examen fsico. En los casos en que la causa es incierta y se sospecha una dermatitis de contacto, le har una prueba en la piel con un parche para determinar la causa de la dermatitis. TRATAMIENTO  El tratamiento incluye la proteccin de la piel de nuevos contactos con la sustancia irritante, evitando la sustancia en lo posible. Puede ser de utilidad colocar una barrera como cremas, polvos y guantes. El mdico tambin podr recomendar:   Cremas o pomadas con corticoides aplicadas 2 veces por da. Para un mejor efecto, humedezca la zona con agua fresca durante 20 minutos. Luego aplique  el medicamento. Cubra la zona con un vendaje plstico. Puede almacenar la crema con corticoides en el refrigerador para tener un efecto "refrescante" sobre la erupcin que har aliviar la picazn. Esto aliviar la picazn. En los casos ms graves ser necesario aplicar corticoides por va oral.  Ungentos con antibiticos o antibacterianos, si hay una infeccin en la piel.  Antihistamnicos en forma de locin o por va oral para calmar la picazn.  Lubricantes para mantener la humectacin de la piel.  La solucin de [place] para reducir el enrojecimiento y el dolor o para secar una erupcin que supura. Mezcle un paquete o tableta en dos tazas de agua fra. Moje un pao limpio en la solucin, escrralo un poco y colquelo en el rea afectada. Djelo en el lugar durante 30 minutos. Repita el procedimiento todas las veces que pueda a lo largo del da.  Hgase baos con almidn o bicarbonato todos los das si la zona es demasiado extensa como para cubrirla con una toallita. Algunas sustancias qumicas, como los lcalis o los cidos pueden daar la piel del mismo modo que una quemadura. Enjuague la piel durante 15 a 20 minutos con agua fra despus de la exposicin a esas sustancias. Tambin busque atencin mdica de inmediato. En los casos de piel muy irritada, ser necesario aplicar (vendajes), antibiticos y analgsicos.  INSTRUCCIONES PARA EL CUIDADO EN EL HOGAR   Evite lo que ha causado la erupcin.  Mantenga el rea de la piel afectada sin contacto con el agua caliente, el jabn, la luz solar, las sustancias qumicas, sustancias cidas o todo lo que la irrite.  No se rasque la lesin. El rascado puede hacer que la   erupcin se infecte.  Puede tomar baos con agua fresca para detener la picazn.  Tome slo medicamentos de venta libre o recetados, segn las indicaciones del mdico.  Consulting civil engineer a las visitas de control segn las indicaciones, para asegurarse de que la piel se est curando  Product manager. SOLICITE ATENCIN MDICA SI:   El problema no mejora luego de 3 das de [place].  Se siente empeorar.  Observa signos de infeccin, como hinchazn, sensibilidad, inflamacin, enrojecimiento o aumenta la temperatura en la zona afectada.  Tiene nuevos problemas debido a los medicamentos. Document Released: 12/30/2004 Document Revised: 06/14/2011 [facility] Patient Information 2014 [place], [place].

## 2013-05-03 NOTE — Assessment & Plan Note (Signed)
Rash consistent with features of contact dermatitis, likely caused by new coat. No systemic symptoms.  - recommended layer between coat and skin when wearing it - triamcinolone 0.1% ointment  - zyrtec 10mg  daily for pruritus - return to care if not better or worst, at which point diagnosis may be questioned and antifungal considered.

## 2013-05-04 ENCOUNTER — Other Ambulatory Visit: Payer: Self-pay | Admitting: Family Medicine

## 2013-05-04 MED ORDER — TRIAMCINOLONE ACETONIDE 0.1 % EX CREA: 1.0000 "application " | TOPICAL_CREAM | Freq: Two times a day (BID) | CUTANEOUS | Status: DC

## 2013-05-04 MED ORDER — DESLORATADINE 5 MG PO TABS: 5.0000 mg | ORAL_TABLET | Freq: Every day | ORAL | Status: DC

## 2013-05-23 ENCOUNTER — Ambulatory Visit: Payer: No Typology Code available for payment source | Admitting: Family Medicine

## 2013-06-15 ENCOUNTER — Ambulatory Visit: Payer: No Typology Code available for payment source | Admitting: Family Medicine

## 2013-06-17 ENCOUNTER — Encounter (HOSPITAL_COMMUNITY): Payer: Self-pay | Admitting: Emergency Medicine

## 2013-06-17 ENCOUNTER — Emergency Department (INDEPENDENT_AMBULATORY_CARE_PROVIDER_SITE_OTHER): Payer: No Typology Code available for payment source

## 2013-06-17 ENCOUNTER — Emergency Department (HOSPITAL_COMMUNITY)
Admission: EM | Admit: 2013-06-17 | Discharge: 2013-06-17 | Disposition: A | Discharge status: Home / Self Care | Payer: No Typology Code available for payment source | Source: Home / Self Care | Attending: Family Medicine | Admitting: Family Medicine

## 2013-06-17 DIAGNOSIS — M5430 Sciatica, unspecified side: Secondary | ICD-10-CM

## 2013-06-17 DIAGNOSIS — M549 Dorsalgia, unspecified: Secondary | ICD-10-CM

## 2013-06-17 HISTORY — DX: Essential (primary) hypertension: I10

## 2013-06-17 HISTORY — DX: Type 2 diabetes mellitus without complications: E11.9

## 2013-06-17 LAB — CBC WITH DIFFERENTIAL/PLATELET
BASOS ABS: 0 10*3/uL (ref 0.0–0.1)
BASOS PCT: 0 % (ref 0–1)
Eosinophils Absolute: 0.1 10*3/uL (ref 0.0–0.7)
Eosinophils Relative: 1 % (ref 0–5)
HCT: 36.8 % (ref 36.0–46.0)
Hemoglobin: 12.7 g/dL (ref 12.0–15.0)
LYMPHS PCT: 24 % (ref 12–46)
Lymphs Abs: 2.1 10*3/uL (ref 0.7–4.0)
MCH: 32 pg (ref 26.0–34.0)
MCHC: 34.5 g/dL (ref 30.0–36.0)
MCV: 92.7 fL (ref 78.0–100.0)
Monocytes Absolute: 0.7 10*3/uL (ref 0.1–1.0)
Monocytes Relative: 8 % (ref 3–12)
NEUTROS PCT: 67 % (ref 43–77)
Neutro Abs: 6 10*3/uL (ref 1.7–7.7)
Platelets: 275 10*3/uL (ref 150–400)
RBC: 3.97 MIL/uL (ref 3.87–5.11)
RDW: 12.9 % (ref 11.5–15.5)
WBC: 8.9 10*3/uL (ref 4.0–10.5)

## 2013-06-17 MED ORDER — NAPROXEN 500 MG PO TABS: 500.0000 mg | ORAL_TABLET | Freq: Two times a day (BID) | ORAL | Status: DC

## 2013-06-17 MED ORDER — HYDROCODONE-ACETAMINOPHEN 5-325 MG PO TABS: 1.0000 | ORAL_TABLET | Freq: Four times a day (QID) | ORAL | Status: DC | PRN

## 2013-06-17 NOTE — ED Provider Notes (Signed)
CSN: [id number]     Arrival date & time 06/17/13  1037 History   First MD Initiated Contact with Patient 06/17/13 1115     Chief Complaint  Patient presents with  . Fall  . Back Pain   (Consider location/radiation/quality/duration/timing/severity/associated sxs/prior Treatment) HPI Comments: 63 year old female presents complaining of progressively worsening back pain for 2 weeks since falling on the ice. She slipped and fell flat onto her back. She did not immediately have any pain, but the soreness began the next day. She has worsening pain and now radiates down her right leg to the bottom of her foot. Her symptoms continued to worsen daily. She has not tried taking anything for this because she is not sure what to take. She also admits to some numbness in the leg. Denies loss of bowel or bladder control. Denies extremity weakness.  Patient is a 63 y.o. female presenting with fall and back pain.  Fall Pertinent negatives include no chest pain, no abdominal pain and no shortness of breath.  Back Pain Associated symptoms: no abdominal pain, no chest pain, no dysuria, no fever and no weakness     Past Medical History  Diagnosis Date  . BACTERIAL PNEUMONIA 10/09/2009    Qualifier: Diagnosis of  By: Huntley Dec, Scott    . Hypertension   . Diabetes mellitus without complication    History reviewed. No pertinent past surgical history. Family History  Problem Relation Age of Onset  . Diabetes Mother   . Hypertension Mother   . Heart disease Mother   . Diabetes Father   . Hypertension Father    History  Substance Use Topics  . Smoking status: Never Smoker   . Smokeless tobacco: Not on file  . Alcohol Use: No   OB History   Grav Para Term Preterm Abortions TAB SAB Ect Mult Living                 Review of Systems  Constitutional: Negative for fever and chills.  Eyes: Negative for visual disturbance.  Respiratory: Negative for cough and shortness of breath.   Cardiovascular:  Negative for chest pain, palpitations and leg swelling.  Gastrointestinal: Negative for nausea, vomiting and abdominal pain.  Endocrine: Negative for polydipsia and polyuria.  Genitourinary: Negative for dysuria, urgency and frequency.  Musculoskeletal: Positive for back pain (and right leg pain). Negative for arthralgias and myalgias.  Skin: Negative for rash.  Neurological: Negative for dizziness, weakness and light-headedness.    Allergies  Review of patient's allergies indicates no known allergies.  Home Medications   Current Outpatient Rx  Name  Route  Sig  Dispense  Refill  . aspirin 81 MG tablet   Oral   Take 1 tablet (81 mg total) by mouth daily.   30 tablet   5   . glucose blood test strip      Use as instructed   100 each   12   . hydrochlorothiazide (HYDRODIURIL) 25 MG tablet   Oral   Take 2 tablets (50 mg total) by mouth daily.   30 tablet   11   . insulin NPH (HUMULIN N) 100 UNIT/ML injection   Subcutaneous   Inject 15 Units into the skin 2 (two) times daily before a meal.   1 vial   12   . lisinopril (PRINIVIL,ZESTRIL) 40 MG tablet   Oral   Take 1 tablet (40 mg total) by mouth daily.   30 tablet   11   . NIFEdipine (PROCARDIA  XL/ADALAT-CC) 60 MG 24 hr tablet   Oral   Take 1 tablet (60 mg total) by mouth daily.   30 tablet   11   . simvastatin (ZOCOR) 40 MG tablet   Oral   Take 1 tablet (40 mg total) by mouth at bedtime.   90 tablet   3   . cephALEXin (KEFLEX) 500 MG capsule   Oral   Take 1 capsule (500 mg total) by mouth 4 (four) times daily.   40 capsule   0     Please print directions in Spanish.   . cloNIDine (CATAPRES) 0.3 MG tablet   Oral   Take 1 tablet (0.3 mg total) by mouth 2 (two) times daily.   60 tablet   11   . desloratadine (CLARINEX) 5 MG tablet   Oral   Take 1 tablet (5 mg total) by mouth daily.   30 tablet   2   . furosemide (LASIX) 20 MG tablet   Oral   Take 1 tablet (20 mg total) by mouth daily.   30  tablet   11   . gabapentin (NEURONTIN) 300 MG capsule   Oral   Take 1 capsule (300 mg total) by mouth 2 (two) times daily.   60 capsule   11   . HYDROcodone-acetaminophen (NORCO) 5-325 MG per tablet   Oral   Take 1 tablet by mouth every 6 (six) hours as needed for moderate pain.   20 tablet   0   . metFORMIN (GLUCOPHAGE) 1000 MG tablet   Oral   Take 1 tablet (1,000 mg total) by mouth 2 (two) times daily with a meal.   60 tablet   5   . naproxen (NAPROSYN) 500 MG tablet   Oral   Take 1 tablet (500 mg total) by mouth 2 (two) times daily.   60 tablet   0   . triamcinolone cream (KENALOG) 0.1 %   Topical   Apply 1 application topically 2 (two) times daily.   30 g   0    BP 152/63  Pulse 95  Temp(Src) 97.8 F (36.6 C) (Oral)  Resp 18  SpO2 100% Physical Exam  Nursing note and vitals reviewed. Constitutional: She is oriented to person, place, and time. Vital signs are normal. She appears well-developed and well-nourished. No distress.  HENT:  Head: Normocephalic and atraumatic.  Pulmonary/Chest: Effort normal. No respiratory distress.  Musculoskeletal:       Lumbar back: She exhibits decreased range of motion, tenderness, bony tenderness (Mild to moderate tenderness over the entire lumbar spine) and pain. She exhibits no swelling and no deformity.  Mild erythema over the lumbar spine  Neurological: She is alert and oriented to person, place, and time. She has normal strength. Coordination normal.  Skin: Skin is warm and dry. No rash noted. She is not diaphoretic.  Psychiatric: She has a normal mood and affect. Judgment normal.    ED Course  Procedures (including critical care time) Labs Review Labs Reviewed  CBC WITH DIFFERENTIAL   Imaging Review Dg Lumbar Spine Complete  06/17/2013   CLINICAL DATA:  Fall 2 weeks ago, continued back pain with left radiculopathy  EXAM: LUMBAR SPINE - COMPLETE 4+ VIEW  COMPARISON:  None.  FINDINGS: No evidence of acute fracture  or malalignment. Multilevel degenerative disc disease most significant at L5-S1 where there is loss of disc space height and minimal grade 1 anterolisthesis of L5 on S1. Lower lumbar facet arthropathy noted L3-L4, L4-L5 and  L5-S1. Multilevel degenerative endplate spurring. Bony mineralization is within normal limits for age. No lytic or blastic osseous lesion. Moderate volume of formed stool in the rectum.  IMPRESSION: 1. No acute fracture or malalignment. 2. Multilevel degenerative disc disease and lower lumbar facet arthropathy. 3. Moderate volume of formed stool in the rectum. Query clinical history of constipation.   Electronically Signed   By: Malachy Moan M.D.   On: 06/17/2013 12:52     MDM   1. Back pain   2. Sciatica    XR shows degenerative changes.  CBC normal.  Will treat symptomatically for back pain with sciatica.  She may have bulging disc, she may need ortho referral if this does not get better.  Will have her f/u with her PCP in 3 days to monitor her progress.  She may return here in the meantime if worsening.    Meds ordered this encounter  Medications  . naproxen (NAPROSYN) 500 MG tablet    Sig: Take 1 tablet (500 mg total) by mouth 2 (two) times daily.    Dispense:  60 tablet    Refill:  0    Order Specific Question:  Supervising Provider    Answer:  Linna Hoff [phone]  . HYDROcodone-acetaminophen (NORCO) 5-325 MG per tablet    Sig: Take 1 tablet by mouth every 6 (six) hours as needed for moderate pain.    Dispense:  20 tablet    Refill:  0    Order Specific Question:  Supervising Provider    Answer:  Bradd Canary D [[id number]]     Graylon Good, PA-C 06/17/13 1313

## 2013-06-17 NOTE — ED Notes (Signed)
Reports falling two weeks ago injuring lower back.  States pain radiates down right leg.  Mild relief with tylenol.

## 2013-06-17 NOTE — ED Provider Notes (Signed)
Medical screening examination/treatment/procedure(s) were performed by resident physician or non-physician practitioner and as supervising physician I was immediately available for consultation/collaboration.   Jaziya Obarr DOUGLAS MD.   Aronda Burford D Ajahni Nay, MD 06/17/13 1451 

## 2013-06-17 NOTE — Discharge Instructions (Signed)
Dolor de espalda en el adulto  (Back Pain, Adult)   El dolor de cintura es frecuente. Aproximadamente 1 de cada 5 personas lo sufren. La causa rara vez pone en peligro la vida. Con frecuencia mejora luego de algún tiempo. Alrededor de la mitad de las personas que sufren un inicio súbito de dolor de cintura, se sentirán mejor luego de 2 semanas. Aproximadamente 8 de cada 10 se sentirán mejor luego de 6 semanas.   CAUSAS   Algunas causas comunes son:   · Distensión de los músculos o ligamentos que sostienen la columna vertebral.  · Desgaste (degeneración) de los discos vertebrales.  · Artritis.  · Traumatismos directos en la espalda.  DIAGNÓSTICO   La mayor parte de las veces, la causa directa no se conoce. Sin embargo, el dolor puede tratarse efectivamente aún cuando no se conozca la causa. Una de las formas más precisas de asegurar que la causa del dolor no constituye un peligro es responder a las preguntas del médico acerca de su salud y sus síntomas. Si el médico necesita más información, podrá indicar análisis de laboratorio o realizar un diagnóstico por imágenes (radiografías o resonancia magnética). Sin embargo, aunque las imágenes muestren modificaciones, generalmente no es necesaria la cirugía.   INSTRUCCIONES PARA EL CUIDADO EN EL HOGAR   En algunas personas, el dolor de espalda vuelve. Como rara vez es peligroso, los pacientes pueden aprender a manejarlo ellos mismos.   · Manténgase activo. Si permanece sentado o de pie mucho tiempo en el mismo lugar, se tensiona la espalda.  No se siente, maneje ni se quede parado en un mismo lugar por más de 30 minutos. Realice caminatas cortas en superficies planas ni bien el dolor haya cedido. Trate de aumentar cada día el tiempo que camina .  · No se quede en la cama. Si hace reposo durante más de 1 o 2 días, puede retrasar la recuperación.  · No evite los ejercicios ni el trabajo. El cuerpo está hecho para moverse. No es peligroso estar activo, aunque le duela la  espalda. La espalda se curará más rápido si continúa sus actividades antes de que el dolor se vaya.  · Preste atención a su cuerpo cuando se incline y se levante. Muchas personas sienten menos molestias cuando levantan objetos si doblan las rodillas, mantienen la carga cerca del cuerpo y evitan torcerse. Generalmente, las posiciones más cómodas son las que ejercen menos tensión en la espalda en recuperación.  · Encuentre una posición cómoda para dormir. Utilice un colchón firme y recuéstese de costado. Doble ligeramente sus rodillas. Si se recuesta sobre su espalda, coloque una almohada debajo de sus rodillas.  · Tome sólo medicamentos de venta libre o recetados, según las indicaciones del médico. Los medicamentos de venta libre para calmar el dolor y reducir la inflamación, son los que en general más ayudan. El médico podrá prescribirle relajantes musculares. Estos medicamentos calman el dolor de modo que pueda retornar a sus actividades normales y a realizar ejercicios saludables.  · Aplique hielo sobre la zona lesionada.  · Ponga el hielo en una bolsa plástica.  · Colóquese una toalla entre la piel y la bolsa de hielo.  · Deje la bolsa de hielo durante 15 a 20 minutos 3 a 4 veces por día, durante los primeros 2 ó 3 días. Luego podrá alternar entre calor y hielo para reducir el dolor y los espasmos.  · Consulte a su médico si puede tratar de hacer ejercicios para la espalda y recibir un masaje suave. Pueden ser beneficiosos.  · Evite sentirse ansioso o   de controlarlos.El ejercicio es una gran opcin. SOLICITE ATENCIN MDICA SI:   Siente un dolor que no se alivia con reposo o medicamentos.  El dolor no mejora en 1 semana.  Desarrolla nuevos sntomas.  No se siente bien en general. SOLICITE ATENCIN MDICA DE INMEDIATO SI:  Siente un dolor  que se irradia desde la espalda hacia sus piernas.  Desarrolla nuevos problemas en el intestino o la vejiga.  Siente debilidad o adormecimiento inusual en sus brazos o piernas.  Presenta nuseas o vmitos.  Presenta dolor abdominal.  Se siente desfalleciente. Document Released: 03/22/2005 Document Revised: 09/21/2011 [facility] Patient Information 2014 [place], [place].  Citica con rehabilitacin (Sciatica with Rehab) El nervio citico va desde la regin inferior de la espalda hacia la pierna y es el responsable de la sensibilidad y el control de los msculos de la parte de atrs (posterior) del muslo, pierna y pie. La citica es una enfermedad caracterizada por una inflamacin en este nervio.  SNTOMAS  Signos de dao al nervio, incluso adormecimiento o debilidad en el lado posterior de las extremidades bajas.  Dolor en la parte posterior del muslo que podra bajar hacia la pierna.  Dolor que [employer] al estar sentado durante largos perodos de [place].  Algunas veces, sensibilidad en las nalgas. CAUSAS La causa de la citica es la inflamacin de los nervios citicos. La inflamacin se debe a que algo irrita el nervio. Entre las causas de la irritacin se encuentran:  Estar sentado durante largos perodos.  Traumatismos directos al nervio.  Artritis de Landscape architect.  Hernia o ruptura de disco.  Deslizamiento de Blenda Bridegroom (espondilolistesis).  Presin de los tejidos blandos, como msculos o el tejidos tipo ligamento (fascia). LOS RIESGOS AUMENTAN CON:  Deportes en los que se presiona la columna (ftbol americano o levantamiento de pesas).  Poca fuerza y flexibilidad.  No hacer un precalentamiento adecuado.  Historia familiar de dolor de cintura o trastornos en discos.  Lesiones o cirugas previas en la espalda.  Mecnica incorrecta del cuerpo, en especial al levantar, o mala postura. PREVENCIN  Precalentamiento adecuado y elongacin antes de la  [place].  Mantener la forma fsica:  [place], flexibilidad y resistencia muscular.  Capacidad cardiovascular.  Conozca y use tcnicas adecuadas, especialmente en posturas y levantamiento. Cuando sea posible, tener un entrenador que corrija la Administrator.  Evite las actividades que tensionen constantemente la columna. PRONSTICO Si se trata adecuadamente, generalmente es curable dentro de las 6 semanas. En ocasiones requiere someterse a [place].  COMPLICACIONES RELACIONADAS  Dao permanente que incluye dolor, adormecimiento, hormigueo o debilidad.  Dolor crnico en la espalda.  Riesgos de la ciruga: infecciones, hemorragias, dao en los nervios, o daos a los tejidos circundantes. TRATAMIENTO El tratamiento inicial incluye interrumpir las actividades que agravan los sntomas. Se incluye el uso de medicamentos y la aplicacin de hielo para reducir Chief Technology Officer y la inflamacin. Los ejercicios de elongacin y fortalecimiento pueden ayudar a reducir Chief Technology Officer con la [place]. Los ejercicios pueden Management consultant o con un terapeuta. Un terapeuta podr recomendarle otros tratamientos, como estimulacin nerviosa electrnica transcutnea (TENS) o ultrasonido. En algunos casos se indica una inyeccin de corticoides para reducir la inflamacin del nervio citico. Si los sntomas persisten por ms de 6 meses de tratamiento no quirrgico (conservador), se Public relations account executive. MEDICAMENTOS   Si necesita analgsicos, se recomiendan los antiinflamatorios no esteroides, como aspirina e ibuprofeno y otros calmantes menores, como acetaminofeno.  No tome medicamentos para el dolor dentro de  los [address] previos a la [place].  Los analgsicos prescriptos se indicarn si el mdico lo considera necesario. Utilcelos como se le indique y slo cuando lo necesite.  Los ungentos pueden ser beneficiosos.  En algunos casos se indica una inyeccin de corticosteroides. Estas inyecciones deben  reservarse para los [place] graves, porque slo se pueden administrar una determinada cantidad de veces. CALOR Y FRO   El tratamiento con fro [employer] y reduce la inflamacin. El fro debe aplicarse durante 10 a 15 minutos cada 2  3 horas para reducir la inflamacin y Chief Technology Officer e inmediatamente despus de cualquier actividad que agrava los sntomas. Utilice bolsas de hielo o masajee la zona con un trozo de hielo (masaje de hielo).  El calor puede usarse antes de Therapist, music y de las actividades de fortalecimiento indicadas por el profesional, le fisioterapeuta o Orthoptist. Utilice una bolsa trmica o sumerja la lesin en agua caliente. SOLICITE ATENCIN MDICA SI:  El tratamiento parece no ofrecer beneficios, o el trastorno [employer].  Los medicamentos producen efectos secundarios. EJERCICIOS EJERCICIOS DE AMPLITUD DE MOVIMIENTOS Y ELONGACIN Citica La mayora de las personas con dolor de citica encuentran que sus sntomas empeoran con el delantero excesiva flexin (flexin) o arco en la espalda baja (extensin). Los ejercicios que le ayudarn a Oncologist sus sntomas se Research scientist (life sciences). El mdico, fisioterapeuta o Furniture conservator/restorer a Chief Strategy Officer qu ejercicios sern de ayuda para resolver su dolor de espalda. No realice ningn ejercicio sin consultarlo antes con el profesional. Discontinue los ejercicios que empeoran sus sntomas, hasta que hable con el mdico. Si siente dolor, entumecimiento u hormigueo que [employer] glteos, piernas o pies, el objetivo de esta terapia es que estos sntomas se acerquen a la espalda y Customer service manager. En ocasiones, los sntomas de la pierna mejorarn, Development worker, international aid en la espalda puede empeorar; esto es un indicio tpico del progreso en la rehabilitacin. Asegrese de que estar atento a cualquier cambio en sus sntomas y las actividades que ha [employer]	[place] 24 horas antes del cambio. Compartir esta informacin con su  mdico le permitir un mejor tratamiento para tratar su enfermedad. Estos ejercicios le ayudarn en la recuperacin de la lesin. Los sntomas podrn aliviarse con o sin asistencia adicional de su mdico, fisioterapeuta o Herbalist. Al completar estos ejercicios, recuerde:   Restaurar la flexibilidad del tejido ayuda a que las articulaciones recuperen el movimiento normal. Esto permite que el movimiento y la actividad sea ms saludables y menos dolorosos.  Para que sea efectiva, cada elongacin debe realizarse durante al menos 30 segundos.  La elongacin nunca debe ser dolorosa. Deber sentir slo un alargamiento o distensin suave del tejido que estira. EJERCICIOS DE AMPLITUD DE MOVIMIENTOS Y ELONGACIN: ELONGACION Flexin - una rodilla al pecho  Recustese en una cama dura o sobre el piso, con ambas piernas extendidas al frente.  Manteniendo una pierna en contacto con el piso, lleve la rodilla opuesta al pecho. Mantenga la pierna en esa posicin, sostenindola por la zona posterior del muslo o por la rodilla.  Presione hasta sentir un suave estiramiento en la cintura. Mantenga esta posicin durante __________ segundos.  Libere la pierna lentamente y repita el ejercicio con el lado opuesto. Reptalo __________ veces. Realice este estiramiento __________ Anthoney Harada por da.  ELONGACIN Flexin, dos rodillas al pecho   Recustese en una cama dura o sobre el piso, con ambas piernas extendidas al frente.  Manteniendo una pierna en contacto con el piso,  lleve la rodilla opuesta al pecho.  Tense los msculos del estmago para apoyar la espalda y levante la otra rodilla [place]. Mantenga las piernas en su lugar y tmese por detrs de las caderas o las rodillas.  Con ambas rodillas en el pecho, tire hasta que sienta un estiramiento en la parte trasera de la espalda. Mantenga esta posicin durante __________ segundos.  Tense los msculos del estmago y baje las piernas de a una por  vez. Reptalo __________ veces. Realice este estiramiento __________ Anthoney Harada por da.  ELONGACIN Rotacin de la zona baja del tronco.  Recustese sobre una cama firme o sobre el suelo. Mantenga las piernas al frente, doble las rodillas de modo que ambas apunten hacia el techo y los pies queden bien apoyados en el piso.  Extienda los brazos a Dance movement psychotherapist. Esto estabilizar la zona superior del cuerpo, manteniendo los hombros en contacto con el piso.  Con cuidado y lentamente deje caer ambas rodillas juntas hacia un lado, hasta que sienta un suave estiramiento en la espalda baja. Mantenga esta posicin durante __________ segundos.  Tense los msculos del estmago para apoyar la espalda y lleve las rodillas a la posicin inicial. Repita el ejercicio hacia el otro lado. Reptalo __________ veces. Realice este ejercicio __________ veces por da. EJERCICIOS DE AMPLITUD DE MOVIMIENTOS Y FLEXIBILIDAD: ELONGACIN Extensin posicin prona sobre los codos  Acustese sobre el estmago sobre el piso, una cama ser muy blanda. Coloque las palmas a una distancia igual al ancho de los hombres y a la altura de la cabeza.  Coloque los codos bajo los hombros. Si siente dolor, colquese almohadas debajo del pecho.  Deje que su cuerpo se relaje, de modo que las caderas queden ms abajo y tengan ms contacto con el piso.  Mantenga esta posicin durante __________ segundos.  Vuelva lentamente a la posicin plana sobre el piso. Reptalo __________ veces. Realice este estiramiento __________ Anthoney Harada por da.  AMPLITUD DE MOVIMIENTOS - Extensin - flexin de brazos en posicin prona  Acustese sobre el [employer]	[place] piso, una cama ser [place]. Coloque las palmas a una distancia igual al ancho de los hombres y a la altura de la cabeza.  Mantenga la espalda tan relajada como pueda, enderece lentamente los codos [address] las caderas contra el suelo. Puede modificar la posicin de las manos para estar ms  cmodo. A medida que gana movimiento, sus manos quedarn ms por debajo de los hombros.  Mantenga cada posicin durante __________ segundos.  Vuelva lentamente a la posicin plana sobre el piso. Reptalo __________ veces. Realice este estiramiento __________ Anthoney Harada por da.  EJERCICIOS DE FORTALECIMIENTO - Citica Estos ejercicios le ayudarn en la recuperacin de la lesin. Estos ejercicios deben hacerse cerca de su "punto dulce". Este es el arco neutro, de la parte baja de la espalda, en algn lugar entre la posicin completamente redondeada y arqueada plenamente, que es la posicin menos dolorosa. Cuando se realiza en [employer] de seguridad del movimiento, estos ejercicios se pueden Chemical engineer para las personas que tienen una lesin basada en flexin o extensin. Con estos ejercicios, los sntomas podrn desaparecer con o sin mayor intervencin del profesional, el fisioterapeuta o Orthoptist. Al completar estos ejercicios, recuerde:   Los msculos pueden ganar tanto la resistencia como la fuerza que necesita para sus actividades diarias a travs de ejercicios controlados.  Realice los ejercicios como se lo indic el mdico, el fisioterapeuta o Orthoptist. [place] slo con los ejercicios de resistencia y  haga las repeticiones que su mdico le indique.  Podr experimentar dolor o cansancio muscular, pero el dolor o molestia que trata de eliminar a travs de los ejercicios nunca debe empeorar. Si el dolor empeora, detngase y asegrese de que est siguiendo las directivas correctamente. Si an siente dolor luego de Education officer, environmentalrealizar lo ajustes necesarios, deber discontinuar el ejercicio hasta que pueda conversar con el profesional sobre el problema. FORTALECIMIENTO - Abdominales profundos - Inclinacin plvica  Recustese sobre una cama firme o sobre el suelo. Mantenga las piernas al frente, doble las rodillas de modo que ambas apunten hacia el techo y los pies queden bien apoyados en el piso.  Tensione  la zona baja de los msculos abdominales para presionar la Oncologistcintura contra el piso. Este movimiento har rotar su pelvis de modo que el cccix quede hacia arriba y no apuntando a los pies o hacia el piso.  Con una tensin suave y respiracin pareja, mantenga esta posicin durante __________ segundos. Reptalo __________ veces. Realice este estiramiento __________ Anthoney Haradaveces por da.  FORTALECIMIENTO - Abdominales encogimiento abdominal  Recustese sobre una cama firme o sobre el suelo. Mantenga las piernas al frente, doble las rodillas de modo que ambas apunten hacia el techo y los pies queden bien apoyados en el piso. Cruce las Googlemanos sobre el pecho.  Apunte suavemente con la barbilla hacia abajo, sin doblar el cuello.  Tensione los abdominales y eleve lentamente el tronco la altura suficiente para despegar los omplatos. Si se eleva ms, pondr tensin excesiva en la cintura y esto no fortalecer ms los abdominales.  Controle la vuelta a la posicin inicial. Reptalo __________ veces. Realice este estiramiento __________ Anthoney Haradaveces por da.  FORTALECIMIENTO - En cuadrpedo, elevacin de miembro superior e inferior opuestos  The Mosaic CompanyColoque las manos y las rodillas en una superficie firme. Las manos deben quedar a la altura de los hombros y las rodillas debajo de las caderas. Puede colocar algo debajo las rodillas para estar ms cmodo.  Encuentre la posicin neutral de la columna vertebral y Public house managertensione ligeramente los msculos abdominales de modo que pueda mantener esta posicin. Los hombros y las caderas deben formar un rectngulo paralelo con el suelo y recto.  Manteniendo el tronco firme, eleve la mano derecha a la altura del hombro y luego eleve la pierna izquierda a la altura de la cadera. Asegrese de no contener la respiracin. Mantenga cada posicin durante __________ segundos.  Con los msculos abdominales en tensin y la espalda firme, vuelva lentamente a la posicin inicial. Repita con el otro brazo y  la otra pierna. Reptalo __________ veces. Realice este estiramiento __________ Anthoney Haradaveces por da.  FUERZA Abdominales y cudriceps - LexicographerLevantar las piernas rectas  Recustese en una cama dura o sobre el piso, con ambas piernas extendidas al frente.  Deje una pierna en contacto con el suelo y doble la otra rodilla de manera que el pie quede contra el suelo.  Encuentre la posicin neutral de la columna vertebral y Public house managertensione ligeramente los msculos abdominales de modo que pueda mantener esta posicin.  Levante lentamente la pierna del suelo una 6 pulgadas y cuente [place], asegrese de no contener la respiracin.  Mantega la columna en posicin neutral, y baje lentamente la pierna hasta el suelo. Repita el ejercicio con cada pierna __________ veces. Realice este estiramiento __________ Anthoney Haradaveces por da. CONSIDERACIONES ACERCA DE LA POSTURA Y LA MECNICA DEL CUERPO Citica Si mantiene una postura correcta cuando se encuentre de pie, sentado o realizando sus actividades, reducir Development worker, communityel estrs en  los diferentes tejidos del cuerpo, y permitir a los tejidos lesionados la posibilidad de curarse y Film/video editor las experiencias dolorosas. A continuacin se indican pautas generales para mejorar la postura- Su mdico o fisioterapeuta le dar instrucciones especficas segn sus necesidades. Al leer estas pautas recuerde:  Los ejercicios indicados por su mdico lo ayudarn a Chartered certified accountant flexibilidad y la fuerza para Pharmacologist las posturas correctas.  Una postura correcta le proporciona a sus articulaciones el medio ptimo para funcionar bien. Las articulaciones se desgastan menos cuando estn sostenidas adecuadamente por una columna vertebral en buena postura. Esto significa que su cuerpo estar ms sano y Research officer, trade union.  La correcta postura debe practicarse en todas las actividades, especialmente al estar sentado o de pie durante [place]. Tambin es importante al realizar actividades repetitivas de bajo estrs (tipeo) o  una actividad nica y pesada. POSICIONES DE Dellie Catholic Tenga en cuenta cules son las posturas que ms dolor le causan al elegir una posicin de descanso. Si siente dolor con las actividades en que deba realizar una flexin (sentarse, inclinarse, detenerse, ponerse en cuclillas), elija una posicin que le permita descansar en una postura menos flexionada. Evite curvarse en posicin fetal cuando se encuentre de lado. Si el dolor empeora con las actividades basadas en la extensin (estar de pie durante un tiempo prolongado, trabajar con las manos por arriba de la cabeza) evite descansar en Neomia Dear posicin extendida durante mucho tiempo, como dormir sobre el [place]. [place] [employer] de las Psychologist, forensic cmodo el descanso sobre la columna vertebral en una posicin neutral, ni muy redondeada ni [place]. Recustese sobre su lado en una cama que no est hundida con una almohada entre las rodillas o sobre la espalda con una almohada bajo las rodillas, y sentir [place]. Tenga en cuenta que cualquier posicin en [employer], no importa si es una postura [place], puede provocarle rigidez. POSTURAS CORRECTAS PARA SENTARSE Con el fin de minimizar el estrs y Environmental health practitioner en su columna, deber sentarse con la postura correcta. Esto le ayudar a que el cuerpo est ms sano. Recuperar una buena postura es un proceso gradual. Muchas personas pueden trabajar ms cmodas mediante el uso de diferentes soportes hasta que tengan la flexibilidad y la fuerza para mantener esta postura por su cuenta. Al sentarse con la [employer], los odos deben estar sobre los hombros y los hombros sobre las caderas. Debe utilizar el respaldo de la silla para apoyar la espalda. La espalda estar en una posicin neutral, ligeramente arqueada. Puede colocar una pequea almohada o toalla doblada en la base de la espalda baja para apoyarse.  Si trabaja en un escritorio, cree un ambiente que le proporciones un buen soporte y [place]. Sin soporte extra, los msculos se fatigan y causan tensin excesiva en las articulaciones y otros tejidos. Tenga en cuenta estas recomendaciones: SILLA:   La silla debe poder deslizarse por debajo del escritorio cuando su espalda tome contacto con el respaldo. Esto le permitir trabajar ms cerca.  La altura de la silla debe permitirle que los ojos tengan el nivel de la parte superior del monitor y las manos estn ms abajo que los codos. POSICIN DEL CUERPO  Los pies deben tener contacto con el piso. Si no es posible, use un posapies.  Mantenga las [employer] hombros. Esto reducir el estrs en el cuello y en la cintura. POSTURAS INCORRECTAS PARA SENTARSE  Si se siente cansado e incapaz de asumir una postura sentada sana, no se encorve ni  se hunda. Esto pone una tensin excesiva en los tejidos de su espalda, y causa ms dao y Engineer, mining.Entre las opciones ms saludables se incluyen:  El uso de ms apoyo, como una almohada lumbar.  Cambio de tareas, a algo que demande una posicin vertical o caminar.  Tomar una breve caminata.  Recostarse y Theatre stage manager posicin neutral. DE PIE DURANTE UN TIEMPO PROLONGADO E INCLINADO LIGERAMENTE HACIA ADELANTE Cuando deba realizar una tarea que requiera inclinacin hacia adelante estando de pie en el mismo sitio durante mucho tiempo, coloque un pie en un objeto de 2 a 4 pulgadas de alto, para [employer]. Cuando ambos pies estn en el piso, la zona inferior de la espalda tiene a perder su ligera curvatura hacia adentro. Si esta curva se aplana (o se pronuncia demasiado) la espalda y las articulaciones experimentarn demasiado estrs, se fatigarn ms rpidamente y Geophysicist/field seismologist.  POSTURAS CORRECTAS PARA ESTAR DE PIE Una postura adecuada de pie realizarse en todas las actividades diarias, incluso si slo toman un momento, como al [employer]. Como en la postura de sentado, los odos deben estar sobre los hombros y  los hombros sobre las caderas. Deber mantener una ligera tensin en sus msculos abdominales para asegurar la columna vertebral. El cccix debe apuntar hacia el suelo, no detrs de su cuerpo, por que resulta en una curvatura de la espalda sobre-extendida.  POSTURAS INCORRECTAS PARA ESTAR DE PIE Las posturas incorrectas para estar de pie incluyen tener la cabeza hacia delante, las rodillas bloqueadas o una excesiva curvatura de la espalda. CAMINAR [place] en Deborha Payment erguida. [place], hombros y caderas deben estar alineados. ACTIVIDAD PROLONGADA EN UNA POSICIN FLEXIONADA Al completar una tarea que requiere que se doble la cintura hacia adelante o inclinarse sobre una superficie baja, trate de encontrar una manera de estabilizar 3 de cada 4 de sus miembros. Puede colocar una mano o el codo en el [place], o descansar una rodilla en la superficie en la que est apoyado. Esto le proporcionar ms estabilidad para que sus msculos no se cansen tan rpidamente. El [employer] rodillas [place], o ligeramente dobladas, tambin reducir el estrs en la espalda baja. TCNICAS CORRECTAS PARA LEVANTAR OBJETOS SI:   Asumir una postura amplia. Esto le proporcionar ms estabilidad y la oportunidad de acercarse lo ms posible al objeto que se est levantando.  Tense los abdominales para asegurar la columna vertebral; luego flexione rodillas y caderas. Manteniendo la espalda en una posicin neutral, haga el esfuerzo con los msculos de la pierna. Levntese con las piernas, manteniendo la espalda derecha.  Pruebe el peso de los objetos desconocidos antes de tratar de Secondary school teacher.  Trate de [employer] codos hacia abajo y a los lados, con el fin de obtener la fuerza de los hombros al llevar un objeto.  Siempre pida ayuda a otra persona cuando deba levantar objetos pesados o incmodos TCNICAS INCORRECTAS PARA LEVANTAR OBJETOS NO:   Bloquee rodillas al levantar, aunque sea un objeto pequeo.  Se  doble ni gire. Gire sobre los pies ni los mueva cuando necesite cambiar de direccin.  Tome conciencia que no puede levantar con seguridad ni un clip de papel, sin Clinical biochemist. Document Released: 03/10/2009 Document Revised: 06/14/2011 [facility] Patient Information 2014 [place], [place].

## 2013-06-19 ENCOUNTER — Ambulatory Visit (INDEPENDENT_AMBULATORY_CARE_PROVIDER_SITE_OTHER): Payer: No Typology Code available for payment source | Admitting: Family Medicine

## 2013-06-19 VITALS — BP 149/63 | HR 80 | Temp 98.4°F | Ht 61.5 in | Wt 162.0 lb

## 2013-06-19 DIAGNOSIS — M5430 Sciatica, unspecified side: Secondary | ICD-10-CM

## 2013-06-19 DIAGNOSIS — IMO0002 Reserved for concepts with insufficient information to code with codable children: Secondary | ICD-10-CM

## 2013-06-19 DIAGNOSIS — E1165 Type 2 diabetes mellitus with hyperglycemia: Secondary | ICD-10-CM

## 2013-06-19 DIAGNOSIS — IMO0001 Reserved for inherently not codable concepts without codable children: Secondary | ICD-10-CM

## 2013-06-19 DIAGNOSIS — M25561 Pain in right knee: Secondary | ICD-10-CM

## 2013-06-19 DIAGNOSIS — M25569 Pain in unspecified knee: Secondary | ICD-10-CM

## 2013-06-19 LAB — POCT GLYCOSYLATED HEMOGLOBIN (HGB A1C): Hemoglobin A1C: 7.5

## 2013-06-19 MED ORDER — METHYLPREDNISOLONE ACETATE 40 MG/ML IJ SUSP
40.0000 mg | Freq: Once | INTRAMUSCULAR | Status: AC
  Administered 2013-06-19: 40 mg via INTRA_ARTICULAR

## 2013-06-19 NOTE — Patient Instructions (Signed)
Carol Finley,  Fue agradable verte hoy. El dolor est teniendo es de su nervio citico. Esto va a mejorar en unas pocas semanas, as que por favor sea paciente. El dolor en su rodilla debe mejorar con el tiro de esteroides. Por favor devuelva en 3-4 semanas.  Dr. Clinton Sawyer

## 2013-06-19 NOTE — Progress Notes (Signed)
Subjective:    Patient ID: Carol Finley, female    DOB: 08/31/1950, 63 y.o.   MRN: [id number]  HPI  63 year old female with history of type 2 diabetes, hypertension, and arthritis who presents for evaluation of low back pain.  Low back pain and knee pain - the pain started approximately 2 weeks ago when the patient fell on ice outside of her home. The soreness was worsening in her lower back and causing radiating pain down her right leg to her foot. She was evaluated for this pain at the urgent care 2 days ago.she was diagnosed with sciatica and given a prescription for naproxen and hydrocodone to use as needed. Today she reports the pain is persistent but improving. He relates from her lower back down her right leg to her right foot. Additionally she has pain in her right knee accompanied by swelling but she thinks it is making it harder for her to walk. She is taking the hydrocodone every 6 hours and the naproxen with meals. When she takes these medications it helps her pain.   Current Outpatient Prescriptions on File Prior to Visit  Medication Sig Dispense Refill  . aspirin 81 MG tablet Take 1 tablet (81 mg total) by mouth daily.  30 tablet  5  . cephALEXin (KEFLEX) 500 MG capsule Take 1 capsule (500 mg total) by mouth 4 (four) times daily.  40 capsule  0  . cloNIDine (CATAPRES) 0.3 MG tablet Take 1 tablet (0.3 mg total) by mouth 2 (two) times daily.  60 tablet  11  . desloratadine (CLARINEX) 5 MG tablet Take 1 tablet (5 mg total) by mouth daily.  30 tablet  2  . furosemide (LASIX) 20 MG tablet Take 1 tablet (20 mg total) by mouth daily.  30 tablet  11  . gabapentin (NEURONTIN) 300 MG capsule Take 1 capsule (300 mg total) by mouth 2 (two) times daily.  60 capsule  11  . glucose blood test strip Use as instructed  100 each  12  . hydrochlorothiazide (HYDRODIURIL) 25 MG tablet Take 2 tablets (50 mg total) by mouth daily.  30 tablet  11  . HYDROcodone-acetaminophen (NORCO) 5-325  MG per tablet Take 1 tablet by mouth every 6 (six) hours as needed for moderate pain.  20 tablet  0  . insulin NPH (HUMULIN N) 100 UNIT/ML injection Inject 15 Units into the skin 2 (two) times daily before a meal.  1 vial  12  . lisinopril (PRINIVIL,ZESTRIL) 40 MG tablet Take 1 tablet (40 mg total) by mouth daily.  30 tablet  11  . metFORMIN (GLUCOPHAGE) 1000 MG tablet Take 1 tablet (1,000 mg total) by mouth 2 (two) times daily with a meal.  60 tablet  5  . naproxen (NAPROSYN) 500 MG tablet Take 1 tablet (500 mg total) by mouth 2 (two) times daily.  60 tablet  0  . NIFEdipine (PROCARDIA XL/ADALAT-CC) 60 MG 24 hr tablet Take 1 tablet (60 mg total) by mouth daily.  30 tablet  11  . simvastatin (ZOCOR) 40 MG tablet Take 1 tablet (40 mg total) by mouth at bedtime.  90 tablet  3  . triamcinolone cream (KENALOG) 0.1 % Apply 1 application topically 2 (two) times daily.  30 g  0   No current facility-administered medications on file prior to visit.     Review of Systems Negative for numbness of her private parts, urinary retention, weakness of her extremities    Objective:   Physical Exam  BP 149/63  Pulse 80  Temp(Src) 98.4 F (36.9 C) (Oral)  Ht 5' 1.5" (1.562 m)  Wt 162 lb (73.483 kg)  BMI 30.12 kg/m2 Gen: eldlery hispanic female, non distressed Back:  Appearance: sciolosis no Palpation: tenderness of paraspinal muscles no, spinous process no; pelvis no  Flexion: limited Extension: limited  Right Hip:  Rotation Reduced: internal no, external no  Right Knee:  Moderate swelling and tenderness to palpation along right joint line, no laxity  Neuro: Strength hip flexion 5/5, hip abduction 5/5, hip adduction 5/5,  knee extension 5/5, knee flexion 5/5, dorsiflexion 5/5, plantar flexion 5/5 bilaterally Reflexes: patella 2/2 Bilateral  Achilles 2/2 Bilateral Straight Leg Raise: negative Sensation to light touch intact: yes     Dg Lumbar Spine Complete  06/17/2013   CLINICAL DATA:   Fall 2 weeks ago, continued back pain with left radiculopathy  EXAM: LUMBAR SPINE - COMPLETE 4+ VIEW  COMPARISON:  None.  FINDINGS: No evidence of acute fracture or malalignment. Multilevel degenerative disc disease most significant at L5-S1 where there is loss of disc space height and minimal grade 1 anterolisthesis of L5 on S1. Lower lumbar facet arthropathy noted L3-L4, L4-L5 and L5-S1. Multilevel degenerative endplate spurring. Bony mineralization is within normal limits for age. No lytic or blastic osseous lesion. Moderate volume of formed stool in the rectum.  IMPRESSION: 1. No acute fracture or malalignment. 2. Multilevel degenerative disc disease and lower lumbar facet arthropathy. 3. Moderate volume of formed stool in the rectum. Query clinical history of constipation.   Electronically Signed   By: Malachy Moan M.D.   On: 06/17/2013 12:52        Assessment & Plan:  See problem specific assessment and plan. Procedure note for knee injection below.   Procedure: Knee Injection  Indication: Pain Relief Consent Obtained: yes  Laterality: Right Approach: medial Area cleaned with alcohol. Skin anesthetized with topical refrigerant anesthetic spray. Joint injected with 40 mg depomedrol and 3 mL of 1% lidocaine. Bandage placed.  Complications: None Estimated Blood Loss: < 5 mL Patient tolerated procedure well.

## 2013-06-20 DIAGNOSIS — G629 Polyneuropathy, unspecified: Secondary | ICD-10-CM | POA: Insufficient documentation

## 2013-06-20 DIAGNOSIS — M25561 Pain in right knee: Secondary | ICD-10-CM | POA: Insufficient documentation

## 2013-06-20 NOTE — Assessment & Plan Note (Signed)
Assessment: sciatica improving Plan: continue naproxen and hydrocodone when necessary; patient counseled on clinical course and likely resolution within several weeks

## 2013-06-20 NOTE — Assessment & Plan Note (Signed)
Assessment: knee pain likely multifactorial from degenerative joint disease as well as ligamentous strain during her fall on the ice; the exam negative for concern of ligamentous tear or fracture Plan: steroid injection for pain relief as per procedure note; followup when necessary

## 2013-07-26 ENCOUNTER — Other Ambulatory Visit: Payer: Self-pay | Admitting: Family Medicine

## 2013-07-26 DIAGNOSIS — Z1231 Encounter for screening mammogram for malignant neoplasm of breast: Secondary | ICD-10-CM

## 2013-07-31 ENCOUNTER — Ambulatory Visit (HOSPITAL_COMMUNITY)
Admission: RE | Admit: 2013-07-31 | Discharge: 2013-07-31 | Disposition: A | Discharge status: Home / Self Care | Payer: No Typology Code available for payment source | Source: Ambulatory Visit | Attending: Family Medicine | Admitting: Family Medicine

## 2013-07-31 ENCOUNTER — Encounter (INDEPENDENT_AMBULATORY_CARE_PROVIDER_SITE_OTHER): Payer: Self-pay

## 2013-07-31 DIAGNOSIS — Z1231 Encounter for screening mammogram for malignant neoplasm of breast: Secondary | ICD-10-CM | POA: Insufficient documentation

## 2013-08-21 ENCOUNTER — Other Ambulatory Visit: Payer: Self-pay | Admitting: *Deleted

## 2013-08-21 DIAGNOSIS — E1165 Type 2 diabetes mellitus with hyperglycemia: Secondary | ICD-10-CM

## 2013-08-22 MED ORDER — INSULIN NPH (HUMAN) (ISOPHANE) 100 UNIT/ML ~~LOC~~ SUSP: 15.0000 [IU] | Freq: Two times a day (BID) | SUBCUTANEOUS | Status: DC

## 2013-08-30 ENCOUNTER — Other Ambulatory Visit: Payer: Self-pay | Admitting: *Deleted

## 2013-08-30 DIAGNOSIS — E1165 Type 2 diabetes mellitus with hyperglycemia: Secondary | ICD-10-CM

## 2013-08-30 MED ORDER — INSULIN NPH (HUMAN) (ISOPHANE) 100 UNIT/ML ~~LOC~~ SUSP: 15.0000 [IU] | Freq: Two times a day (BID) | SUBCUTANEOUS | Status: DC

## 2013-09-07 ENCOUNTER — Ambulatory Visit: Payer: No Typology Code available for payment source | Admitting: Family Medicine

## 2013-09-25 ENCOUNTER — Encounter: Payer: Self-pay | Admitting: Emergency Medicine

## 2013-09-25 ENCOUNTER — Ambulatory Visit (INDEPENDENT_AMBULATORY_CARE_PROVIDER_SITE_OTHER): Payer: No Typology Code available for payment source | Admitting: Emergency Medicine

## 2013-09-25 VITALS — BP 127/73 | HR 85 | Temp 98.1°F | Ht 61.5 in | Wt 161.0 lb

## 2013-09-25 DIAGNOSIS — E1140 Type 2 diabetes mellitus with diabetic neuropathy, unspecified: Secondary | ICD-10-CM

## 2013-09-25 DIAGNOSIS — I10 Essential (primary) hypertension: Secondary | ICD-10-CM

## 2013-09-25 DIAGNOSIS — IMO0001 Reserved for inherently not codable concepts without codable children: Secondary | ICD-10-CM

## 2013-09-25 DIAGNOSIS — E1149 Type 2 diabetes mellitus with other diabetic neurological complication: Secondary | ICD-10-CM

## 2013-09-25 DIAGNOSIS — E1142 Type 2 diabetes mellitus with diabetic polyneuropathy: Secondary | ICD-10-CM

## 2013-09-25 DIAGNOSIS — E1165 Type 2 diabetes mellitus with hyperglycemia: Secondary | ICD-10-CM

## 2013-09-25 DIAGNOSIS — IMO0002 Reserved for concepts with insufficient information to code with codable children: Secondary | ICD-10-CM

## 2013-09-25 LAB — COMPLETE METABOLIC PANEL WITH GFR
ALBUMIN: 4.1 g/dL (ref 3.5–5.2)
ALT: 18 U/L (ref 0–35)
AST: 22 U/L (ref 0–37)
Alkaline Phosphatase: 102 U/L (ref 39–117)
BILIRUBIN TOTAL: 0.6 mg/dL (ref 0.2–1.2)
BUN: 23 mg/dL (ref 6–23)
CHLORIDE: 97 meq/L (ref 96–112)
CO2: 26 mEq/L (ref 19–32)
Calcium: 9.9 mg/dL (ref 8.4–10.5)
Creat: 0.74 mg/dL (ref 0.50–1.10)
GFR, EST NON AFRICAN AMERICAN: 86 mL/min
GLUCOSE: 185 mg/dL — AB (ref 70–99)
Potassium: 4.1 mEq/L (ref 3.5–5.3)
SODIUM: 137 meq/L (ref 135–145)
TOTAL PROTEIN: 7.6 g/dL (ref 6.0–8.3)

## 2013-09-25 LAB — POCT GLYCOSYLATED HEMOGLOBIN (HGB A1C): HEMOGLOBIN A1C: 7.9

## 2013-09-25 LAB — LIPID PANEL
Cholesterol: 137 mg/dL (ref 0–200)
HDL: 44 mg/dL (ref >39)
LDL CALC: 47 mg/dL (ref 0–99)
Total CHOL/HDL Ratio: 3.1 Ratio
Triglycerides: 228 mg/dL — ABNORMAL HIGH (ref <150)
VLDL: 46 mg/dL — ABNORMAL HIGH (ref 0–40)

## 2013-09-25 MED ORDER — HYDROCHLOROTHIAZIDE 25 MG PO TABS: 50.0000 mg | ORAL_TABLET | Freq: Every day | ORAL | Status: DC

## 2013-09-25 MED ORDER — SIMVASTATIN 40 MG PO TABS: 40.0000 mg | ORAL_TABLET | Freq: Every day | ORAL | Status: DC

## 2013-09-25 MED ORDER — NIFEDIPINE ER OSMOTIC RELEASE 60 MG PO TB24: 60.0000 mg | ORAL_TABLET | Freq: Every day | ORAL | Status: DC

## 2013-09-25 NOTE — Progress Notes (Signed)
   Subjective:    Patient ID: Carol Finley, female    DOB: 1950-11-14, 63 y.o.   MRN: [id number]  HPI Carol Finley is here for followup diabetes, blood pressure, cholesterol.  Diabetes Compliant with medications: yes - takes metformin 1000 mg twice daily and insulin NPH 15 units twice daily Side effects from medications: no Check sugars at home: yes  Sugar ranges: mostly in 120s and 130s (all fasting numbers)  Polyuria: no Polydipsia: no Vision changes: no Hypoglycemic symptoms: no  Eye exam: 06/2013, she is wearing glasses now, which she states greatly improved her vision. Microalbumin: n/a on ace-i  Hypertension Compliant with medication: yes Side effects from medication: no Check BP at home: no  Chest pain: no Palpitations: no Vision changes: no Leg edema: no Dizziness: yes - did have one episode of feeling dizzy last week. This is when she ate an early breakfast, and then did not eat anything for a number of hours. It resolved with drinking some water and eating a snack.  Hyperlipidemia Compliant with medications: Yes - on zocor 40mg  daily Side effects from medications: No   Current Outpatient Prescriptions on File Prior to Visit  Medication Sig Dispense Refill  . aspirin 81 MG tablet Take 1 tablet (81 mg total) by mouth daily.  30 tablet  5  . cloNIDine (CATAPRES) 0.3 MG tablet Take 1 tablet (0.3 mg total) by mouth 2 (two) times daily.  60 tablet  11  . furosemide (LASIX) 20 MG tablet Take 1 tablet (20 mg total) by mouth daily.  30 tablet  11  . gabapentin (NEURONTIN) 300 MG capsule Take 1 capsule (300 mg total) by mouth 2 (two) times daily.  60 capsule  11  . insulin NPH Human (HUMULIN N) 100 UNIT/ML injection Inject 0.15 mLs (15 Units total) into the skin 2 (two) times daily before a meal.  1 vial  12  . lisinopril (PRINIVIL,ZESTRIL) 40 MG tablet Take 1 tablet (40 mg total) by mouth daily.  30 tablet  11  . metFORMIN (GLUCOPHAGE) 1000  MG tablet Take 1 tablet (1,000 mg total) by mouth 2 (two) times daily with a meal.  60 tablet  5  . desloratadine (CLARINEX) 5 MG tablet Take 1 tablet (5 mg total) by mouth daily.  30 tablet  2  . glucose blood test strip Use as instructed  100 each  12  . naproxen (NAPROSYN) 500 MG tablet Take 1 tablet (500 mg total) by mouth 2 (two) times daily.  60 tablet  0  . triamcinolone cream (KENALOG) 0.1 % Apply 1 application topically 2 (two) times daily.  30 g  0   No current facility-administered medications on file prior to visit.    I have reviewed and updated the following as appropriate: allergies and current medications SHx: never smoker  Review of Systems See HPI    Objective:   Physical Exam BP 127/73  Pulse 85  Temp(Src) 98.1 F (36.7 C) (Oral)  Ht 5' 1.5" (1.562 m)  Wt 161 lb (73.029 kg)  BMI 29.93 kg/m2 Gen: alert, cooperative, NAD HEENT: AT/[place], sclera white, MMM Neck: supple CV: RRR, no murmurs Pulm: CTAB, no wheezes or rales Ext: no edema, 2+ DP pulses bilaterally     Assessment & Plan:

## 2013-09-25 NOTE — Assessment & Plan Note (Signed)
Well-controlled. Continue lisinopril, HCTZ, nifedipine, clonidine. Followup in 3 months.

## 2013-09-25 NOTE — Assessment & Plan Note (Addendum)
A1c 7.9%. She will check two-hour postprandial blood sugars. If they are greater than 200 she will increase her NPH to 17 units twice a day. Continue metformin 1000 mg twice daily. Continue simvastatin 40 mg daily. Will check a lipid panel today. Followup in 3 months.

## 2013-09-25 NOTE — Patient Instructions (Signed)
Fue Psychiatrist conocerte!  Su presin arterial es excelente.  Por favor verifique su nivel de azcar en la sangre 2 horas despus de comer. Si se trata de ms de 200, aumentar su insulina para 17 unidades de [employer]. Si es menos de 200, siga usando 15 [employer] al [employer].  Dar seguimiento a los 3 meses.  It was nice to meet you!  Your blood pressure is excellent.  Please check your blood sugar 2 hours after eating. If it is over 200, increase your insulin to 17 units twice a day. If it is less than 200, keep using 15 units twice a day.  Follow up in 3 months.

## 2013-10-29 ENCOUNTER — Ambulatory Visit: Payer: Self-pay

## 2013-11-12 ENCOUNTER — Ambulatory Visit: Payer: No Typology Code available for payment source

## 2013-11-22 ENCOUNTER — Other Ambulatory Visit: Payer: Self-pay | Admitting: *Deleted

## 2013-11-22 DIAGNOSIS — E1140 Type 2 diabetes mellitus with diabetic neuropathy, unspecified: Secondary | ICD-10-CM

## 2013-11-22 DIAGNOSIS — I10 Essential (primary) hypertension: Secondary | ICD-10-CM

## 2013-12-04 MED ORDER — SIMVASTATIN 40 MG PO TABS: 40.0000 mg | ORAL_TABLET | Freq: Every day | ORAL | Status: DC

## 2013-12-04 MED ORDER — HYDROCHLOROTHIAZIDE 25 MG PO TABS: 50.0000 mg | ORAL_TABLET | Freq: Every day | ORAL | Status: DC

## 2013-12-05 ENCOUNTER — Telehealth: Payer: Self-pay | Admitting: Family Medicine

## 2013-12-05 NOTE — Telephone Encounter (Signed)
Pt scheduled follow-up appt on 12/24/13 but says is running out of a couple of medications.  Pt is currently at work and does not know the names of the needed scripts, please f/u with pt, preferably in the am.

## 2013-12-06 NOTE — Telephone Encounter (Signed)
Using language line; pt informed that simvastatin (ZOCOR) 40 MG tablet and hydrochlorothiazide (HYDRODIURIL) 25 MG tablet were called into the [facility] pharmacy. Order given to Olegario Messier, Pharmacist.  Clovis Pu, RN

## 2013-12-18 ENCOUNTER — Other Ambulatory Visit: Payer: Self-pay | Admitting: Family Medicine

## 2013-12-18 ENCOUNTER — Other Ambulatory Visit: Payer: Self-pay | Admitting: *Deleted

## 2013-12-18 MED ORDER — METFORMIN HCL 1000 MG PO TABS: 1000.0000 mg | ORAL_TABLET | Freq: Two times a day (BID) | ORAL | Status: DC

## 2013-12-18 MED ORDER — GABAPENTIN 300 MG PO CAPS: 300.0000 mg | ORAL_CAPSULE | Freq: Two times a day (BID) | ORAL | Status: DC

## 2013-12-18 NOTE — Telephone Encounter (Signed)
Pt left message requesting refill for metFORMIN (GLUCOPHAGE) 1000 MG tablet and gabapentin (NEURONTIN) 300 MG capsule.

## 2013-12-21 ENCOUNTER — Other Ambulatory Visit: Payer: Self-pay | Admitting: Family Medicine

## 2013-12-21 ENCOUNTER — Other Ambulatory Visit: Payer: Self-pay | Admitting: *Deleted

## 2013-12-21 MED ORDER — GABAPENTIN 300 MG PO CAPS: 300.0000 mg | ORAL_CAPSULE | Freq: Two times a day (BID) | ORAL | Status: DC

## 2013-12-21 MED ORDER — METFORMIN HCL 1000 MG PO TABS: 1000.0000 mg | ORAL_TABLET | Freq: Two times a day (BID) | ORAL | Status: DC

## 2013-12-23 MED ORDER — GABAPENTIN 300 MG PO CAPS: 300.0000 mg | ORAL_CAPSULE | Freq: Two times a day (BID) | ORAL | Status: DC

## 2013-12-24 ENCOUNTER — Telehealth: Payer: Self-pay | Admitting: Family Medicine

## 2013-12-24 ENCOUNTER — Ambulatory Visit (INDEPENDENT_AMBULATORY_CARE_PROVIDER_SITE_OTHER): Payer: No Typology Code available for payment source | Admitting: Family Medicine

## 2013-12-24 ENCOUNTER — Encounter: Payer: Self-pay | Admitting: Family Medicine

## 2013-12-24 VITALS — BP 139/78 | HR 93 | Temp 98.0°F | Wt 160.2 lb

## 2013-12-24 DIAGNOSIS — Z23 Encounter for immunization: Secondary | ICD-10-CM

## 2013-12-24 DIAGNOSIS — I10 Essential (primary) hypertension: Secondary | ICD-10-CM

## 2013-12-24 DIAGNOSIS — IMO0001 Reserved for inherently not codable concepts without codable children: Secondary | ICD-10-CM

## 2013-12-24 DIAGNOSIS — IMO0002 Reserved for concepts with insufficient information to code with codable children: Secondary | ICD-10-CM

## 2013-12-24 DIAGNOSIS — E1165 Type 2 diabetes mellitus with hyperglycemia: Secondary | ICD-10-CM

## 2013-12-24 LAB — POCT GLYCOSYLATED HEMOGLOBIN (HGB A1C): HEMOGLOBIN A1C: 8.4

## 2013-12-24 MED ORDER — FUROSEMIDE 20 MG PO TABS: 20.0000 mg | ORAL_TABLET | Freq: Every day | ORAL | Status: DC

## 2013-12-24 MED ORDER — LISINOPRIL 40 MG PO TABS: 40.0000 mg | ORAL_TABLET | Freq: Every day | ORAL | Status: DC

## 2013-12-24 MED ORDER — CLONIDINE HCL 0.3 MG PO TABS: 0.3000 mg | ORAL_TABLET | Freq: Two times a day (BID) | ORAL | Status: DC

## 2013-12-24 MED ORDER — GABAPENTIN 300 MG PO CAPS: 300.0000 mg | ORAL_CAPSULE | Freq: Two times a day (BID) | ORAL | Status: DC

## 2013-12-24 NOTE — Assessment & Plan Note (Signed)
Well-controlled.  Continue current regimen. 

## 2013-12-24 NOTE — Assessment & Plan Note (Signed)
Will check A1c today on the way out and call her with any necessary medication adjustments (A1c not drawn before appt today).  Cardiac: on aspirin & statin Renal: on ACE Eye: reportedly done 5-6 mos ago per patient Foot: due December 2015

## 2013-12-24 NOTE — Progress Notes (Signed)
Patient ID: Carol Finley, female   DOB: [birth year], 63 y.o.   MRN: [id number]  Spanish interpreter utilized during this visit.   HPI:  DM:  Checks sugar every morning and gets 130s-140s fasting.  After eating always gets less than 200. Takes insulin NPH 15 units BID before meals. Also takes metformin 1000mg  BID. Denies any problems with dizziness or lightheadedness.   HTN: taking clonidine 0.3mg  BID, lasix 20mg  daily, HCTZ 50mg  daily, lisinopril 40mg  daily. NOT taking nifedipine. Denies CP or SOB. No lower ext edema.  ROS: See HPI.  PMFSH: hx HLD, HTN, T2DM  PHYSICAL EXAM: BP 139/78  Pulse 93  Temp(Src) 98 F (36.7 C) (Oral)  Wt 160 lb 3.2 oz (72.666 kg) Gen: NAD HEENT: NCAT Heart: RRR, no murmurs Lungs: CTAB, NWOB Neuro: grossly nonfocal, speech normal Ext: No appreciable lower extremity edema bilaterally   ASSESSMENT/PLAN:  Health maintenance:  -flu shot given today  -advised pt to schedule separate health maintenance visit appt  Essential hypertension, benign Well controlled. Continue current regimen.   Diabetes type 2, uncontrolled Will check A1c today on the way out and call her with any necessary medication adjustments (A1c not drawn before appt today).  Cardiac: on aspirin & statin Renal: on ACE Eye: reportedly done 5-6 mos ago per patient Foot: due December 2015    FOLLOW UP: F/u in 3 mos for HTN & DM, sooner pending A1c results  [place] J. Pollie Meyer, MD [facility]

## 2013-12-24 NOTE — Patient Instructions (Signed)
It was nice to see you today!  Blood pressure is great. Continue current medicines.  Diabetes - we are checking A1c. We will call you if we need to make any changes.  Follow up in 3 months.  Be well, Dr. Pollie Meyer

## 2013-12-24 NOTE — Telephone Encounter (Signed)
Marines, Can you call this patient and let her know that her A1c was a little higher than is ideal (it was 8.4), and I recommend she increase her NPH to 17 units twice a day? She should continue checking her blood sugars each morning and after eating, and any time she feels shaky/dizzy/sweaty. She should call us with any low sugars. She can follow up with Dr. Richarda Blade in 3 months, or sooner if she gets any low sugars or sugars >200. Thanks! Latrelle Dodrill, MD

## 2013-12-25 ENCOUNTER — Other Ambulatory Visit: Payer: Self-pay | Admitting: *Deleted

## 2013-12-25 DIAGNOSIS — I10 Essential (primary) hypertension: Secondary | ICD-10-CM

## 2013-12-25 MED ORDER — NIFEDIPINE ER OSMOTIC RELEASE 60 MG PO TB24: 60.0000 mg | ORAL_TABLET | Freq: Every day | ORAL | Status: DC

## 2013-12-27 NOTE — Telephone Encounter (Signed)
Red team - wow, I don't know what I was thinking when I routed this to [employer] since she doesn't work here anymore. Can you call this patient with the message below? You might have to use the Spanish line. Thanks! Latrelle Dodrill, MD

## 2013-12-27 NOTE — Telephone Encounter (Signed)
Red team, never mind. I just found out that Barbaraann Boys is doing our Spanish line calls. Clydie Braun, would you mind calling? Thanks! Latrelle Dodrill, MD

## 2014-01-07 ENCOUNTER — Ambulatory Visit (INDEPENDENT_AMBULATORY_CARE_PROVIDER_SITE_OTHER): Payer: No Typology Code available for payment source | Admitting: Family Medicine

## 2014-01-07 ENCOUNTER — Encounter: Payer: Self-pay | Admitting: Family Medicine

## 2014-01-07 VITALS — BP 148/81 | HR 76 | Temp 98.6°F | Wt 160.0 lb

## 2014-01-07 DIAGNOSIS — E1165 Type 2 diabetes mellitus with hyperglycemia: Secondary | ICD-10-CM

## 2014-01-07 DIAGNOSIS — IMO0002 Reserved for concepts with insufficient information to code with codable children: Secondary | ICD-10-CM

## 2014-01-07 DIAGNOSIS — I10 Essential (primary) hypertension: Secondary | ICD-10-CM

## 2014-01-07 MED ORDER — INSULIN NPH (HUMAN) (ISOPHANE) 100 UNIT/ML ~~LOC~~ SUSP: 17.0000 [IU] | Freq: Two times a day (BID) | SUBCUTANEOUS | Status: DC

## 2014-01-07 MED ORDER — LISINOPRIL 40 MG PO TABS: 40.0000 mg | ORAL_TABLET | Freq: Every day | ORAL | Status: DC

## 2014-01-07 MED ORDER — FUROSEMIDE 20 MG PO TABS: 20.0000 mg | ORAL_TABLET | Freq: Every day | ORAL | Status: DC

## 2014-01-07 MED ORDER — NIFEDIPINE ER OSMOTIC RELEASE 60 MG PO TB24: 60.0000 mg | ORAL_TABLET | Freq: Every day | ORAL | Status: DC

## 2014-01-07 MED ORDER — CLONIDINE HCL 0.3 MG PO TABS: 0.3000 mg | ORAL_TABLET | Freq: Two times a day (BID) | ORAL | Status: DC

## 2014-01-07 NOTE — Progress Notes (Signed)
   Subjective:    Patient ID: Carol Finley, female    DOB: 1950/06/08, 63 y.o.   MRN: [id number]  HPI CHRONIC DIABETES  Disease Monitoring  Blood Sugar Ranges: 130-140 fasting  Polyuria: no   Visual problems: yes, chronic and not dm related, unchanged   Medication Compliance: yes  Medication Side Effects  Hypoglycemia: no, felt a little symptomatic at 94   Preventitive Health Care  Eye Exam: 4/14 in office, will wait to see if we have it available here again soon  Foot Exam: 03/2013  Diet pattern: increased fruits and veggies  Exercise: 20 minutes walking daily, +active job (housekeeping)  CHRONIC HYPERTENSION  Disease Monitoring  Blood pressure range: not checking at home, 120-150 here   Chest pain: no   Dyspnea: no   Claudication: no   Medication compliance: yes, except for nifedipine because pharmacy has been out for 3 weeks  Medication Side Effects  Lightheadedness: no   Urinary frequency: no   Edema: no   Impotence: no   Preventitive Healthcare:  Exercise: yes   Diet Pattern: fruits and veggies, lowfat  Salt Restriction: sometimes     Review of Systems See HPI    Objective:   Physical Exam  Nursing note and vitals reviewed. Constitutional: She is oriented to person, place, and time. She appears well-developed and well-nourished. No distress.  HENT:  Head: Normocephalic and atraumatic.  Eyes: Conjunctivae are normal. Right eye exhibits no discharge. Left eye exhibits no discharge. No scleral icterus.  Cardiovascular: Normal rate.   Pulmonary/Chest: Effort normal.  Abdominal: She exhibits no distension.  Musculoskeletal: She exhibits no edema.  Neurological: She is alert and oriented to person, place, and time.  Skin: Skin is warm and dry. No rash noted. She is not diaphoretic.  Psychiatric: She has a normal mood and affect. Her behavior is normal.          Assessment & Plan:

## 2014-01-07 NOTE — Assessment & Plan Note (Signed)
Slightly elevated today at 148/81 but off nifedipine for 3 weeks due to pharmacy stock - no changes at this time, if still elevated at next check with nifedipine on board will consider adjusting - refilled lasix, procardia, clonidine, lisinopril

## 2014-01-07 NOTE — Patient Instructions (Signed)
Vamos a cambiar su dosis de insulina a 17 dos veces al dia para controllar su diabetes mejor.  Por favor, regresa en 3 meses para ver como esta con este cambio.

## 2014-01-07 NOTE — Assessment & Plan Note (Signed)
A1c increased to 8.4 from 7.9, no hypoglycemic events - increase NPH dose to 17u BID - continue metformin - f/u in 3 months

## 2014-01-09 NOTE — Telephone Encounter (Signed)
Red team, it appears Clydie Braun has not called. Can you call pt using Spanish line and relay the message below? Thanks. Latrelle Dodrill, MD

## 2014-01-10 NOTE — Telephone Encounter (Signed)
It looks like Dr. Richarda Blade already discussed this with patient at her appointment on 10/5

## 2014-03-05 ENCOUNTER — Encounter: Payer: Self-pay | Admitting: Family Medicine

## 2014-03-05 ENCOUNTER — Ambulatory Visit (INDEPENDENT_AMBULATORY_CARE_PROVIDER_SITE_OTHER): Payer: No Typology Code available for payment source | Admitting: Family Medicine

## 2014-03-05 VITALS — BP 171/70 | HR 68 | Temp 98.1°F | Ht 61.5 in | Wt 162.0 lb

## 2014-03-05 DIAGNOSIS — R300 Dysuria: Secondary | ICD-10-CM

## 2014-03-05 LAB — POCT UA - MICROSCOPIC ONLY

## 2014-03-05 LAB — POCT URINALYSIS DIPSTICK
Bilirubin, UA: NEGATIVE
GLUCOSE UA: NEGATIVE
Ketones, UA: NEGATIVE
NITRITE UA: NEGATIVE
PH UA: 7
Protein, UA: NEGATIVE
Spec Grav, UA: <=1.005
Urobilinogen, UA: 0.2

## 2014-03-05 MED ORDER — SULFAMETHOXAZOLE-TRIMETHOPRIM 800-160 MG PO TABS: 1.0000 | ORAL_TABLET | Freq: Two times a day (BID) | ORAL | Status: DC

## 2014-03-05 NOTE — Progress Notes (Signed)
Patient ID: Carol Finley, female   DOB: 1950-09-02, 63 y.o.   MRN: [id number]  Subjective:   CC: Dysuria  HPI:   Patient presents to sameday clinic today to discuss DYSURIA.  Pain urinating started 7 days ago. Pain is: after voiding Medications tried: No Any antibiotics in the last 30 days: No More than 3 UTIs in the last 12 months: No; 1 UTI ~1 year ago STD exposure: No Possibly pregnant: No LMP 15 years ago  Symptoms Burning or pain, hard to describe, occasionally does not feel but usually does. Mild "burning" vs "cold" sensation. Urgency: No Frequency: No No change in color or smell. Blood in urine: No Pain in back:Yes, mild, like she used to feel prior to periods, bilateral low back Fever: No Vaginal discharge: No Mouth Ulcers: No Symptoms have remained unchanged.  Review of Symptoms - see HPI PMH - Smoking status noted.    Review of Systems - Per HPI.  PMH - HLD, obesity, HTN, DM type 2, allergic rhinits, decreased visual acuity Smoking status: no    Objective:  Physical Exam BP 171/70 mmHg  Pulse 68  Temp(Src) 98.1 F (36.7 C) (Oral)  Ht 5' 1.5" (1.562 m)  Wt 162 lb (73.483 kg)  BMI 30.12 kg/m2 GEN: NAD, pleasant CV: RRR, no m/r/g PULM: CTAB, normal effort ABD: S/NT/ND, NABS, no appreciable organomegaly, obese EXTR: trace pitting LE edema bilaterally to mid-shin BACK: No CVA tenderness    Assessment:     Carol Finley is a 63 y.o. female here for dysuria.    Plan:     # See problem list and after visit summary for problem-specific plans.   # Health Maintenance: Not discussed.  Follow-up: Follow up in 1 week for persistent symptoms. - F/u HTN with PCP.   Leona Singleton, MD [facility]

## 2014-03-05 NOTE — Patient Instructions (Signed)
Porque tiene sintomas, estamos colectando urina para evaluar si hay infeccion. Toma antibiotico para 3 dias, dos veces al dia. Regresa si tiene fiebre o sintomas no esta mejorando en 1 semana. Bebe mucha agua.  Buenos,  Leona Singleton, MD  [facility] [place]  (Urinary Tract Infection)  La infeccin [place] puede ocurrir en cualquier lugar del tracto urinario. El tracto urinario es un sistema de drenaje del cuerpo por el que se eliminan los desechos y el exceso de [place]. El tracto urinario est formado por dos riones, dos urteres, la vejiga y Engineer, mining. Los riones son rganos que tienen forma de frijol. Cada rin tiene aproximadamente el tamao del puo. Estn situados debajo de las [place], uno a cada lado de la columna vertebral CAUSAS  La causa de la infeccin son los microbios, que son organismos microscpicos, que incluyen hongos, virus, y bacterias. Estos organismos son tan pequeos que slo pueden verse a travs del microscopio. Las bacterias son los microorganismos que ms comnmente causan infecciones urinarias.  SNTOMAS  Los sntomas pueden variar segn la edad y el sexo del paciente y por la ubicacin de la infeccin. Los sntomas en las mujeres jvenes incluyen la necesidad frecuente e intensa de orinar y una sensacin dolorosa de ardor en la vejiga o en la uretra durante la miccin. Las mujeres y los hombres mayores podrn sentir cansancio, temblores y debilidad y Futures trader musculares y Engineer, mining abdominal. Si tiene [place], puede significar que la infeccin est en los riones. Otros sntomas son dolor en la espalda o en los lados debajo de las [place], nuseas y vmitos.  DIAGNSTICO  Para diagnosticar una infeccin urinaria, el mdico le preguntar acerca de sus sntomas. [employer] una [place] de [place]. La muestra de orina se analiza para Engineer, manufacturing bacterias y glbulos blancos de Risk manager. Los glbulos blancos se forman en el organismo para ayudar a Artist  las infecciones.  TRATAMIENTO  Por lo general, las infecciones urinarias pueden tratarse con medicamentos. Debido a que la [employer] de las infecciones son causadas por bacterias, por lo general pueden tratarse con antibiticos. La eleccin del antibitico y la duracin del tratamiento depender de sus sntomas y el tipo de bacteria causante de la infeccin.  INSTRUCCIONES PARA EL CUIDADO EN EL HOGAR   Si le recetaron antibiticos, tmelos exactamente como su mdico le indique. Termine el medicamento aunque se sienta mejor despus de haber tomado slo algunos.  Beba gran cantidad de lquido para mantener la orina de tono claro o color amarillo plido.  Evite la cafena, el t y las [address]. Estas sustancias irritan la vejiga.  Vaciar la vejiga con frecuencia. Evite retener la orina durante largos perodos.  Vace la vejiga antes y despus de Management consultant.  Despus de mover el intestino, las mujeres deben higienizarse la regin perineal desde adelante hacia atrs. Use slo un papel tissue por vez. SOLICITE ATENCIN MDICA SI:   Siente dolor en la espalda.  Le sube la fiebre.  Los sntomas no mejoran luego de [address]. SOLICITE ATENCIN MDICA DE INMEDIATO SI:   Siente dolor intenso en la espalda o en la zona inferior del abdomen.  Comienza a sentir escalofros.  Tiene nuseas o vmitos.  Tiene una sensacin continua de quemazn o molestias al [employer]. ASEGRESE DE QUE:   Comprende estas instrucciones.  Controlar su enfermedad.  Solicitar ayuda de inmediato si no mejora o empeora. Document Released: 12/30/2004 Document Revised: 12/15/2011 [facility] Patient Information 2015 [place], [place]. This information is not  intended to replace advice given to you by your health care provider. Make sure you discuss any questions you have with your health care provider.

## 2014-03-07 DIAGNOSIS — R300 Dysuria: Secondary | ICD-10-CM | POA: Insufficient documentation

## 2014-03-07 NOTE — Assessment & Plan Note (Addendum)
Afebrile with normal vitals (other than elevated BP). Dysuria symptoms for 1 week. Most likely UTI. Will collect UA but treat empirically given dysuria present 1 week and back pain. Postmenopausal and denies risk for STD with no vaginal symptoms or skin changes. No systemic symptoms. - UA - bmet collected 09/2013 was normal. - bactrim ds bid x 3 days and hydrate - Return in 1 week if symptoms not improving.

## 2014-05-16 ENCOUNTER — Ambulatory Visit (INDEPENDENT_AMBULATORY_CARE_PROVIDER_SITE_OTHER): Payer: Self-pay | Admitting: Family Medicine

## 2014-05-16 ENCOUNTER — Encounter: Payer: Self-pay | Admitting: Family Medicine

## 2014-05-16 VITALS — BP 145/69 | HR 69 | Temp 98.2°F | Ht 62.0 in | Wt 163.0 lb

## 2014-05-16 DIAGNOSIS — L989 Disorder of the skin and subcutaneous tissue, unspecified: Secondary | ICD-10-CM

## 2014-05-16 DIAGNOSIS — L219 Seborrheic dermatitis, unspecified: Secondary | ICD-10-CM

## 2014-05-16 DIAGNOSIS — L299 Pruritus, unspecified: Secondary | ICD-10-CM

## 2014-05-16 DIAGNOSIS — L210 Seborrhea capitis: Secondary | ICD-10-CM

## 2014-05-16 DIAGNOSIS — R238 Other skin changes: Secondary | ICD-10-CM

## 2014-05-16 MED ORDER — CETIRIZINE HCL 10 MG PO TABS: 10.0000 mg | ORAL_TABLET | Freq: Every day | ORAL | Status: DC

## 2014-05-16 NOTE — Progress Notes (Signed)
   Subjective:    Patient ID: Carol Finley, female    DOB: 01-13-1951, 64 y.o.   MRN: [id number]  HPI: Pt presents to clinic for itching of her scalp. Visit conducted in Spanish with in-person interpretation by Hilaria Ota [facility]). She has had itchiness of her scalp for about 1 week. She has scratched to the point of making it hurt. She does not currently have any other rash or itching. She has had all-over body itching in the past which was helped with "medicine for allergies." She is no longer taking that medication. She has taken Zyrtec in the past but it is very expensive. She lives with her daughter who has no itching or rash.  Review of Systems: As above. She denies fever / chills, N/V, abdominal pain, SOB, cough.     Objective:   Physical Exam BP 145/69 mmHg  Pulse 69  Temp(Src) 98.2 F (36.8 C) (Oral)  Ht 5\' 2"  (1.575 m)  Wt 163 lb (73.936 kg)  BMI 29.81 kg/m2 Gen: adult female in NAD HEENT: [place], AT, EOMI, PERRLA, MMM  Scalp very dry in patches with flaky skin but no frank broken skin, bleeding / drainage / ulceration  No live lice or nits noted throughout hair on head Cardio: RRR, no murmur Pulm: CTAB, no wheezes Ext: warm, well-perfused     Assessment & Plan:  64yo female with scalp irritation / dandruff, without apparent infection or other lesions - reassured pt she does not have lice - recommended generic Zyrtec for itching and new Rx given - recommended cooler water for bathing, dandruff shampoo, and / or ketoconazole-containing shampoo OTC - f/u with PCP Dr. Richarda Blade otherwise as needed  Note FYI to Dr. Haywood Pao, MD PGY-3, [facility] 05/16/2014, 8:14 PM

## 2014-05-16 NOTE — Patient Instructions (Signed)
Thank you for coming in, today!  Your scalp is very dry, which is probably causing your itching. You can take cetirizine (generic for Zyrtec) to help with the itching.  You should try using a dandruff shampoo like Head and Shoulders, Selsun Blue, and so on. When you take a shower, try to use lukewarm water (very hot water can dry your skin out even worse). You can also try shampoo with ketoconazole in it. This is a medicine than can help if there is any fungus causing infection. Your pharmacist can help you find these shampoos.  Come back to see Dr. Richarda Blade as you need. Please feel free to call with any questions or concerns at any time, at [phone]. --Dr. Casper Harrison

## 2014-05-27 ENCOUNTER — Ambulatory Visit: Payer: Self-pay

## 2014-06-11 ENCOUNTER — Ambulatory Visit (INDEPENDENT_AMBULATORY_CARE_PROVIDER_SITE_OTHER): Payer: Self-pay | Admitting: Family Medicine

## 2014-06-11 ENCOUNTER — Encounter: Payer: Self-pay | Admitting: Family Medicine

## 2014-06-11 VITALS — BP 152/71 | HR 71 | Temp 98.1°F | Wt 165.3 lb

## 2014-06-11 DIAGNOSIS — L299 Pruritus, unspecified: Secondary | ICD-10-CM

## 2014-06-11 DIAGNOSIS — L308 Other specified dermatitis: Secondary | ICD-10-CM

## 2014-06-11 MED ORDER — NAPROXEN 500 MG PO TABS: 500.0000 mg | ORAL_TABLET | Freq: Two times a day (BID) | ORAL | Status: DC | PRN

## 2014-06-11 MED ORDER — LORATADINE 10 MG PO TABS: 10.0000 mg | ORAL_TABLET | Freq: Every day | ORAL | Status: DC

## 2014-06-11 NOTE — Patient Instructions (Signed)
Thank you for coming in, today!  I think your itching is due a reaction to the cleaning product. I will prescribe a different allergy medicine called loratadine (Claritin). Take one 10 mg tablet every day. Keep using the dandruff shampoo and try using it as a body wash.  Try using a different but similar dandruff shampoo with a different "active ingredient." Here is a list of the different types. If you've tried one already, try a different one: - selenium sulfide - pyrithione zinc - salicylic acid - coal tar - ketoconazole  Come back to see Dr. Richarda Blade as you need.  Please feel free to call with any questions or concerns at any time, at [phone]. --Dr. Casper Harrison

## 2014-06-11 NOTE — Progress Notes (Signed)
   Subjective:    Patient ID: Carol Finley, female    DOB: 06/04/1950, 64 y.o.   MRN: [id number]  Visit conducted in Spanish. In-person Spanish interpretation by Maretta Los [facility]).  HPI: Pt presents to clinic for SDA for concern over allergies. She has continued itching, mostly on her scalp, but also sometimes on her arms / hands and body. She has not had much help with generic Zyrtec. Over-the-counter dandruff shampoo has helped with flaking / itching of her scalp, some but she is unsure of the name. 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 She showers daily or almost every day, which does not seem to help. She has no fevers / chills, coryza-type symptoms, SOB, cough, or sick contacts.  Review of Systems: As above.     Objective:   Physical Exam BP 152/71 mmHg  Pulse 71  Temp(Src) 98.1 F (36.7 C) (Oral)  Wt 165 lb 5 oz (74.985 kg) Gen: well-appearing adult female in NAD HEENT: [place], AT, EOMI, PERRLA  Scalp diffusely dry and slightly flaky but improved from previous exam  No frank infestation noted (no live insects, no nits / eggs / etc) Cardio: RRR, no murmur Pulm: CTAB, no wheezes, normal WOB Skin: warm, dry, intact, no frank rashes noted to extremities or trunk  No punctate lesions to suggest insect bites  No skin fold involvement to suggest infestation such as scabies     Assessment & Plan:  64yo female with pruritic dermatitis, without frank rash, possibly due to chemical exposure at work - some relief with dandruff shampoo for scalp dryness / itching, so suggested changing to different active ingredient / rotating shampoos, etc - minimal relief with Zyrtec so new Rx given for Claritin - considered brief course of prednisone but pt is diabetic, so will hold on this for now - encouraged good hygiene / daily  bathing - suggested use of dandruff shampoo as a body wash, as well - suggested speaking with employer to change away from new cleaning material - f/u with PCP Dr. Richarda Blade as needed, otherwise  Note FYI to Dr. Richarda Blade.  Bobbye Morton, MD PGY-3, [facility] 06/11/2014, 10:09 AM

## 2014-06-19 ENCOUNTER — Other Ambulatory Visit: Payer: Self-pay | Admitting: *Deleted

## 2014-06-19 DIAGNOSIS — E119 Type 2 diabetes mellitus without complications: Secondary | ICD-10-CM

## 2014-06-19 MED ORDER — METFORMIN HCL 1000 MG PO TABS: 1000.0000 mg | ORAL_TABLET | Freq: Two times a day (BID) | ORAL | Status: DC

## 2014-08-02 ENCOUNTER — Ambulatory Visit (INDEPENDENT_AMBULATORY_CARE_PROVIDER_SITE_OTHER): Payer: Self-pay | Admitting: Family Medicine

## 2014-08-02 ENCOUNTER — Encounter: Payer: Self-pay | Admitting: Family Medicine

## 2014-08-02 VITALS — BP 133/75 | HR 73 | Temp 97.8°F | Ht 61.5 in | Wt 161.5 lb

## 2014-08-02 DIAGNOSIS — IMO0002 Reserved for concepts with insufficient information to code with codable children: Secondary | ICD-10-CM

## 2014-08-02 DIAGNOSIS — I10 Essential (primary) hypertension: Secondary | ICD-10-CM

## 2014-08-02 DIAGNOSIS — E1165 Type 2 diabetes mellitus with hyperglycemia: Secondary | ICD-10-CM

## 2014-08-02 LAB — POCT GLYCOSYLATED HEMOGLOBIN (HGB A1C): HEMOGLOBIN A1C: 7.7

## 2014-08-02 NOTE — Assessment & Plan Note (Addendum)
Well controlled today. Patient's records from pharmacy indicate range of 110-170 systolic but most days 120s/70s. - continue current regimen of lasix, procardia, clonidine and lisinopril - f/u in 3 months

## 2014-08-02 NOTE — Patient Instructions (Signed)
Para controllar su diabetes mejor, [place] 20 unidades de [employer] al dia. Si se siente peor con eso, llama y puedo bajar la dosis de nueve.

## 2014-08-08 NOTE — Progress Notes (Signed)
   Subjective:    Patient ID: Carol Finley, female    DOB: 17-Jan-1951, 64 y.o.   MRN: [id number]  HPI Pt presents for f/u of htn and dm. Reports she is taking her medication as directed, including 17u NPH insulin BID. She is feeling well without hypoglycemia, lightheadedness, edema, headaches, polyurea, chest pain, SOB. She is trying to cut back on fat and salt in her diet and making some headway. She walks around the block a few days a week.   Review of Systems See HPI    Objective:   Physical Exam  Constitutional: She is oriented to person, place, and time. She appears well-developed and well-nourished. No distress.  HENT:  Head: Normocephalic and atraumatic.  Eyes: Conjunctivae are normal. Right eye exhibits no discharge. Left eye exhibits no discharge.  Cardiovascular: Normal rate, regular rhythm, normal heart sounds and intact distal pulses.   No murmur heard. Pulmonary/Chest: Effort normal and breath sounds normal. No respiratory distress. She has no wheezes.  Abdominal: She exhibits no distension.  Musculoskeletal: She exhibits no edema.  Neurological: She is alert and oriented to person, place, and time.  Skin: Skin is warm and dry. No rash noted. She is not diaphoretic.  Psychiatric: She has a normal mood and affect. Her behavior is normal.  Nursing note and vitals reviewed.         Assessment & Plan:

## 2014-08-08 NOTE — Assessment & Plan Note (Signed)
A1c improved to 7.7 - increase NPH to 20u BID - continue metformin 1g bid - f/u in 3 months

## 2014-09-06 ENCOUNTER — Other Ambulatory Visit: Payer: Self-pay | Admitting: *Deleted

## 2014-09-06 DIAGNOSIS — IMO0002 Reserved for concepts with insufficient information to code with codable children: Secondary | ICD-10-CM

## 2014-09-06 DIAGNOSIS — E1165 Type 2 diabetes mellitus with hyperglycemia: Secondary | ICD-10-CM

## 2014-09-08 MED ORDER — INSULIN NPH (HUMAN) (ISOPHANE) 100 UNIT/ML ~~LOC~~ SUSP: 17.0000 [IU] | Freq: Two times a day (BID) | SUBCUTANEOUS | Status: DC

## 2014-09-09 ENCOUNTER — Other Ambulatory Visit: Payer: Self-pay | Admitting: *Deleted

## 2014-09-09 DIAGNOSIS — E1165 Type 2 diabetes mellitus with hyperglycemia: Secondary | ICD-10-CM

## 2014-09-09 DIAGNOSIS — IMO0002 Reserved for concepts with insufficient information to code with codable children: Secondary | ICD-10-CM

## 2014-09-09 NOTE — Telephone Encounter (Signed)
Out of medications.  Clovis Pu, RN

## 2014-09-10 ENCOUNTER — Other Ambulatory Visit: Payer: Self-pay | Admitting: *Deleted

## 2014-09-10 ENCOUNTER — Telehealth: Payer: Self-pay | Admitting: Family Medicine

## 2014-09-10 DIAGNOSIS — IMO0002 Reserved for concepts with insufficient information to code with codable children: Secondary | ICD-10-CM

## 2014-09-10 DIAGNOSIS — E1165 Type 2 diabetes mellitus with hyperglycemia: Secondary | ICD-10-CM

## 2014-09-10 MED ORDER — INSULIN NPH (HUMAN) (ISOPHANE) 100 UNIT/ML ~~LOC~~ SUSP: 17.0000 [IU] | Freq: Two times a day (BID) | SUBCUTANEOUS | Status: DC

## 2014-09-10 NOTE — Telephone Encounter (Signed)
Reviewed chart, last seen [facility] for DM2 on 08/02/14, decision made to increase NPH insulin up to 20u BID. Refilled this rx for 1 year supply, NPH inject 20u BID daily before meal, disp 1 vial, refill 12, sent via fax. Forwarded to [facility] team to confirm fax.  Note covering box for PCP Dr. Lattie Haw, DO [facility], PGY-2

## 2014-09-10 NOTE — Telephone Encounter (Signed)
Covering Dr. Cherre Huger inbox starting 6/6. I had attempted to address this today on 6/7, received refill request initially 6/6 at 4:12pm. Reviewed chart, last seen [facility] for DM2 on 08/02/14, decision made to increase NPH insulin up to 20u BID. Refilled this rx for 1 year supply, NPH inject 20u BID daily before meal, disp 1 vial, refill 12, sent via fax. Forwarded to [facility] team to confirm fax.  However, I am unsure if this was received, as patient still called today after this was done and needs this medicine. I also had tried calling [facility] program unable to get pharmacist 0900 and 0930.  Spoke with [facility] nursing staff, Jone Baseman, who would re-attempt to call MAP program to call this insulin in, if any issues or other questions she can contact [facility] preceptors. Additionally, [facility] to contact patient in Spanish to notify insulin has been sent.  Saralyn Pilar, DO [facility], PGY-2

## 2014-09-10 NOTE — Telephone Encounter (Signed)
Patient requests refill for insulin NPH to be send to [facility]. Please, follow up with Patient (Spanish).

## 2014-09-10 NOTE — Telephone Encounter (Signed)
Spoke with Dr. Althea Charon.  He faxed in Rx yesterday.  Contacted Dawn @ [facility] program, they have received the fax for 20U BID and they will contact pt to pick up medication. Fleeger, Maryjo Rochester

## 2014-09-10 NOTE — Telephone Encounter (Signed)
Pt is out of medication.  Wants to know if it will be called in today. Fleeger, Carol Finley

## 2014-09-24 ENCOUNTER — Other Ambulatory Visit: Payer: Self-pay | Admitting: Family Medicine

## 2014-09-24 DIAGNOSIS — Z1231 Encounter for screening mammogram for malignant neoplasm of breast: Secondary | ICD-10-CM

## 2014-10-02 ENCOUNTER — Ambulatory Visit (HOSPITAL_COMMUNITY)
Admission: RE | Admit: 2014-10-02 | Discharge: 2014-10-02 | Disposition: A | Discharge status: Home / Self Care | Payer: No Typology Code available for payment source | Source: Ambulatory Visit | Attending: Family Medicine | Admitting: Family Medicine

## 2014-10-02 DIAGNOSIS — Z1231 Encounter for screening mammogram for malignant neoplasm of breast: Secondary | ICD-10-CM

## 2014-11-11 ENCOUNTER — Ambulatory Visit (INDEPENDENT_AMBULATORY_CARE_PROVIDER_SITE_OTHER): Payer: No Typology Code available for payment source | Admitting: Family Medicine

## 2014-11-11 ENCOUNTER — Encounter (HOSPITAL_COMMUNITY): Payer: Self-pay

## 2014-11-11 ENCOUNTER — Observation Stay (HOSPITAL_COMMUNITY): Payer: Self-pay

## 2014-11-11 ENCOUNTER — Other Ambulatory Visit: Payer: Self-pay

## 2014-11-11 ENCOUNTER — Ambulatory Visit (HOSPITAL_COMMUNITY)
Admission: RE | Admit: 2014-11-11 | Discharge: 2014-11-11 | Disposition: A | Discharge status: Home / Self Care | Payer: Self-pay | Source: Ambulatory Visit | Attending: Cardiology | Admitting: Cardiology

## 2014-11-11 ENCOUNTER — Observation Stay (HOSPITAL_COMMUNITY)
Admission: AD | Admit: 2014-11-11 | Discharge: 2014-11-12 | Disposition: A | Discharge status: Home / Self Care | Payer: Self-pay | Source: Ambulatory Visit | Attending: Family Medicine | Admitting: Family Medicine

## 2014-11-11 ENCOUNTER — Encounter: Payer: Self-pay | Admitting: Family Medicine

## 2014-11-11 VITALS — BP 115/52 | HR 65 | Temp 98.1°F | Ht 61.5 in | Wt 158.7 lb

## 2014-11-11 DIAGNOSIS — Z6831 Body mass index (BMI) 31.0-31.9, adult: Secondary | ICD-10-CM | POA: Insufficient documentation

## 2014-11-11 DIAGNOSIS — R0789 Other chest pain: Principal | ICD-10-CM | POA: Insufficient documentation

## 2014-11-11 DIAGNOSIS — E119 Type 2 diabetes mellitus without complications: Secondary | ICD-10-CM | POA: Insufficient documentation

## 2014-11-11 DIAGNOSIS — R079 Chest pain, unspecified: Secondary | ICD-10-CM

## 2014-11-11 DIAGNOSIS — I10 Essential (primary) hypertension: Secondary | ICD-10-CM | POA: Insufficient documentation

## 2014-11-11 DIAGNOSIS — E669 Obesity, unspecified: Secondary | ICD-10-CM | POA: Insufficient documentation

## 2014-11-11 LAB — COMPREHENSIVE METABOLIC PANEL
ALBUMIN: 3.9 g/dL (ref 3.5–5.0)
ALK PHOS: 98 U/L (ref 38–126)
ALT: 14 U/L (ref 14–54)
AST: 23 U/L (ref 15–41)
Anion gap: 9 (ref 5–15)
BILIRUBIN TOTAL: 0.4 mg/dL (ref 0.3–1.2)
BUN: 16 mg/dL (ref 6–20)
CHLORIDE: 98 mmol/L — AB (ref 101–111)
CO2: 32 mmol/L (ref 22–32)
Calcium: 9.6 mg/dL (ref 8.9–10.3)
Creatinine, Ser: 0.63 mg/dL (ref 0.44–1.00)
GFR calc Af Amer: >60 mL/min (ref >60)
GLUCOSE: 129 mg/dL — AB (ref 65–99)
Potassium: 3.5 mmol/L (ref 3.5–5.1)
Sodium: 139 mmol/L (ref 135–145)
TOTAL PROTEIN: 7.9 g/dL (ref 6.5–8.1)

## 2014-11-11 LAB — CBC
HCT: 39.1 % (ref 36.0–46.0)
Hemoglobin: 12.9 g/dL (ref 12.0–15.0)
MCH: 27.4 pg (ref 26.0–34.0)
MCHC: 33 g/dL (ref 30.0–36.0)
MCV: 83.2 fL (ref 78.0–100.0)
Platelets: 322 10*3/uL (ref 150–400)
RBC: 4.7 MIL/uL (ref 3.87–5.11)
RDW: 14.3 % (ref 11.5–15.5)
WBC: 6.7 10*3/uL (ref 4.0–10.5)

## 2014-11-11 LAB — TROPONIN I

## 2014-11-11 LAB — TSH: TSH: 4.402 u[IU]/mL (ref 0.350–4.500)

## 2014-11-11 NOTE — Assessment & Plan Note (Addendum)
Primarily atypical features (stabbing sensation, somewhat reproducible on exam, not worsened with exertion). EKG without signs of acute ischemia. Patient does have risk factors for CAD (HTN, DM) and heart score = 4. As pt has never had any ischemic evaluation in the past, it is prudent to admit her for risk stratification and likely stress testing. Discussed options with patient and she is amenable to admission to the hospital for ACS rule out and cardiology consultation. Inpatient team notified and temporary orders entered.

## 2014-11-11 NOTE — H&P (Signed)
Family Medicine Teaching [facility] Admission History and Physical Service Pager: [phone]  Patient name: Carol Finley Medical record number: [id number] Date of birth: March 18, 1951 Age: 64 y.o. Gender: female  Primary Care Provider: Beverely Low, MD Consultants: Cardiology Code Status: Full  Chief Complaint: Chest Pain  Assessment and Plan: Carol Finley is a 64 y.o. female presenting with chest pain. PMH is significant for T2DM, HTN, and obesity.   #Chest Pain: Primarily atypical features (stabbing sensation, not worsened with exertion,localized). EKG without signs of acute ischemia. CXR unremarkable. No h/o CAD but has risk factors (HTN, DM) and heart score = 4.  Cannot r/o ACS.  VSS. -admit to telemetry under Dr. Randolm Idol -trend troponins -risk stratification labs ordered: TSH, lipid panel, A1c -cardiology consult for possible stress testing -repeat EKG in AM  #T2DM: On insulin and metformin at home. Last A1c 7.7 (07/2014).  -A1c recheck -Continue home Humulin 17U BID -SSI -hold metformin while hospitalized  #HTN: Normotensive -continue home BP medications  FEN/GI: Carb modified diet/ SLIV Prophylaxis: SubQ Hep  Disposition: Admit to FPTS for ACS r/o and observation  History of Present Illness: Carol Finley is a 64 y.o. female presenting with chest pain.  PMH is significant for T2DM, HTN, and obesity. Patient states that chest pain started Friday. She describes it as stabbing chest pain. States that it radiates to her head and left arm. She denies SOB or chest pressure. Says pain was at its worse last night. She denies any events that made CP worse. Not worsened by exertion or movement and occurs at rest. Occurs at rest. Localizes pain to left lower chest. Currently denies pain. Says it is intermittent. It will come on for about 3 minutes and be very intense and then subside. She did not take any medications for pain. No prior hx of  chest pain or cardiac issues.   Of note, patient was a direct admit from clinic  Spanish phone interpretor used  Review Of Systems: Per HPI with the following additions: no n/v, visual changes, weakness Otherwise 12 point review of systems was performed and was unremarkable.  Patient Active Problem List   Diagnosis Date Noted  . Chest pain 11/11/2014  . Dysuria 03/07/2014  . Sciatica 06/20/2013  . Right knee pain 06/20/2013  . Decreased visual acuity 11/15/2012  . Other malaise and fatigue 09/05/2012  . Other and unspecified hyperlipidemia 08/08/2012  . Annual physical exam 05/07/2012  . ALLERGIC RHINITIS 06/19/2010  . TINEA PEDIS 05/04/2010  . OBESITY 03/23/2010  . ONYCHOMYCOSIS, BILATERAL 03/03/2009  . Diabetes type 2, uncontrolled 12/02/2008  . Essential hypertension, benign 12/02/2008   Past Medical History: Past Medical History  Diagnosis Date  . BACTERIAL PNEUMONIA 10/09/2009    Qualifier: Diagnosis of  By: Huntley Dec, Scott    . Hypertension   . Diabetes mellitus without complication    Past Surgical History: History reviewed. No pertinent past surgical history. Social History: History  Substance Use Topics  . Smoking status: Never Smoker   . Smokeless tobacco: Not on file  . Alcohol Use: No   Please also refer to relevant sections of EMR.  Family History: Family History  Problem Relation Age of Onset  . Diabetes Mother   . Hypertension Mother   . Heart disease Mother   . Diabetes Father   . Hypertension Father    Allergies and Medications: No Known Allergies No current facility-administered medications on file prior to encounter.   Current Outpatient Prescriptions on File Prior to Encounter  Medication Sig Dispense Refill  . aspirin 81 MG tablet Take 1 tablet (81 mg total) by mouth daily. 30 tablet 5  . cloNIDine (CATAPRES) 0.3 MG tablet Take 1 tablet (0.3 mg total) by mouth 2 (two) times daily. 60 tablet 11  . furosemide (LASIX) 20 MG tablet Take  1 tablet (20 mg total) by mouth daily. 30 tablet 11  . gabapentin (NEURONTIN) 300 MG capsule Take 1 capsule (300 mg total) by mouth 2 (two) times daily. 60 capsule 11  . glucose blood test strip Use as instructed 100 each 12  . hydrochlorothiazide (HYDRODIURIL) 25 MG tablet Take 2 tablets (50 mg total) by mouth daily. 30 tablet 11  . insulin NPH Human (HUMULIN N) 100 UNIT/ML injection Inject 0.17 mLs (17 Units total) into the skin 2 (two) times daily before a meal. 1 vial 12  . lisinopril (PRINIVIL,ZESTRIL) 40 MG tablet Take 1 tablet (40 mg total) by mouth daily. 30 tablet 11  . loratadine (CLARITIN) 10 MG tablet Take 1 tablet (10 mg total) by mouth daily. 30 tablet 11  . metFORMIN (GLUCOPHAGE) 1000 MG tablet Take 1 tablet (1,000 mg total) by mouth 2 (two) times daily with a meal. 60 tablet 11  . naproxen (NAPROSYN) 500 MG tablet Take 1 tablet (500 mg total) by mouth 2 (two) times daily as needed. 60 tablet 1  . NIFEdipine (PROCARDIA XL/ADALAT-CC) 60 MG 24 hr tablet Take 1 tablet (60 mg total) by mouth daily. 30 tablet 11  . simvastatin (ZOCOR) 40 MG tablet Take 1 tablet (40 mg total) by mouth at bedtime. 90 tablet 3  . sulfamethoxazole-trimethoprim (BACTRIM DS,SEPTRA DS) 800-160 MG per tablet Take 1 tablet by mouth 2 (two) times daily. Para 3 dias. 6 tablet 0  . triamcinolone cream (KENALOG) 0.1 % Apply 1 application topically 2 (two) times daily. 30 g 0    Objective: BP 137/77 mmHg  Pulse 74  Temp(Src) 98 F (36.7 C) (Oral)  Resp 16  Ht 4\' 11"  (1.499 m)  Wt 154 lb 1.6 oz (69.9 kg)  BMI 31.11 kg/m2  SpO2 100% Exam: General: alert, well-appearing, NAD, cooperative HEENT: NCAT, PERRLA, EOMI. MMM.  Chest wall: non-tender to palpation  Lungs: CTAB, normal respiratory effort, no crackles, and no wheezes.  Heart: RRR, no M/R/G.  Abdomen: Bowel sounds normal; abdomen soft and nontender.  Pulses: intact Extremities: No cyanosis, clubbing, edema Neurologic: No focal deficits, +5 strength  globally, sensation grossly intact, A&Ox3.  Skin: Intact without suspicious lesions or rashes. Warm and dry. Psych: Mood and affect are normal; no evidence of anxiety or depression.  Labs and Imaging: Results for orders placed or performed during the hospital encounter of 11/11/14 (from the past 24 hour(s))  Troponin I     Status: None   Collection Time: 11/11/14  7:25 PM  Result Value Ref Range   Troponin I <0.03 <0.031 ng/mL  TSH     Status: None   Collection Time: 11/11/14  7:25 PM  Result Value Ref Range   TSH 4.402 0.350 - 4.500 uIU/mL  Comprehensive metabolic panel     Status: Abnormal   Collection Time: 11/11/14  7:25 PM  Result Value Ref Range   Sodium 139 135 - 145 mmol/L   Potassium 3.5 3.5 - 5.1 mmol/L   Chloride 98 (L) 101 - 111 mmol/L   CO2 32 22 - 32 mmol/L   Glucose, Bld 129 (H) 65 - 99 mg/dL   BUN 16 6 - 20 mg/dL   Creatinine,  Ser 0.63 0.44 - 1.00 mg/dL   Calcium 9.6 8.9 - 16.1 mg/dL   Total Protein 7.9 6.5 - 8.1 g/dL   Albumin 3.9 3.5 - 5.0 g/dL   AST 23 15 - 41 U/L   ALT 14 14 - 54 U/L   Alkaline Phosphatase 98 38 - 126 U/L   Total Bilirubin 0.4 0.3 - 1.2 mg/dL   GFR calc non Af Amer >60 >60 mL/min   GFR calc Af Amer >60 >60 mL/min   Anion gap 9 5 - 15  CBC     Status: None   Collection Time: 11/11/14  7:25 PM  Result Value Ref Range   WBC 6.7 4.0 - 10.5 K/uL   RBC 4.70 3.87 - 5.11 MIL/uL   Hemoglobin 12.9 12.0 - 15.0 g/dL   HCT 09.6 04.5 - 40.9 %   MCV 83.2 78.0 - 100.0 fL   MCH 27.4 26.0 - 34.0 pg   MCHC 33.0 30.0 - 36.0 g/dL   RDW 81.1 91.4 - 78.2 %   Platelets 322 150 - 400 K/uL   Dg Chest 2 View  11/11/2014   CLINICAL DATA:  Chest pain today.  EXAM: CHEST  2 VIEW  COMPARISON:  CT chest 0 8 scratch the CT chest 09/22/2009. PA and lateral chest 09/15/2009.  FINDINGS: The lungs are clear. Heart size is normal. No pneumothorax or pleural effusion.  IMPRESSION: No acute disease.   Electronically Signed   By: Drusilla Kanner M.D.   On: 11/11/2014 18:59     Pincus Large, DO 11/11/2014, 6:06 PM PGY-2, [place] FPTS Intern pager: [phone], text pages welcome

## 2014-11-11 NOTE — Progress Notes (Signed)
Patient ID: Carol Finley, female   DOB: [birth year], 64 y.o.   MRN: [id number]  Spanish interpreter utilized during today's visit.   HPI:  Pt presents for a same day appointment to discuss chest pain.  Friday evening pt began to notice stabbing pain to L chest. Took naproxen and applied some rubbing alcohol, and eventually it felt better. Had it again Saturday morning, not as bad. Has intermittently occurred since Friday evening. Accompanied with mild dyspnea. No sweating or nausea. This morning it occurred again, and was accompanied by generalized fatigue and weakness, prompting her to schedule appt. This AM pain also radiated to head and left arm. Denies having any pain or SOB now. No aggravating factors. Not worse with exertion. No lower extremity edema. Every time the pain occurred she was sitting still. No prior hx of chest pain or cardiac issues. Denies having ever seen a cardiologist. History of DM & HTN. Has never smoked. No cardiac issues in the family.  ROS: See HPI  PMFSH: hx allergic rhinitis, T2DM, HTN, obesity  PHYSICAL EXAM: BP 115/52 mmHg  Pulse 65  Temp(Src) 98.1 F (36.7 C) (Oral)  Ht 5' 1.5" (1.562 m)  Wt 158 lb 11 oz (71.981 kg)  BMI 29.50 kg/m2 Gen: NAD, pleasant, cooperative HEENT: NCAT Heart: RRR no murmur Lungs: CTAB, NWOB Chest wall: anterior L chest wall mildly tender to palpation and reproduces some of the pain Neuro: grossly nonfocal, speech normal  ASSESSMENT/PLAN:  Chest pain Primarily atypical features (stabbing sensation, somewhat reproducible on exam, not worsened with exertion). EKG without signs of acute ischemia. Patient does have risk factors for CAD (HTN, DM) and heart score = 4. As pt has never had any ischemic evaluation in the past, it is prudent to admit her for risk stratification and likely stress testing. Discussed options with patient and she is amenable to admission to the hospital for ACS rule out and cardiology consultation.  Inpatient team notified and temporary orders entered.    [place] J. Pollie Meyer, MD [facility]

## 2014-11-12 ENCOUNTER — Observation Stay (HOSPITAL_COMMUNITY): Payer: No Typology Code available for payment source

## 2014-11-12 DIAGNOSIS — R079 Chest pain, unspecified: Secondary | ICD-10-CM

## 2014-11-12 LAB — BASIC METABOLIC PANEL
Anion gap: 7 (ref 5–15)
BUN: 17 mg/dL (ref 6–20)
CALCIUM: 8.8 mg/dL — AB (ref 8.9–10.3)
CHLORIDE: 100 mmol/L — AB (ref 101–111)
CO2: 29 mmol/L (ref 22–32)
CREATININE: 0.68 mg/dL (ref 0.44–1.00)
Glucose, Bld: 148 mg/dL — ABNORMAL HIGH (ref 65–99)
POTASSIUM: 3.5 mmol/L (ref 3.5–5.1)
SODIUM: 136 mmol/L (ref 135–145)

## 2014-11-12 LAB — TROPONIN I
Troponin I: <0.03 ng/mL (ref <0.031)
Troponin I: <0.03 ng/mL (ref <0.031)

## 2014-11-12 LAB — GLUCOSE, CAPILLARY
GLUCOSE-CAPILLARY: 113 mg/dL — AB (ref 65–99)
GLUCOSE-CAPILLARY: 150 mg/dL — AB (ref 65–99)
Glucose-Capillary: 139 mg/dL — ABNORMAL HIGH (ref 65–99)

## 2014-11-12 LAB — HEMOGLOBIN A1C
HEMOGLOBIN A1C: 8.5 % — AB (ref 4.8–5.6)
Mean Plasma Glucose: 197 mg/dL

## 2014-11-12 LAB — LIPID PANEL
Cholesterol: 159 mg/dL (ref 0–200)
HDL: 34 mg/dL — AB (ref >40)
LDL CALC: 83 mg/dL (ref 0–99)
Total CHOL/HDL Ratio: 4.7 RATIO
Triglycerides: 208 mg/dL — ABNORMAL HIGH (ref <150)
VLDL: 42 mg/dL — AB (ref 0–40)

## 2014-11-12 MED ORDER — GABAPENTIN 300 MG PO CAPS
300.0000 mg | ORAL_CAPSULE | Freq: Two times a day (BID) | ORAL | Status: DC
  Administered 2014-11-12: 300 mg via ORAL
  Filled 2014-11-12 (×3): qty 1

## 2014-11-12 MED ORDER — HEPARIN SODIUM (PORCINE) 5000 UNIT/ML IJ SOLN
5000.0000 [IU] | Freq: Three times a day (TID) | INTRAMUSCULAR | Status: DC
  Administered 2014-11-12 (×2): 5000 [IU] via SUBCUTANEOUS
  Filled 2014-11-12 (×4): qty 1

## 2014-11-12 MED ORDER — ASPIRIN 81 MG PO TABS: 81.0000 mg | ORAL_TABLET | Freq: Every day | ORAL | Status: DC

## 2014-11-12 MED ORDER — ONDANSETRON HCL 4 MG/2ML IJ SOLN: 4.0000 mg | Freq: Four times a day (QID) | INTRAMUSCULAR | Status: DC | PRN

## 2014-11-12 MED ORDER — SIMVASTATIN 40 MG PO TABS
40.0000 mg | ORAL_TABLET | Freq: Every day | ORAL | Status: DC
  Filled 2014-11-12 (×2): qty 1

## 2014-11-12 MED ORDER — LORATADINE 10 MG PO TABS
10.0000 mg | ORAL_TABLET | Freq: Every day | ORAL | Status: DC
  Administered 2014-11-12: 10 mg via ORAL
  Filled 2014-11-12: qty 1

## 2014-11-12 MED ORDER — INSULIN ASPART 100 UNIT/ML ~~LOC~~ SOLN
0.0000 [IU] | Freq: Three times a day (TID) | SUBCUTANEOUS | Status: DC
  Administered 2014-11-12 (×2): 2 [IU] via SUBCUTANEOUS

## 2014-11-12 MED ORDER — CLONIDINE HCL 0.3 MG PO TABS
0.3000 mg | ORAL_TABLET | Freq: Two times a day (BID) | ORAL | Status: DC
  Administered 2014-11-12: 0.3 mg via ORAL
  Filled 2014-11-12 (×3): qty 1

## 2014-11-12 MED ORDER — ACETAMINOPHEN 325 MG PO TABS: 650.0000 mg | ORAL_TABLET | ORAL | Status: DC | PRN

## 2014-11-12 MED ORDER — FUROSEMIDE 20 MG PO TABS
20.0000 mg | ORAL_TABLET | Freq: Every day | ORAL | Status: DC
  Administered 2014-11-12: 20 mg via ORAL
  Filled 2014-11-12: qty 1

## 2014-11-12 MED ORDER — LISINOPRIL 40 MG PO TABS
40.0000 mg | ORAL_TABLET | Freq: Every day | ORAL | Status: DC
  Administered 2014-11-12: 40 mg via ORAL
  Filled 2014-11-12: qty 1

## 2014-11-12 MED ORDER — NIFEDIPINE ER 60 MG PO TB24
60.0000 mg | ORAL_TABLET | Freq: Every day | ORAL | Status: DC
  Administered 2014-11-12: 60 mg via ORAL
  Filled 2014-11-12: qty 1

## 2014-11-12 MED ORDER — ASPIRIN EC 81 MG PO TBEC
81.0000 mg | DELAYED_RELEASE_TABLET | Freq: Every day | ORAL | Status: DC
  Administered 2014-11-12: 81 mg via ORAL
  Filled 2014-11-12 (×2): qty 1

## 2014-11-12 MED ORDER — HYDROCHLOROTHIAZIDE 50 MG PO TABS
50.0000 mg | ORAL_TABLET | Freq: Every day | ORAL | Status: DC
  Filled 2014-11-12: qty 1

## 2014-11-12 MED ORDER — INSULIN NPH (HUMAN) (ISOPHANE) 100 UNIT/ML ~~LOC~~ SUSP
17.0000 [IU] | Freq: Two times a day (BID) | SUBCUTANEOUS | Status: DC
  Administered 2014-11-12 (×2): 17 [IU] via SUBCUTANEOUS
  Filled 2014-11-12: qty 10

## 2014-11-12 NOTE — Progress Notes (Signed)
*  PRELIMINARY RESULTS* Echocardiogram 2D Echocardiogram has been performed.  Carol Finley 11/12/2014, 3:34 PM

## 2014-11-12 NOTE — Discharge Instructions (Signed)
° °  Please follow up with Family Medicine as well as cardiology  Follow-up Information    Follow up with Jamelle Haring, MD On 11/19/2014.   Specialty:  Family Medicine   Why:  2:30   Contact information:   [address] [place] [address] [phone]

## 2014-11-12 NOTE — Progress Notes (Signed)
Pt cleared for discharge per MD team. Discharge instructions reviewed by MD with interpreter via phone, all questions answered. Discharge paperwork given with appointment times-reviewed with family. IV d'cd, tele off-CCMD notified. Pt left with all belongings in w/c with family.

## 2014-11-12 NOTE — Consult Note (Signed)
Admit date: 11/11/2014 Referring Physician  Dr. Gwendolyn Grant Primary Physician  Dr. Beverely Low Primary Cardiologist  None Reason for Consultation  Chest pain  HPI: This is a 64yo female with a history of T2DM, HTN and obesity who presented from clinic with complaints of chest pain.  Patient states that chest pain started Friday. She describes it as stabbing chest pain. States that it radiates to her head and left arm. She denies SOB or chest pressure.  She denies any events that made CP worse. Not worsened by exertion or movement and occurs at rest. Localizes pain to left lower chest. Currently denies pain. Says it is intermittent. It will come on for about 3 minutes and be very intense and then subside. She did not take any medications for pain. No prior hx of chest pain or cardiac issues. Cardiac enzymes negative and EKG nonischemic.  Cardiology is now asked to consult for further evaluation.      PMH:   Past Medical History  Diagnosis Date  . BACTERIAL PNEUMONIA 10/09/2009    Qualifier: Diagnosis of  By: Huntley Dec, Scott    . Hypertension   . Diabetes mellitus without complication      PSH:  History reviewed. No pertinent past surgical history.  Allergies:  Review of patient's allergies indicates no known allergies. Prior to Admit Meds:   Prescriptions prior to admission  Medication Sig Dispense Refill Last Dose  . aspirin 81 MG tablet Take 1 tablet (81 mg total) by mouth daily. 30 tablet 5 Taking  . cloNIDine (CATAPRES) 0.3 MG tablet Take 1 tablet (0.3 mg total) by mouth 2 (two) times daily. 60 tablet 11 Taking  . furosemide (LASIX) 20 MG tablet Take 1 tablet (20 mg total) by mouth daily. 30 tablet 11 Taking  . gabapentin (NEURONTIN) 300 MG capsule Take 1 capsule (300 mg total) by mouth 2 (two) times daily. 60 capsule 11 Taking  . glucose blood test strip Use as instructed 100 each 12 Taking  . hydrochlorothiazide (HYDRODIURIL) 25 MG tablet Take 2 tablets (50 mg total) by mouth daily.  30 tablet 11 Taking  . insulin NPH Human (HUMULIN N) 100 UNIT/ML injection Inject 0.17 mLs (17 Units total) into the skin 2 (two) times daily before a meal. 1 vial 12   . lisinopril (PRINIVIL,ZESTRIL) 40 MG tablet Take 1 tablet (40 mg total) by mouth daily. 30 tablet 11 Taking  . loratadine (CLARITIN) 10 MG tablet Take 1 tablet (10 mg total) by mouth daily. 30 tablet 11 Taking  . metFORMIN (GLUCOPHAGE) 1000 MG tablet Take 1 tablet (1,000 mg total) by mouth 2 (two) times daily with a meal. 60 tablet 11 Taking  . naproxen (NAPROSYN) 500 MG tablet Take 1 tablet (500 mg total) by mouth 2 (two) times daily as needed. 60 tablet 1 Taking  . NIFEdipine (PROCARDIA XL/ADALAT-CC) 60 MG 24 hr tablet Take 1 tablet (60 mg total) by mouth daily. 30 tablet 11 Taking  . simvastatin (ZOCOR) 40 MG tablet Take 1 tablet (40 mg total) by mouth at bedtime. 90 tablet 3 Taking  . sulfamethoxazole-trimethoprim (BACTRIM DS,SEPTRA DS) 800-160 MG per tablet Take 1 tablet by mouth 2 (two) times daily. Para 3 dias. 6 tablet 0 Taking  . triamcinolone cream (KENALOG) 0.1 % Apply 1 application topically 2 (two) times daily. 30 g 0 Taking   Fam HX:    Family History  Problem Relation Age of Onset  . Diabetes Mother   . Hypertension Mother   .  Heart disease Mother   . Diabetes Father   . Hypertension Father    Social HX:    History   Social History  . Marital Status: Widowed    Spouse Name: N/A  . Number of Children: N/A  . Years of Education: N/A   Occupational History  . Not on file.   Social History Main Topics  . Smoking status: Never Smoker   . Smokeless tobacco: Not on file  . Alcohol Use: No  . Drug Use: No  . Sexual Activity: No   Other Topics Concern  . Not on file   Social History Narrative     ROS:  All 11 ROS were addressed and are negative except what is stated in the HPI  Physical Exam: Blood pressure 116/53, pulse 59, temperature 98.1 F (36.7 C), temperature source Oral, resp. rate 18,  height  (1.499 m), weight 154 lb 1.6 oz (69.9 kg), SpO2 94 %.    General: Well developed, well nourished, in no acute distress Head: Eyes PERRLA, No xanthomas.   Normal cephalic and atramatic  Lungs:   Clear bilaterally to auscultation and percussion. Heart:   HRRR S1 S2 Pulses are 2+ & equal.            No carotid bruit. No JVD.  No abdominal bruits. No femoral bruits. Abdomen: Bowel sounds are positive, abdomen soft and non-tender without masses  Extremities:   No clubbing, cyanosis or edema.  DP +1 Neuro: Alert and oriented X 3. Psych:  Good affect, responds appropriately    Labs:   Lab Results  Component Value Date   WBC 6.7 11/11/2014   HGB 12.9 11/11/2014   HCT 39.1 11/11/2014   MCV 83.2 11/11/2014   PLT 322 11/11/2014    Recent Labs Lab 11/11/14 1925 11/12/14 0558  NA 139 136  K 3.5 3.5  CL 98* 100*  CO2 32 29  BUN 16 17  CREATININE 0.63 0.68  CALCIUM 9.6 8.8*  PROT 7.9  --   BILITOT 0.4  --   ALKPHOS 98  --   ALT 14  --   AST 23  --   GLUCOSE 129* 148*   No results found for: PTT No results found for: INR, PROTIME Lab Results  Component Value Date   TROPONINI <0.03 11/12/2014     Lab Results  Component Value Date   CHOL 159 11/12/2014   CHOL 137 09/25/2013   CHOL 172 05/05/2012   Lab Results  Component Value Date   HDL 34* 11/12/2014   HDL 44 09/25/2013   HDL 48 05/05/2012   Lab Results  Component Value Date   LDLCALC 83 11/12/2014   LDLCALC 47 09/25/2013   LDLCALC 93 05/05/2012   Lab Results  Component Value Date   TRIG 208* 11/12/2014   TRIG 228* 09/25/2013   TRIG 155* 05/05/2012   Lab Results  Component Value Date   CHOLHDL 4.7 11/12/2014   CHOLHDL 3.1 09/25/2013   CHOLHDL 3.6 05/05/2012   No results found for: LDLDIRECT    Radiology:  Dg Chest 2 View  11/11/2014   CLINICAL DATA:  Chest pain today.  EXAM: CHEST  2 VIEW  COMPARISON:  CT chest 0 8 scratch the CT chest 09/22/2009. PA and lateral chest 09/15/2009.   FINDINGS: The lungs are clear. Heart size is normal. No pneumothorax or pleural effusion.  IMPRESSION: No acute disease.   Electronically Signed   By: Drusilla Kanner M.D.   On:  11/11/2014 18:59    EKG:  NSR with LVH by voltage  ASSESSMENT/PLAN: 1.  Atypical chest pain in a patient with several risk factors for CAD including T2DM, HTN, dyslipidemia, postmenopausal state and family history of CAD.  Her EKG is nonischemic and Troponin negative x 3.  Would check a 2D echo and if LVF normal then ok to pursue outpatient stress testing. 2.  T2DM - per PCP 3.  HTN with LVH on EKG.  BP controlled on Clonidine, ACE I and Procardia 4.  Dyslipidemia with LDL at goal but elevated triglycerides and low HDL.  Quintella Reichert, MD  11/12/2014  10:59 AM

## 2014-11-12 NOTE — Discharge Summary (Signed)
Family Medicine Teaching [facility] Discharge Summary  Patient name: Carol Finley Medical record number: [id number] Date of birth: [birth year] Age: 64 y.o. Gender: female Date of Admission: 11/11/2014  Date of Discharge: 11/12/2014  Admitting Physician: Tobey Grim, MD  Primary Care Provider: Beverely Low, MD Consultants: Cardiology  Indication for Hospitalization:  Atypical chest pain  Discharge Diagnoses/Problem List:  Patient Active Problem List   Diagnosis Date Noted  . Chest pain 11/11/2014  . Dysuria 03/07/2014  . Sciatica 06/20/2013  . Right knee pain 06/20/2013  . Decreased visual acuity 11/15/2012  . Other malaise and fatigue 09/05/2012  . Other and unspecified hyperlipidemia 08/08/2012  . Annual physical exam 05/07/2012  . ALLERGIC RHINITIS 06/19/2010  . TINEA PEDIS 05/04/2010  . OBESITY 03/23/2010  . ONYCHOMYCOSIS, BILATERAL 03/03/2009  . Diabetes type 2, uncontrolled 12/02/2008  . Essential hypertension, benign 12/02/2008    Disposition: Home  Discharge Condition: Stable  Discharge Exam:  Temp: [98 F (36.7 C)-98.1 F (36.7 C)] 98.1 F (36.7 C) (08/09 0504) Pulse Rate: [59-74] 59 (08/09 0504) Resp: [16-18] 18 (08/09 0504) BP: (115-137)/(52-77) 116/53 mmHg (08/09 0504) SpO2: [94 %-100 %] 94 % (08/09 0504) Weight: [154 lb 1.6 oz (69.9 kg)-158 lb 11 oz (71.981 kg)] 154 lb 1.6 oz (69.9 kg) (08/08 1801) Physical Exam: General: alert, NAD, well appearing HEENT: NCAT, MMM.  Chest wall: non-tender to palpation  Lungs: CTAB, normal respiratory effort Heart: RRR, no M/R/G.  Abdomen: Bowel sounds normal; abdomen soft and nontender.  Extremities: WWP, no LE edema  Brief Hospital Course:  64 y/o with h/o T2DM, HTN presented from clinic for atypical chest pain for rule out ACS.  On admission she had a negative chest xray and ekg x2 which were negative for evidence of ischemia. Her troponins were negative x3 Cardiology was consulted  and the decision was made to proceed with inpatient echo which showed grade 1 diastolic dysfunction with EF 55-60%. Her chest pain resolved by day of discharge. She was then discharged to proceed with outpatient stress testing Additionally she was noted to be on multiple anti hypertensive agent including HCTZ as well as lasix. HCTZ was stopped in the hospital to assess tolerance of weaning antihypertensives. Will plan to continue to wean as tolerated as an outpatient  Issues for Follow Up:  1. Blood pressure regimen 2. Please follow up with patient to determine if an appointment with cardiology has been made for stress test.   Significant Procedures:  Echocardiogram   Significant Labs and Imaging:   Recent Labs Lab 11/11/14 1925  WBC 6.7  HGB 12.9  HCT 39.1  PLT 322    Recent Labs Lab 11/11/14 1925 11/12/14 0558  NA 139 136  K 3.5 3.5  CL 98* 100*  CO2 32 29  GLUCOSE 129* 148*  BUN 16 17  CREATININE 0.63 0.68  CALCIUM 9.6 8.8*  ALKPHOS 98  --   AST 23  --   ALT 14  --   ALBUMIN 3.9  --      Results/Tests Pending at Time of Discharge:  None  Discharge Medications:    Medication List    STOP taking these medications        hydrochlorothiazide 25 MG tablet  Commonly known as:  HYDRODIURIL      TAKE these medications        aspirin 81 MG tablet  Take 1 tablet (81 mg total) by mouth daily.     cloNIDine 0.3 MG tablet  Commonly known as:  CATAPRES  Take 1 tablet (0.3 mg total) by mouth 2 (two) times daily.     furosemide 20 MG tablet  Commonly known as:  LASIX  Take 1 tablet (20 mg total) by mouth daily.     gabapentin 300 MG capsule  Commonly known as:  NEURONTIN  Take 1 capsule (300 mg total) by mouth 2 (two) times daily.     glucose blood test strip  Use as instructed     insulin NPH Human 100 UNIT/ML injection  Commonly known as:  HUMULIN N  Inject 0.17 mLs (17 Units total) into the skin 2 (two) times daily before a meal.     lisinopril 40  MG tablet  Commonly known as:  PRINIVIL,ZESTRIL  Take 1 tablet (40 mg total) by mouth daily.     loratadine 10 MG tablet  Commonly known as:  CLARITIN  Take 1 tablet (10 mg total) by mouth daily.     metFORMIN 1000 MG tablet  Commonly known as:  GLUCOPHAGE  Take 1 tablet (1,000 mg total) by mouth 2 (two) times daily with a meal.     naproxen 500 MG tablet  Commonly known as:  NAPROSYN  Take 1 tablet (500 mg total) by mouth 2 (two) times daily as needed.     NIFEdipine 60 MG 24 hr tablet  Commonly known as:  PROCARDIA XL/ADALAT-CC  Take 1 tablet (60 mg total) by mouth daily.     simvastatin 40 MG tablet  Commonly known as:  ZOCOR  Take 1 tablet (40 mg total) by mouth at bedtime.     sulfamethoxazole-trimethoprim 800-160 MG per tablet  Commonly known as:  BACTRIM DS,SEPTRA DS  Take 1 tablet by mouth 2 (two) times daily. Para 3 dias.     triamcinolone cream 0.1 %  Commonly known as:  KENALOG  Apply 1 application topically 2 (two) times daily.        Discharge Instructions: Please refer to Patient Instructions section of EMR for full details.  Patient was counseled important signs and symptoms that should prompt return to medical care, changes in medications, dietary instructions, activity restrictions, and follow up appointments.   Follow-Up Appointments: Follow-up Information    Follow up with Jamelle Haring, MD On 11/19/2014.   Specialty:  Family Medicine   Why:  2:30   Contact information:   [address] [place] [address] [phone]       Bonney Aid, MD 11/12/2014, 6:20 PM PGY-2, [facility]

## 2014-11-12 NOTE — Progress Notes (Signed)
Family Medicine Teaching Service Daily Progress Note Intern Pager: [phone]  Patient name: Carol Finley Medical record number: [id number] Date of birth: 1951-04-04 Age: 64 y.o. Gender: female  Primary Care Provider: Beverely Low, MD Consultants: Cardiology Code Status: Full  Pt Overview and Major Events to Date:  8/8: Admitted from clinic for atypical chest pain  Assessment and Plan: Carol Finley is a 64 y.o. female presenting with chest pain. PMH is significant for T2DM, HTN, and obesity.   #Chest Pain: Primarily atypical features in clinic, now resolved -troponins negative x3 -A1c 8.5, TSH wnl, elevated triglycerides 208, low HDL -cardiology consult this AM 8/9 for possible stress testing -f/u repeat EKG in AM 8/9 - simvastatin  #T2DM: On insulin and metformin at home. A1c 8.5 < 7.7 ( 07/2014)   -Continue home Humulin 17U BID -SSI -hold metformin while hospitalized - CBG ACH  #HTN: Normotensive -On Lasix, HCTZ at home, will d/c HCTZ - continue home clonidine, nifedipine, lisinopril  FEN/GI: Carb modified diet/ SLIV Prophylaxis: SubQ Hep  Disposition: Pending clinical improvement  Subjective:  Feels improved chest pain and denies chest pain overnight. Denies SOB, Nausea/vomiting  Objective: Temp:  [98 F (36.7 C)-98.1 F (36.7 C)] 98.1 F (36.7 C) (08/09 0504) Pulse Rate:  [59-74] 59 (08/09 0504) Resp:  [16-18] 18 (08/09 0504) BP: (115-137)/(52-77) 116/53 mmHg (08/09 0504) SpO2:  [94 %-100 %] 94 % (08/09 0504) Weight:  [154 lb 1.6 oz (69.9 kg)-158 lb 11 oz (71.981 kg)] 154 lb 1.6 oz (69.9 kg) (08/08 1801) Physical Exam: General: alert, NAD, well appearing HEENT: NCAT, MMM.  Chest wall: non-tender to palpation  Lungs: CTAB, normal respiratory effort Heart: RRR, no M/R/G.  Abdomen: Bowel sounds normal; abdomen soft and nontender.  Extremities: WWP, no LE edema   Laboratory:  Recent Labs Lab 11/11/14 1925  WBC 6.7  HGB  12.9  HCT 39.1  PLT 322    Recent Labs Lab 11/11/14 1925  NA 139  K 3.5  CL 98*  CO2 32  BUN 16  CREATININE 0.63  CALCIUM 9.6  PROT 7.9  BILITOT 0.4  ALKPHOS 98  ALT 14  AST 23  GLUCOSE 129*    Imaging/Diagnostic Tests: CXR 8/8 No acute disease.   Bonney Aid, MD 11/12/2014, 8:56 AM PGY-2,  Family Medicine FPTS Intern pager: [phone], text pages welcome

## 2014-11-13 ENCOUNTER — Telehealth: Payer: Self-pay | Admitting: Cardiology

## 2014-11-13 DIAGNOSIS — R079 Chest pain, unspecified: Secondary | ICD-10-CM

## 2014-11-13 NOTE — Telephone Encounter (Signed)
New Message       Pt's grand daughter calling to schedule stress test, no order in Epic. Please call back and advise.

## 2014-11-13 NOTE — Telephone Encounter (Addendum)
Per Dr. Mayford Knife, patient to have STRESS MYOVIEW. Reviewed instructions with patient's granddaughter with her permission. Ordered for scheduling.

## 2014-11-19 ENCOUNTER — Ambulatory Visit: Payer: Self-pay

## 2014-11-19 ENCOUNTER — Ambulatory Visit (INDEPENDENT_AMBULATORY_CARE_PROVIDER_SITE_OTHER): Payer: No Typology Code available for payment source | Admitting: Internal Medicine

## 2014-11-19 ENCOUNTER — Telehealth (HOSPITAL_COMMUNITY): Payer: Self-pay

## 2014-11-19 ENCOUNTER — Encounter: Payer: Self-pay | Admitting: Internal Medicine

## 2014-11-19 VITALS — BP 112/56 | HR 59 | Temp 98.2°F | Ht 59.0 in | Wt 153.0 lb

## 2014-11-19 DIAGNOSIS — R079 Chest pain, unspecified: Secondary | ICD-10-CM

## 2014-11-19 DIAGNOSIS — I10 Essential (primary) hypertension: Secondary | ICD-10-CM

## 2014-11-19 DIAGNOSIS — I5189 Other ill-defined heart diseases: Secondary | ICD-10-CM

## 2014-11-19 DIAGNOSIS — I519 Heart disease, unspecified: Secondary | ICD-10-CM

## 2014-11-19 MED ORDER — FUROSEMIDE 20 MG PO TABS: 20.0000 mg | ORAL_TABLET | Freq: Every day | ORAL | Status: DC

## 2014-11-19 MED ORDER — ASPIRIN 81 MG PO TABS: 81.0000 mg | ORAL_TABLET | Freq: Every day | ORAL | Status: AC

## 2014-11-19 MED ORDER — ATORVASTATIN CALCIUM 40 MG PO TABS: 40.0000 mg | ORAL_TABLET | Freq: Every day | ORAL | Status: DC

## 2014-11-19 MED ORDER — LISINOPRIL 40 MG PO TABS: 40.0000 mg | ORAL_TABLET | Freq: Every day | ORAL | Status: DC

## 2014-11-19 MED ORDER — NIFEDIPINE ER OSMOTIC RELEASE 60 MG PO TB24: 60.0000 mg | ORAL_TABLET | Freq: Every day | ORAL | Status: DC

## 2014-11-19 NOTE — Assessment & Plan Note (Signed)
Maximize cardioprotective therapy by switching simvastatin 40 mg to atorvastatin 40 mg. Confirmed coverage through the health department.

## 2014-11-19 NOTE — Telephone Encounter (Signed)
Left message on voicemail in reference to upcoming appointment scheduled for 11-21-2014. Phone number given for a call back so details instructions can be given. Carol Finley A    

## 2014-11-19 NOTE — Assessment & Plan Note (Signed)
Continue lasix 20 mg.

## 2014-11-19 NOTE — Patient Instructions (Addendum)
Today we talked about your blood pressure and hospital visit for chest pain.   Continue furosemide 20 mg, lisinopril 40 mg and nifedipine 60 mg for your blood pressure. Continue to take a daily aspirin. We changed your cholesterol medication to lipitor 40 mg daily.   Please return in 2-3 weeks to discuss the results of your cardiac stress test and recheck your blood pressure.  Thank you!

## 2014-11-19 NOTE — Assessment & Plan Note (Addendum)
Well-controlled on current regimen. Continue to decrease medications as blood pressure can tolerate. Would decrease nifedipine dose at next visit if blood pressures remain soft/well controlled. Encouraged patient to check her blood pressures as able and to make clinic aware of any systolic readings above 150 and below 100.

## 2014-11-19 NOTE — Progress Notes (Signed)
Subjective: Carol Finley is a 64 y.o. female patient of Beverely Low, MD presenting for hospital follow-up for chest pain. Pain has improved, and she was able to return to work yesterday (11/18/14). She is requesting medication refills.   #chest pain - Continues to have daily mild pain in her left chest, shoulder and neck that comes and goes quickly. It does not seem to be made worse by anything. Improves with Tylenol.  - Denies associated diaphoresis, nausea, shortness of breath - Has cardiac stress test scheduled for 11/21/14   #HTN - HCTZ 25 mg was discontinued during last hospitalization - denies headache, changes in vision, dizziness  - ROS: Negative except as mentioned above.  - Nonsmoker  Objective: BP 112/56 mmHg  Pulse 59  Temp(Src) 98.2 F (36.8 C) (Oral)  Ht 4\' 11"  (1.499 m)  Wt 153 lb (69.4 kg)  BMI 30.89 kg/m2 Gen: 64 y.o. well-appearing female in no distress Lungs: Soft bibasilar crackles. Otherwise, CTAB.   Heart: RRR, no M/R/G. 2+ DP pulses bilaterally.  Abdomen: Bowel sounds present, abdomen soft and nontender.  Extremities: Trace edema to ankles bilaterally.  Skin: Warm and well-perfused  Assessment/Plan: Carol Finley is a 64 y.o. female here for hospital follow-up. Patient recently hospitalized from 11/11/14 to 11/12/14 for workup of atypical chest pain. ACS workup was negative but an ECHO showed grade 1 diastolic dysfunction with EF 55-60%. ASCVD score is 10.7% risk of heart disease or stroke.   See problem list for plan. Return in 2-3 weeks to repeat blood pressure and review cardiac stress test.

## 2014-11-20 ENCOUNTER — Telehealth (HOSPITAL_COMMUNITY): Payer: Self-pay

## 2014-11-20 NOTE — Telephone Encounter (Signed)
Spanish telephone line interpreter [phone] ID)used to give instructions to patient.Patient given detailed instructions per Myocardial Perfusion Study Information Sheet for test on 11-21-2014 at 0715. Patient Notified to arrive 15 minutes early, and that it is imperative to arrive on time for appointment to keep from having the test rescheduled. Patient verbalized understanding. Randa Evens, Larin Depaoli A

## 2014-11-21 ENCOUNTER — Ambulatory Visit (HOSPITAL_COMMUNITY): Payer: No Typology Code available for payment source | Attending: Cardiology

## 2014-11-21 DIAGNOSIS — R079 Chest pain, unspecified: Secondary | ICD-10-CM

## 2014-11-21 DIAGNOSIS — R9439 Abnormal result of other cardiovascular function study: Secondary | ICD-10-CM | POA: Insufficient documentation

## 2014-11-21 LAB — MYOCARDIAL PERFUSION IMAGING
CHL CUP NUCLEAR SRS: 13
LHR: 0.27
LV sys vol: 61 mL
LVDIAVOL: 122 mL
NUC STRESS TID: 0.95
Peak HR: 82 {beats}/min
Rest HR: 60 {beats}/min
SDS: 2
SSS: 15

## 2014-11-21 MED ORDER — REGADENOSON 0.4 MG/5ML IV SOLN
0.4000 mg | Freq: Once | INTRAVENOUS | Status: AC
  Administered 2014-11-21: 0.4 mg via INTRAVENOUS

## 2014-11-21 MED ORDER — TECHNETIUM TC 99M SESTAMIBI GENERIC - CARDIOLITE
32.5000 | Freq: Once | INTRAVENOUS | Status: AC | PRN
  Administered 2014-11-21: 33 via INTRAVENOUS

## 2014-11-21 MED ORDER — TECHNETIUM TC 99M SESTAMIBI GENERIC - CARDIOLITE
10.6000 | Freq: Once | INTRAVENOUS | Status: AC | PRN
  Administered 2014-11-21: 11 via INTRAVENOUS

## 2014-12-31 ENCOUNTER — Telehealth: Payer: Self-pay | Admitting: Family Medicine

## 2014-12-31 MED ORDER — GABAPENTIN 300 MG PO CAPS: 300.0000 mg | ORAL_CAPSULE | Freq: Two times a day (BID) | ORAL | Status: DC

## 2014-12-31 NOTE — Telephone Encounter (Signed)
Patient requestsrefill for gabapentin 300 mg to be sent to [facility]. Please, follow up with Patient (Spanish).

## 2015-01-27 ENCOUNTER — Other Ambulatory Visit: Payer: Self-pay | Admitting: *Deleted

## 2015-01-27 DIAGNOSIS — I10 Essential (primary) hypertension: Secondary | ICD-10-CM

## 2015-01-30 ENCOUNTER — Telehealth: Payer: Self-pay | Admitting: Family Medicine

## 2015-01-30 NOTE — Telephone Encounter (Signed)
Patient requests refill for clonidine 0.3 mg.  Please, follow up with Patient (Spanish).

## 2015-02-03 MED ORDER — CLONIDINE HCL 0.3 MG PO TABS: 0.3000 mg | ORAL_TABLET | Freq: Two times a day (BID) | ORAL | Status: DC

## 2015-02-11 ENCOUNTER — Ambulatory Visit (INDEPENDENT_AMBULATORY_CARE_PROVIDER_SITE_OTHER): Payer: No Typology Code available for payment source | Admitting: Family Medicine

## 2015-02-11 ENCOUNTER — Encounter: Payer: Self-pay | Admitting: Family Medicine

## 2015-02-11 VITALS — BP 128/78 | HR 71 | Temp 98.8°F | Ht 59.0 in | Wt 155.0 lb

## 2015-02-11 DIAGNOSIS — B351 Tinea unguium: Secondary | ICD-10-CM

## 2015-02-11 DIAGNOSIS — I10 Essential (primary) hypertension: Secondary | ICD-10-CM

## 2015-02-11 DIAGNOSIS — E118 Type 2 diabetes mellitus with unspecified complications: Secondary | ICD-10-CM

## 2015-02-11 DIAGNOSIS — E1165 Type 2 diabetes mellitus with hyperglycemia: Secondary | ICD-10-CM

## 2015-02-11 DIAGNOSIS — Z23 Encounter for immunization: Secondary | ICD-10-CM

## 2015-02-11 LAB — POCT GLYCOSYLATED HEMOGLOBIN (HGB A1C): Hemoglobin A1C: 7.8

## 2015-02-11 MED ORDER — TERBINAFINE HCL 250 MG PO TABS: 250.0000 mg | ORAL_TABLET | Freq: Every day | ORAL | Status: DC

## 2015-02-11 NOTE — Patient Instructions (Signed)
Tia de las uas (Nail Ringworm) Usted presenta una infeccin por hongos en las uas de los pies. La parte visible de las uas est formada por clulas muertas que no tienen suministro sanguneo que intervenga en la prevencin de las infecciones. La infeccin se produce debido a que los hongos estn en todas partes. Aprovecharn cualquier oportunidad para crecer sobre material inerte. Esto incluye los tejidos de su cuerpo formados por clulas muertas.  Como las uas tienen un crecimiento muy lento, requieren hasta 2 aos de tratamiento con medicamentos antimicticos. La infeccin involucra a toda la ua, hasta la base. Incluye aproximadamente 1/3 de la ua que no puede verse. Si el profesional le ha prescrito un medicamento por boca, tmelo todos los das. No podr ver ningn progreso hasta que hayan transcurrido entre 6 y 9 meses. No debe preocuparse. La curacin es lenta. Se debe a que el medicamento llega hasta la infeccin de manera muy lenta. Los hongos pueden vivir sobre las clulas muertas con poca o casi ninguna exposicin al suministro de sangre. Esta tambin es la razn por la cual no se observa mejora en los primeros 6 meses. La ua comienza la curacin en la base, donde hay suministro de sangre. La medicacin tpica, como las cremas y los ungentos generalmente no son eficaces. Usted junto al profesional podrn elegir acelerar el proceso de curacin con la extraccin quirrgica de todas las uas. An as, puede necesitar entre 6 y 9 meses de medicamentos por va oral adicionales. Concurra a la consulta con el profesional que lo asiste de acuerdo a lo que le haya indicado. Recuerde que no observar mejora durante al menos 6 meses. Consulte antes con el profesional si aparecen otros signos de infeccin (p. ej. enrojecimiento e hinchazn).   Esta informacin no tiene como fin reemplazar el consejo del mdico. Asegrese de hacerle al mdico cualquier pregunta que tenga.   Document Released:  12/30/2004 Document Revised: 08/06/2014 Elsevier Interactive Patient Education 2016 [employer]  

## 2015-02-12 ENCOUNTER — Other Ambulatory Visit: Payer: Self-pay | Admitting: Family Medicine

## 2015-02-12 DIAGNOSIS — I10 Essential (primary) hypertension: Secondary | ICD-10-CM

## 2015-02-12 MED ORDER — NIFEDIPINE ER OSMOTIC RELEASE 60 MG PO TB24: 60.0000 mg | ORAL_TABLET | Freq: Every day | ORAL | Status: DC

## 2015-02-12 NOTE — Progress Notes (Signed)
   Subjective:   Carol Finley is a 64 y.o. female with a history of DM, HTN, OA here for DM f/u  CHRONIC DIABETES  Disease Monitoring  Blood Sugar Ranges: 80-110 fasting  Polyuria: no   Visual problems: yes, unchanged   Medication Compliance: yes, 15-20u NPH bid depending on morning CBG  Medication Side Effects  Hypoglycemia: no   Preventitive Health Care  Eye Exam: due, advised patient to complete as able  Foot Exam: done today  Diet pattern: well balanced, lots of veggies  Exercise: walks 15 minutes every day    Review of Systems:  Per HPI. All other systems reviewed and are negative.   PMH, PSH, Medications, Allergies, and FmHx reviewed and updated in EMR.  Social History: never smoker  Objective:  BP 128/78 mmHg  Pulse 71  Temp(Src) 98.8 F (37.1 C) (Oral)  Ht 4\' 11"  (1.499 m)  Wt 155 lb (70.308 kg)  BMI 31.29 kg/m2  SpO2 95%  Gen:  64 y.o. female in NAD HEENT: NCAT, MMM, EOMI, PERRL, anicteric sclerae CV: RRR, no MRG, no JVD Resp: Non-labored, CTAB, no wheezes noted Abd: Soft, NTND, BS present, no guarding or organomegaly Ext: WWP, no edema MSK: Full ROM, strength intact Neuro: Alert and oriented, speech normal      Chemistry      Component Value Date/Time   NA 136 11/12/2014 0558   K 3.5 11/12/2014 0558   CL 100* 11/12/2014 0558   CO2 29 11/12/2014 0558   BUN 17 11/12/2014 0558   CREATININE 0.68 11/12/2014 0558   CREATININE 0.74 09/25/2013 0934      Component Value Date/Time   CALCIUM 8.8* 11/12/2014 0558   ALKPHOS 98 11/11/2014 1925   AST 23 11/11/2014 1925   ALT 14 11/11/2014 1925   BILITOT 0.4 11/11/2014 1925      Lab Results  Component Value Date   WBC 6.7 11/11/2014   HGB 12.9 11/11/2014   HCT 39.1 11/11/2014   MCV 83.2 11/11/2014   PLT 322 11/11/2014   Lab Results  Component Value Date   TSH 4.402 11/11/2014   Lab Results  Component Value Date   HGBA1C 7.8 02/11/2015   Assessment & Plan:     Carol Finley is a 64 y.o. female here for dm f/u  Essential hypertension, benign Stable, well controlled with symptoms or side effects - continue clonidine, lasix, lisinopril, nifedipine - f/u in 6 months  Diabetes type 2, uncontrolled A1c improved to 7.8, no hypoglycemia - given age and comorbidities, will not push for <7 - continue NPH 15-20 BID - discussed increasing physical activity as able, currently walking 15 minutes daily - continue metformin 1g bid - f/u in 3 months  ONYCHOMYCOSIS, BILATERAL Severe toenail fungus affecting 8/10 nails diffusely - discussed treatment options, no pain but patient finds it irritating and does not like appearance, would like to treat - may be a cost issue so printed script given for her shop around, treatment for at least 3 months with oral terbinafine, likely longer - LFTs normal recently so will not repeat today - assess LFTs in 3 months if patient able to afford      Carol Low, MD, MPH [facility] PGY-3 02/12/2015 2:09 PM

## 2015-02-12 NOTE — Telephone Encounter (Signed)
Patient request authorization to pharmacy for Procardia. Please, follow up with Patient (Spanish).

## 2015-02-12 NOTE — Assessment & Plan Note (Signed)
Stable, well controlled with symptoms or side effects - continue clonidine, lasix, lisinopril, nifedipine - f/u in 6 months

## 2015-02-12 NOTE — Assessment & Plan Note (Signed)
A1c improved to 7.8, no hypoglycemia - given age and comorbidities, will not push for <7 - continue NPH 15-20 BID - discussed increasing physical activity as able, currently walking 15 minutes daily - continue metformin 1g bid - f/u in 3 months

## 2015-02-12 NOTE — Assessment & Plan Note (Signed)
Severe toenail fungus affecting 8/10 nails diffusely - discussed treatment options, no pain but patient finds it irritating and does not like appearance, would like to treat - may be a cost issue so printed script given for her shop around, treatment for at least 3 months with oral terbinafine, likely longer - LFTs normal recently so will not repeat today - assess LFTs in 3 months if patient able to afford

## 2015-04-16 ENCOUNTER — Encounter: Payer: Self-pay | Admitting: Family Medicine

## 2015-04-16 ENCOUNTER — Ambulatory Visit (INDEPENDENT_AMBULATORY_CARE_PROVIDER_SITE_OTHER): Payer: No Typology Code available for payment source | Admitting: Family Medicine

## 2015-04-16 VITALS — BP 157/72 | HR 75 | Temp 98.0°F | Wt 158.0 lb

## 2015-04-16 DIAGNOSIS — E118 Type 2 diabetes mellitus with unspecified complications: Secondary | ICD-10-CM

## 2015-04-16 DIAGNOSIS — I10 Essential (primary) hypertension: Secondary | ICD-10-CM

## 2015-04-16 DIAGNOSIS — H547 Unspecified visual loss: Secondary | ICD-10-CM

## 2015-04-16 DIAGNOSIS — B351 Tinea unguium: Secondary | ICD-10-CM

## 2015-04-16 DIAGNOSIS — Z113 Encounter for screening for infections with a predominantly sexual mode of transmission: Secondary | ICD-10-CM | POA: Insufficient documentation

## 2015-04-16 DIAGNOSIS — E1165 Type 2 diabetes mellitus with hyperglycemia: Secondary | ICD-10-CM

## 2015-04-16 LAB — COMPLETE METABOLIC PANEL WITH GFR
ALBUMIN: 3.9 g/dL (ref 3.6–5.1)
ALK PHOS: 99 U/L (ref 33–130)
ALT: 16 U/L (ref 6–29)
AST: 24 U/L (ref 10–35)
BILIRUBIN TOTAL: 0.4 mg/dL (ref 0.2–1.2)
BUN: 16 mg/dL (ref 7–25)
CALCIUM: 9.2 mg/dL (ref 8.6–10.4)
CO2: 28 mmol/L (ref 20–31)
CREATININE: 0.6 mg/dL (ref 0.50–0.99)
Chloride: 100 mmol/L (ref 98–110)
Glucose, Bld: 144 mg/dL — ABNORMAL HIGH (ref 65–99)
Potassium: 4.3 mmol/L (ref 3.5–5.3)
Sodium: 138 mmol/L (ref 135–146)
TOTAL PROTEIN: 7.1 g/dL (ref 6.1–8.1)

## 2015-04-16 LAB — POCT GLYCOSYLATED HEMOGLOBIN (HGB A1C): HEMOGLOBIN A1C: 8.1

## 2015-04-16 MED ORDER — CLONIDINE HCL 0.3 MG PO TABS: 0.3000 mg | ORAL_TABLET | Freq: Three times a day (TID) | ORAL | Status: DC

## 2015-04-16 MED ORDER — TERBINAFINE HCL 250 MG PO TABS: 250.0000 mg | ORAL_TABLET | Freq: Every day | ORAL | Status: DC

## 2015-04-16 NOTE — Patient Instructions (Signed)
Cambiamos su clonidine a tres veces al dia. Trata de usar la insulina cada dia pero puede usar menos unidades cuando esta baja.

## 2015-04-17 LAB — HEPATITIS C ANTIBODY: HCV Ab: NEGATIVE

## 2015-04-17 LAB — HIV ANTIBODY (ROUTINE TESTING W REFLEX): HIV: NONREACTIVE

## 2015-04-17 NOTE — Assessment & Plan Note (Signed)
A1c up slightly to 8.1, goal <8 given age and comorbidities, poor compliance with insulin, fasting glucose 67-130 per patient, does not take insulin when <100 - discussed taking fewer units and waiting until a meal when glucose 80-100, patient agreeable to this plan - humulin 20u bid when glucose >100 - humulin 10u in am if glucose 80-100 - call for any lows, rec checking later in day, especially on days when she does not take insulin, bring log to next appt - continue metformin 1g bid

## 2015-04-17 NOTE — Assessment & Plan Note (Signed)
Having mechanical trouble with glasses, needs repair, not seen by optho in over 2 years - refer to optho for dm retinopathy assessment as well as corrective lenses

## 2015-04-17 NOTE — Assessment & Plan Note (Signed)
Some improvement after 12 weeks of terbinafine, bases appear clear but large portion of nail still quite diseased - CMP today for LFT monitoring - continue terbinafine - will not plan to continue beyond 12 additional weeks, will wait for nail to grow out to assess response to treatment

## 2015-04-17 NOTE — Progress Notes (Signed)
Subjective:   Carol Finley is a 65 y.o. female with a history of DM, HTN, OA here for DM f/u  CHRONIC DIABETES  Disease Monitoring  Blood Sugar Ranges: 67-130 fasting  Polyuria: no   Visual problems: yes, unchanged   Medication Compliance: yes, 15-20u NPH bid depending on morning CBG  Medication Side Effects  Hypoglycemia: no   Preventitive Health Care  Eye Exam: due, referral entered today  Foot Exam: done today  Diet pattern: well balanced, lots of veggies  Exercise: walks 15 minutes every day    Review of Systems:  Per HPI. All other systems reviewed and are negative.   PMH, PSH, Medications, Allergies, and FmHx reviewed and updated in EMR.  Social History: never smoker  Objective:  BP 157/72 mmHg  Pulse 75  Temp(Src) 98 F (36.7 C) (Oral)  Wt 158 lb (71.668 kg)  Gen:  65 y.o. female in NAD HEENT: NCAT, MMM, EOMI, PERRL, anicteric sclerae CV: RRR, no MRG, no JVD Resp: Non-labored, CTAB, no wheezes noted Abd: Soft, NTND, BS present, no guarding or organomegaly Ext: WWP, no edema MSK: Full ROM, strength intact Neuro: Alert and oriented, speech normal Toes: thickened, yellow and porrous nails with some clear/healthy nail proximally ~2-65mm      Chemistry      Component Value Date/Time   NA 138 04/16/2015 1039   K 4.3 04/16/2015 1039   CL 100 04/16/2015 1039   CO2 28 04/16/2015 1039   BUN 16 04/16/2015 1039   CREATININE 0.60 04/16/2015 1039   CREATININE 0.68 11/12/2014 0558      Component Value Date/Time   CALCIUM 9.2 04/16/2015 1039   ALKPHOS 99 04/16/2015 1039   AST 24 04/16/2015 1039   ALT 16 04/16/2015 1039   BILITOT 0.4 04/16/2015 1039      Lab Results  Component Value Date   WBC 6.7 11/11/2014   HGB 12.9 11/11/2014   HCT 39.1 11/11/2014   MCV 83.2 11/11/2014   PLT 322 11/11/2014   Lab Results  Component Value Date   TSH 4.402 11/11/2014   Lab Results  Component Value Date   HGBA1C 8.1 04/16/2015   Assessment &  Plan:     Carol Finley is a 65 y.o. female here for dm f/u  Decreased visual acuity Having mechanical trouble with glasses, needs repair, not seen by optho in over 2 years - refer to optho for dm retinopathy assessment as well as corrective lenses  Essential hypertension, benign Elevated today, has not taken clonidine yet, asympt - increase clonidine to TID dosing and caution patient about rebound if doses are skipped. - continue lisinopril and procardia - f/u in 1 month to assess control on new regimen and tolerability  Diabetes type 2, uncontrolled A1c up slightly to 8.1, goal <8 given age and comorbidities, poor compliance with insulin, fasting glucose 67-130 per patient, does not take insulin when <100 - discussed taking fewer units and waiting until a meal when glucose 80-100, patient agreeable to this plan - humulin 20u bid when glucose >100 - humulin 10u in am if glucose 80-100 - call for any lows, rec checking later in day, especially on days when she does not take insulin, bring log to next appt - continue metformin 1g bid  ONYCHOMYCOSIS, BILATERAL Some improvement after 12 weeks of terbinafine, bases appear clear but large portion of nail still quite diseased - CMP today for LFT monitoring - continue terbinafine - will not plan to continue beyond 12 additional weeks,  will wait for nail to grow out to assess response to treatment       Carol Low, MD, MPH [facility] PGY-3 04/17/2015 12:12 PM

## 2015-04-17 NOTE — Assessment & Plan Note (Addendum)
Elevated today, has not taken clonidine yet, asympt - increase clonidine to TID dosing and caution patient about rebound if doses are skipped. - continue lisinopril and procardia - f/u in 1 month to assess control on new regimen and tolerability

## 2015-04-28 ENCOUNTER — Telehealth: Payer: Self-pay | Admitting: *Deleted

## 2015-04-28 NOTE — Telephone Encounter (Signed)
Pt informed of normal results using spanish interpretor omar [id number]. Deseree Bruna Potter, CMA

## 2015-04-28 NOTE — Telephone Encounter (Signed)
-----   Message from Abram Sander, MD sent at 04/17/2015  9:09 AM EST ----- Please let patient know that all blood work was normal. Thanks!

## 2015-05-22 ENCOUNTER — Encounter: Payer: Self-pay | Admitting: Family Medicine

## 2015-05-22 ENCOUNTER — Ambulatory Visit (INDEPENDENT_AMBULATORY_CARE_PROVIDER_SITE_OTHER): Payer: No Typology Code available for payment source | Admitting: Family Medicine

## 2015-05-22 VITALS — BP 159/64 | HR 68 | Temp 97.7°F | Ht 59.0 in | Wt 158.6 lb

## 2015-05-22 DIAGNOSIS — M5431 Sciatica, right side: Secondary | ICD-10-CM

## 2015-05-22 DIAGNOSIS — G5701 Lesion of sciatic nerve, right lower limb: Secondary | ICD-10-CM

## 2015-05-22 MED ORDER — IBUPROFEN 600 MG PO TABS: 600.0000 mg | ORAL_TABLET | Freq: Three times a day (TID) | ORAL | Status: DC | PRN

## 2015-05-22 NOTE — Patient Instructions (Addendum)
Le he recetado IT trainer. Puedes tomar esto cada 8 horas. Asegrese de tomar [employer] con los alimentos. Le molestar el estmago si no lo hace. Si desarrolla debilidad, cadas o empeoramiento del dolor, por favor regrese a la oficina para ser evaluado. De lo contrario, siga las instrucciones proporcionadas para disminuir la inflamacin.  Sndrome del piriforme con rehabilitacin (Piriformis Syndrome With Rehab) El sndrome piriforme es una enfermedad que afecta el sistema nervioso en la zona de la cadera, y est caracterizado por dolor y Neomia Dear posible prdida de sensibilidad en la parte de atrs (posterior) del muslo que puede extenderse hacia toda la pierna. Los sntomas son debidos a Art gallery manager presin del nervio citico por el msculo piriforme, que se encuentra en la parte posterior de la cadera y es el responsable de rotar la misma hacia afuera. El nervio citico y sus ramificaciones se conectan a gran parte de la pierna. Normalmente, el nervio citico va por el msculo piriforme y otros msculos. Sin embargo, en ciertos individuos el nervio va a travs del [place], lo que ocasiona un aumento en la presin del nervio y Futures trader como resultado los sntomas del sndrome piriforme. SNTOMAS  Dolor, hormigueo, adormecimiento, o ardor en la parte posterior del muslo que puede extenderse a toda la pierna.  Algunas veces, sensibilidad en las nalgas.  Prdida de la funcin de la pierna.  Dolor que empeora al [employer] el msculo piriforme (correr, saltar o Games developer).  Dolor al Personal assistant sentado durante mucho tiempo.  Dolor que disminuye al apoyar la espalda sobre un lugar plano. CAUSAS  El sndrome del piriforme es el resultado de un aumento en la presin sobre el nervio citico. Con frecuencia se trata de una lesin por uso excesivo.  Estrs sobre el nervio que proviene de un sbito aumento en la intensidad, frecuencia o duracin del  entrenamiento.  Compensacin para otras lesiones en las extremidades. LOS RIESGOS AUMENTAN CON  Deportes que implican utilizar el msculo piriforme (correr, saltar o Games developer).  Se nace con eso (congnito), un defecto en el que el nervio citico pasa a travs del muslo. PREVENCIN  Precalentamiento adecuado y elongacin antes de la [place].  Descanso y recuperacin entre actividades.  Mantener la forma fsica:  Earma [place], flexibilidad y resistencia muscular.  Capacidad cardiovascular. PRONSTICO Si se trata adecuadamente, los sntomas se resuelven en 2 a 6 semanas. POSIBLES COMPLICACIONES  Dolor y adormecimiento persistente y posiblemente permanente en la extremidad inferior.  Debilidad en la extremidad que puede progresar a una incapacidad para competir. TRATAMIENTO El tratamiento inicial incluye interrumpir las actividades que agravan los sntomas. El tratamiento inicial incluye el uso de medicamentos y la aplicacin de hielo para reducir Chief Technology Officer y la inflamacin. Los ejercicios de elongacin y fortalecimiento pueden ayudar a reducir Chief Technology Officer con la [place]. Los ejercicios pueden Management consultant o con un terapeuta. Pueden requerir la derivacin a un fisioterapeuta para Magazine features editor evaluacin y Games developer un tratamiento, como ultrasonido. Se podrn administrar medicamentos con corticoides para disminuir la inflamacin que se causa por la presin en el nervio citico. Si no se obtiene xito con Artist, ser necesario someterse a [place].  MEDICAMENTOS  Si necesita analgsicos, se recomiendan los antiinflamatorios no esteroides, como aspirina e ibuprofeno y otros calmantes menores, como acetaminofeno  No tome medicamentos para Chief Technology Officer dentro de los [address] previos a la [place].  Los analgsicos prescriptos se indicarn si el mdico lo considera necesario. Utilcelos como se le  indique y slo cuando lo necesite.  En algunos casos se indica  una inyeccin de corticosteroides. Estas inyecciones deben reservarse para los [place] graves, porque slo se pueden administrar una determinada cantidad de veces. CALOR Y FRO:  El tratamiento con fro [employer] y reduce la inflamacin. El fro debe aplicarse durante 10 a 15 minutos cada 2  3 horas para reducir la inflamacin y Chief Technology Officer e inmediatamente despus de cualquier actividad que agrava los sntomas. Utilice bolsas de hielo o masajee la zona con un trozo de hielo (masaje de hielo).  El calor puede usarse antes de Therapist, music y de las actividades de fortalecimiento indicadas por el profesional, le fisioterapeuta o Orthoptist. Utilice una bolsa trmica o sumerja la lesin en agua caliente. SOLICITE ATENCIN MDICA DE INMEDIATO SI:  El tratamiento no lo beneficia, o el trastorno empeora.  Los medicamentos producen efectos secundarios. EJERCICIOS EJERCICIOS DE AMPLITUD DE MOVIMIENTOS Y ELONGACIN - Sndrome Piriforme Estos ejercicios le ayudarn en la recuperacin de la lesin. Los sntomas podrn aliviarse con o sin asistencia adicional de su mdico, fisioterapeuta o Herbalist. Al completar estos ejercicios, recuerde:   Restaurar la flexibilidad del tejido ayuda a que las articulaciones recuperen el movimiento normal. Esto permite que el movimiento y la actividad sea ms saludables y menos dolorosos.  Para que sea efectiva, cada elongacin debe realizarse durante al menos 30 segundos.  La elongacin nunca debe ser dolorosa. Deber sentir slo un alargamiento o distensin suave del tejido que estira. ELONGACIN - Rotadores de la cadera  Recustese sobre su espalda en una superficie firme. Tome su rodilla derecha / izquierdo con la mano derecha / izquierdo y el tobillo con la mano opuesta.  Con las caderas y los hombros bien apoyados, tire suavemente de la rodilla Sales executive / izquierdo y Photographer parte baja de la pierna hacia el hombro opuesto hasta que sienta un estiramiento en las  nalgas.  Mantenga esta posicin durante __________ segundos. Reptalo __________ veces. Realice este estiramiento __________ [place] por da. ELONGACIN - Banda iliotibial  En el suelo o cama, recustese de lado de modo que la pierna derecha / izquierdo [place]. Incline su rodilla y tmese del tobillo.  Lleve lentamente la rodilla hacia atrs, hasta que el muslo est en lnea con el tronco. Mantenga el taln en las nalgas y arquee suavemente hacia atrs la cabeza, con hombros y caderas alineados.  Baje suavemente la pierna hasta que la rodilla est cerca del suelo o cama hasta sentir un ligero estiramiento en la zona externa del muslo derecha / izquierdo. Si no siente un estiramiento y la rodilla no queda cerca del suelo, coloque el taln del otro pie por arriba de la rodilla y empuje la rodilla y el muslo un poco ms hacia abajo.  Mantenga esta posicin durante __________ segundos. Reptalo __________ veces. Realice este ejercicio __________ veces por da. EJERCICIOS DE FORTALECIMIENTO - Sndrome del piriforme Estos son algunos de los ejercicios que puede realizar hasta que vuelva a ver al profesional que lo asiste o hasta que los sntomas hayan desaparecido. Recuerde:   Los msculos fuertes con buena resistencia toleran mejor el estrs  Realice los ejercicios como inicialmente se los indic el profesional. Avance lentamente con cada ejercicio aumentando gradualmente el nmero de repeticiones y el peso que utiliz segn las indicaciones. FUERZA - Aductores de la cadera, elevacin de la pierna extendida Vigile su posicin durante todo el ejercicio de modo que est seguro que trabajan los msculos correctos. Si lo hace  de manera descuidada, no estar fortaleciendo los msculos correctos.  Descanse sobre un lado de modo que su cabeza, hombros, rodilla y cadera queden alineados. Puede doblar la rodilla que est por debajo para mantener el equilibrio. Su pierna derecha / izquierdo debe [place].  Ruede con la cadera [employer], de modo que ambas queden lineadas y su rodilla derecha / izquierdo quede hacia adelante.  Levante 10 a 15 cm la pierna que qued [place], haciendo fuerza con el taln. Asegrese que el pie no se vaya hacia adelante ni que la rodilla ruede [place].  Mantenga esta posicicin durante __________ segundos. Debe sentir los msculos de la cadera se elevan (podr no notarlo hasta que comience a sentir cansancio en la pierna).  Baje lentamente la pierna hasta la posicin inicial. Permita que los msculos se relajen completamente antes de repetir. Reptalo __________ veces. Realice este ejercicio __________ veces por da.  FUERZA - Abductores de cadera, cuadrpedo  [employer]el[employer] Corporation y las rodillas en una superficie acolchada. Las manos deben estar directamente sobre los hombros y las rodillas debajo de las caderas.  Mantenga la rodilla derecha / izquierdo doblada, levante y lleve la pierna hacia un lado. Mantenga el nivel de las piernas en lnea con los hombros.  Colquese sobre las manos y las rodillas como se indica en la figura.  Mantenga esta posicin durante __________ segundos.  Mantenga el tronco firme y las caderas niveladas, y baje lentamente la pierna hasta la posicin inicial. Reptalo __________ veces. Realice este ejercicio __________ veces por da.  FUERZA - Abductores de cadera, de pie  Asegure un extremo de una banda de goma para ejercicios a un objeto fijo (mesa, columna) y haga un lazo en el otro extremo.  Coloque el lazo alrededor de su tobillo derecha / izquierdo. Con el tobillo con la banda en el extremo opuesto al asegurado, aljese hasta que sienta que la banda se tensa.  Sostngase de una silla para mantener el equilibrio.  Con la espalda erguida, los hombros [employer] caderas y los dedos de los pies hacia adelante, levante la pierna derecha / izquierdo hacia un lado. Asegrese de Printmaker fuerza con los  msculos de las caderas. No "lance" la pierna ni incline el cuerpo para levantarla.  De manera controlada, vuelva lentamente a la posicin inicial. Repita el ejercicio __________ veces. Realice este ejercicio __________ veces por da.    Esta informacin no tiene Theme park manager el consejo del mdico. Asegrese de hacerle al mdico cualquier pregunta que tenga.   Document Released: 01/06/2006 Document Revised: 08/06/2014 Elsevier Interactive Patient Education [employer]

## 2015-05-22 NOTE — Progress Notes (Signed)
   Subjective: CC: foot pain [id number] Carol Finley is a 65 y.o. female presenting to clinic today for same day appointment. PCP: Beverely Low, MD Concerns today include:  Spanish interpretation provided by [employer] Video Interpreter [name]  1. RLE pain Patient notes that pain is very severe in her Right foot.  She notes that pain so bad that she gets sweaty and shaky from it.  Pain started on Tues night.  Pain radiates to right hip.  Patient has never had pain like this before.  No injury, falls, weakness, fevers.  She endorses numbness or tingling since the pain started.  Sensation changes remain regardless of whether pain is present.  She occ has numbness and tingling in the left foot.  Pain is intermittent.  She notes that pain is relieved by Tylenol and heat.  Tylenol lasts for about 5-6 hours.  No associated back pain.  Pain is worse with cold and walking.  Social History Reviewed: non smoker. FamHx and MedHx reviewed.  Please see EMR.  ROS: Per HPI  Objective: Office vital signs reviewed. BP 159/64 mmHg  Pulse 68  Temp(Src) 97.7 F (36.5 C) (Oral)  Ht 4\' 11"  (1.499 m)  Wt 158 lb 9.6 oz (71.94 kg)  BMI 32.02 kg/m2  Physical Examination:  General: Awake, alert, well nourished, No acute distress HEENT: Normal, MMM Extremities: warm, well perfused, No edema, cyanosis or clubbing; +2 posterior tibial pulses bilaterally.  Thickened toenails bilaterally.  No ulceration or callous formation.  No bony deformities.  Light touch sensation in tact bilateral LE. Negative FADIR, Negative FABER, Negative Straight leg test MSK: Normal gait and station, 5/5 LE strength, no midline or paraspinal mm tenderness  Neuro: Strength and sensation grossly intact, patellar DTRs 2/4  Assessment/ Plan: 65 y.o. female   1. Sciatica of right side.  Likely in the setting of piriformis syndrome.  No focal neuro deficits on exam.  No spinal abnormalities one exam.  Seems to respond well to  Tylenol most of the time.  Will prescribe an antiinflammatory. - Piriformis syndrome exercises provided - Patient to apply heat as needed to relieve pain - ibuprofen (ADVIL,MOTRIN) 600 MG tablet; Take 1 tablet (600 mg total) by mouth every 8 (eight) hours as needed.  Dispense: 30 tablet; Refill: 0 - Advised to take with a meal. - Strict return precautions provided, patient voiced good understanding - Follow up with PCP as needed   Raliegh Ip, DO PGY-2, [facility]

## 2015-05-27 ENCOUNTER — Encounter: Payer: Self-pay | Admitting: Family Medicine

## 2015-05-27 ENCOUNTER — Ambulatory Visit (INDEPENDENT_AMBULATORY_CARE_PROVIDER_SITE_OTHER): Payer: No Typology Code available for payment source | Admitting: Family Medicine

## 2015-05-27 VITALS — BP 140/61 | HR 70 | Temp 98.0°F | Wt 160.0 lb

## 2015-05-27 DIAGNOSIS — M5431 Sciatica, right side: Secondary | ICD-10-CM

## 2015-05-27 MED ORDER — PREDNISONE 10 MG PO TABS: ORAL_TABLET | ORAL | Status: DC

## 2015-05-27 NOTE — Patient Instructions (Signed)
  Gracias por venir a Estate manager/land agent.  1. Probablemente tenga "dolor nervioso" en su pierna derecha. Esto puede ser de la Artritis en su espalda. - Tome Prednisone (medicamento nuevo) segn lo prescrito, el da 1 tome 6 tabletas con comida, luego el da 2 tome 5 tabletas, luego el da siguiente tome 4, luego 3, luego 2 y luego 1 para Midwife. Una vez que termine, usted puede reanudar el Ibuprofen como usted era, 3 veces al da con alimento. - use una almohadilla de calefaccin en la parte baja de la espalda y la pierna cuando sea necesario - Trate de no hacer ms actividades, pero mantenerse en movimiento  Por favor, programar una cita de seguimiento con el Dr. Richarda Blade en 2 semanas para el seguimiento de Dolor en la pierna derecha / citica, si no mejor, entonces se discutir la referencia PT, ortopedia vs medicina deportiva  Si tiene [place] pregunta o inquietud, no dude en llamar a la clnica para ponerse en contacto conmigo. Tambin puede programar una cita previa si es necesario.  Sin embargo, si sus sntomas empeoran significativamente, vaya al Departamento de Emergencias para buscar atencin mdica inmediata.   ----------------------  Thank you for coming in to clinic today.  1. You most likely have "Nerve Pain" in your Right Leg. This may be from the Arthritis in your Back. - Take Prednisone (new medicine) as prescribed, Day 1 take 6 tablets with food, then Day 2 take 5 tablets, then next day take 4, then 3, then 2, then 1 to finish. Once completed, you may resume the Ibuprofen as you were, 3 times a day with food. - use heating pad on your low back and your leg as needed - Try not to over do activities, but stay moving  Please schedule a follow-up appointment with Dr Richarda Blade in 2 weeks to follow-up Right Leg Pain / Sciatica, if not better, then will discuss referral PT, Orthopedics vs Sports medicine  If you have any other questions or concerns, please feel free to call the clinic  to contact me. You may also schedule an earlier appointment if necessary.  However, if your symptoms get significantly worse, please go to the Emergency Department to seek immediate medical attention.  Saralyn Pilar, DO [facility]

## 2015-05-27 NOTE — Progress Notes (Signed)
Subjective:    Patient ID: Carol Finley, female    DOB: 04/06/1950, 65 y.o.   MRN: [id number]  Katheen Finley is a 65 y.o. female presenting on 05/27/2015 for Foot Pain   Patient presents for a same day appointment. History provided by patient in Spanish, with assistance of Video Interpreter Lya [phone]   HPI  RIGHT LOWER EXTREMITY PAIN: - Last seen at [facility] 05/22/15, for same complaint with Right LE / foot pain, diagnosed per chart review with R-sciatica vs piriformis syndrome, treated with Ibuprofen  q 8 hr PRN, did help relieve pain "just a little bit", and the pain came back today "really bad" - Reports symptoms initially started about 7 days ago on 2/14. Described pain as "aching" in Right foot that goes "up to" her thigh without significant endorsed low back pain, associated with numbness and tingling, severity at onset 8/10 and similar today at 8/10, with ibuprofen "some relief" for 3-4 hours, worse with ambulation, tried heating pad with some relief. 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 Smoking status: Never Smoker   . Smokeless tobacco: None  . Alcohol Use: No    Review of Systems Per HPI unless specifically indicated above     Objective:    BP 140/61 mmHg  Pulse 70  Temp(Src) 98 F (36.7 C) (Oral)  Wt 160 lb (72.576 kg)  Wt Readings from Last 3 Encounters:  05/27/15 160 lb (72.576 kg)  05/22/15 158 lb 9.6 oz (71.94 kg)  04/16/15 158 lb (71.668 kg)    Physical Exam  Constitutional: She is oriented to person, place, and time. She appears well-developed and well-nourished. No  distress.  Well-appearing, comfortable, cooperative  HENT:  Head: Normocephalic and atraumatic.  Neck: Normal range of motion. Neck supple.  Cardiovascular: Normal rate and intact distal pulses.   Pulmonary/Chest: Effort normal.  Musculoskeletal:  Low Back Inspection: Normal, without deformity Palpation: Non-tender over spinous processes, no significant hypertonicity muscles ROM: Slightly limited extension with LBP, Forward flexion good ROM. Special Testing: Standing ext facet load with some pain R>L, seated SLR with some pain radiating into Right leg, questionably radicular Strength: Lower ext hip flex, knee flex, ankle dorsiflex 5/5 Neurovascular: distally intact  Feet Inspection: Normal appearing, without edema Palpation: non-tender, warm ROM: Normal   Neurological: She is alert and oriented to person, place, and time.  Intact distal sensation to light touch bilateral feet and lower ext.  Skin: Skin is warm and dry. No rash noted. She is not diaphoretic.  Nursing note and vitals reviewed.      Assessment & Plan:   Problem List Items Addressed This Visit    Sciatica - Primary    Suspect R-LE symptoms with some sciatica may be related to lumbar DJD with facet arthropathy (06/2013 x-ray), symptoms mostly in R thigh down to foot however. Improved on ibuprofen now symptoms returned. No red flag symptoms for LBP. Diff Dx: Concern possible neurogenic claudication (history not entirely suggestive, but without back pain this may be likely in eldelry patient), unlikely vascular claudication with good distal pulses also neuropathic pain less likely. Possible piriformis syndrome as considered before.  Plan: 1. Trial on prednisone dose pak 60  to 10 mg x 6 day taper, then may resume Ibuprofen PRN 2. Follow-up 2 weeks, if significantly improved then may be nerve impingement vs claudication from Lumbar DJD, would consider referral to Ortho vs trial PT. May ultimately need advanced imaging,  epidural injections if facetogenic pain      Relevant Medications   predniSONE (DELTASONE) 10 MG tablet      Meds ordered this encounter  Medications  . DISCONTD: predniSONE (DELTASONE) 10 MG tablet    Sig: Take 6 tabs with breakfast Day 1, 5 tabs Day 2, 4 tabs Day 3, 3 tabs Day 4, 2 tabs Day 5, 1 tab Day 6.    Dispense:  21 tablet    Refill:  0  . predniSONE (DELTASONE) 10 MG tablet    Sig: Take 6 tabs with breakfast Day 1, 5 tabs Day 2, 4 tabs Day 3, 3 tabs Day 4, 2 tabs Day 5, 1 tab Day 6.    Dispense:  21 tablet    Refill:  0    Please print instructions in Spanish, this is duplicate rx.      Follow up plan: Return in about 2 weeks (around 06/10/2015) for Right leg pain, sciatica.  Saralyn Pilar, DO [facility], PGY-3

## 2015-05-28 NOTE — Assessment & Plan Note (Signed)
Suspect R-LE symptoms with some sciatica may be related to lumbar DJD with facet arthropathy (06/2013 x-ray), symptoms mostly in R thigh down to foot however. Improved on ibuprofen now symptoms returned. No red flag symptoms for LBP. Diff Dx: Concern possible neurogenic claudication (history not entirely suggestive, but without back pain this may be likely in eldelry patient), unlikely vascular claudication with good distal pulses also neuropathic pain less likely. Possible piriformis syndrome as considered before.  Plan: 1. Trial on prednisone dose pak 60 to 10 mg x 6 day taper, then may resume Ibuprofen PRN 2. Follow-up 2 weeks, if significantly improved then may be nerve impingement vs claudication from Lumbar DJD, would consider referral to Ortho vs trial PT. May ultimately need advanced imaging, epidural injections if facetogenic pain

## 2015-06-10 ENCOUNTER — Ambulatory Visit: Payer: Self-pay

## 2015-06-23 ENCOUNTER — Other Ambulatory Visit: Payer: Self-pay | Admitting: Family Medicine

## 2015-06-23 ENCOUNTER — Other Ambulatory Visit: Payer: Self-pay | Admitting: *Deleted

## 2015-06-23 DIAGNOSIS — E119 Type 2 diabetes mellitus without complications: Secondary | ICD-10-CM

## 2015-06-23 MED ORDER — METFORMIN HCL 1000 MG PO TABS: 1000.0000 mg | ORAL_TABLET | Freq: Two times a day (BID) | ORAL | Status: DC

## 2015-06-23 NOTE — Telephone Encounter (Signed)
Sent to pharmacy earlier today. Please inform patient.

## 2015-06-23 NOTE — Telephone Encounter (Signed)
Patient asks PCP refill for metformin 1000 mg. Please, follow up with Patient (Spanish).

## 2015-07-01 ENCOUNTER — Ambulatory Visit (INDEPENDENT_AMBULATORY_CARE_PROVIDER_SITE_OTHER): Payer: No Typology Code available for payment source | Admitting: Family Medicine

## 2015-07-01 ENCOUNTER — Ambulatory Visit: Payer: No Typology Code available for payment source | Admitting: Family Medicine

## 2015-07-01 ENCOUNTER — Encounter: Payer: Self-pay | Admitting: Family Medicine

## 2015-07-01 VITALS — BP 121/52 | HR 52 | Temp 98.2°F | Wt 152.1 lb

## 2015-07-01 DIAGNOSIS — G6289 Other specified polyneuropathies: Secondary | ICD-10-CM

## 2015-07-01 MED ORDER — GABAPENTIN 300 MG PO CAPS: 600.0000 mg | ORAL_CAPSULE | Freq: Three times a day (TID) | ORAL | Status: DC

## 2015-07-01 NOTE — Progress Notes (Signed)
Subjective: Carol Finley is a 65 y.o. female presenting for foot pain and numbness.   Spanish video interpretor, Naysha [phone], was used throughout encounter.   She reports a couple months of stable constant, waxing-waning, numbness and stabbing/burning pain in her feet, with the right much more affected than the left. She denies any symptoms involving the legs, knee, or hip. Her lower back has achy nonradiating pain sometimes when standing. She's had diabetes for ~15 years, not at goal A1c on insulin, and has been taking neurontin with incomplete relief  - ROS: As above - Non-smoker  Objective: BP 121/52 mmHg  Pulse 52  Temp(Src) 98.2 F (36.8 C) (Oral)  Wt 152 lb 1.6 oz (68.992 kg) Gen: Well-appearing 65 y.o. female in no distress Feet: DP/PT pulses 2/4, cap refill < 3 sec, monofilament exam wnl bilaterally. No edema, callus, ulcer. Full ROm with 5/5 strength.  Assessment/Plan: Carol Finley is a 65 y.o. female here for neuropathic-type foot pain in setting of longstanding diabetes.  - Will increase gabapentin dose (currently taking 600mg  total daily dose) to treat presumed diabetic neuropathy.

## 2015-07-01 NOTE — Patient Instructions (Addendum)
Gabapentin capsules or tablets Qu es este medicamento? La GABAPENTINA se utiliza para controlar las crisis parciales en adultos con epilepsia. Este medicamento tambin se [place] para tratar ciertos tipos de dolores crnicos relacionados con los nervios. Este medicamento puede ser utilizado para otros usos; si tiene alguna pregunta consulte con su proveedor de atencin mdica o con su farmacutico. Qu le debo informar a mi profesional de la salud antes de tomar este medicamento? Necesita saber si usted presenta alguno de los [employer] o situaciones: -enfermedad renal -ideas suicidas, planes o si usted o alguien de su familia ha intentado un suicidio previo -una reaccin alrgica o inusual a la gabapentina, otros medicamentos, alimentos, colorantes o conservadores -si est embarazada o buscando quedar embarazada -si est amamantando a un beb Cmo debo utilizar este medicamento? Tome este medicamento por va oral con un vaso de agua. Siga las instrucciones de la etiqueta del [place]. Puede tomarlo con o sin alimentos. Si este medicamento le produce Programme researcher, broadcasting/film/video, tmelo con alimentos. Tome su medicamento a intervalos regulares. No tome su medicamento con una frecuencia mayor a la indicada. No deje de tomarlo excepto si as lo indica su mdico. Si recibe instrucciones de partir las tabletas de 600 o de 800 mg por la [employer] de su dosis, debe tomar la mitad de la tableta adicional en su prxima dosis. Si no ha [employer] de la tableta adicional despus de [address], debe desecharla. Su farmacutico le dar una [place] del medicamento especial con cada receta y relleno. Asegrese de leer esta informacin cada vez cuidadosamente. Hable con su pediatra para informarse acerca del uso de este medicamento en nios. Puede requerir atencin especial. Sobredosis: Pngase en contacto inmediatamente con un centro toxicolgico o una sala de urgencia si usted cree que haya tomado  demasiado medicamento. ATENCIN: [employer] es solo para usted. No comparta este medicamento con nadie. Qu sucede si me olvido de una dosis? Si olvida una dosis, tmela lo antes posible. Si es casi la hora de la prxima dosis, tome slo esa dosis. No tome dosis adicionales o dobles. Qu puede interactuar con este medicamento? No tome esta medicina con ninguno de los siguientes medicamentos: -otros productos de gabapentina Esta medicina tambin puede interactuar con los siguientes medicamentos: -alcohol -anticidos -antihistamnicos para Environmental consultant, tos y resfros -ciertos medicamentos para la ansiedad o para dormir -ciertos medicamentos para la depresin o trastornos psicticos -homatropina; hidrocodona -naproxeno -medicamentos narcticos(opiceos)para el dolor -fenotiazinas, tales como clorpromacina, mesoridazina, proclorperazina, tioridazina Puede ser que esta lista no menciona todas las posibles interacciones. Informe a su profesional de [employer] de [employer] productos a base de hierbas, medicamentos de [place] o suplementos nutritivos que est tomando. Si usted fuma, consume bebidas alcohlicas o si utiliza drogas ilegales, indqueselo tambin a su profesional de [employer]. Algunas sustancias pueden interactuar con su medicamento. A qu debo estar atento al usar [employer]? Visite a su mdico o a su profesional de la salud para chequear su evolucin peridicamente. Tal vez desee mantener un registro personal en su hogar de cmo siente que su enfermedad responde al tratamiento. Puede compartir esta informacin con su mdico o con su profesional de la salud durante cada [place]. Debe comunicarse con su mdico o con su profesional de la salud si sus convulsiones empeoran o si experimenta un nuevo tipo de convulsiones. No deje de tomar este medicamento ni ninguno de sus medicamentos para las convulsiones a menos que as lo indique su mdico o su profesional de  la salud. Si  suspende repentinamente su medicamento, el nmero de convulsiones o su gravedad puede aumentar. Use una pulsera o cadena de identificacin mdica si toma este medicamento para tratar sus convulsiones. Lleve consigo una tarjeta de identificacin que indique todos sus medicamentos. Puede experimentar somnolencia, mareos o visin borrosa. No conduzca ni utilice maquinaria, ni haga nada que Scientist, research (life sciences) en estado de alerta hasta que sepa cmo le afecta este medicamento. Para reducir los Kellogg, no se siente ni se ponga de pie con rapidez, especialmente si es un paciente de edad avanzada. El alcohol puede aumentar la somnolencia y [place]. Evite consumir bebidas alcohlicas. Se le podr secar la boca. Masticar chicle si azcar, chupar caramelos duros y beber agua en abundancia le ayudar a mantener la boca hmeda. El uso de este medicamento puede aumentar la posibilidad de [place] Finlay ideas o comportamiento suicida. Presta atencin a como usted responde al medicamento mientras est usndolo. Informe a su profesional de la salud inmediatamente de cualquier empeoramiento de humor o ideas de suicidio o de morir. Las mujeres que se encuentran Database administrator usan este medicamento pueden inscribirse en el registro del [employer] American Antiepileptic Drug Pregnancy Registry (Registro estadounidense de Psychiatrist de Medicamentos Antiepilpticos) llamando al telfono [phone]. Este registro recoge informacin acerca de la seguridad del uso de medicamentos antiepilpticos durante el Psychiatrist. Qu efectos secundarios puedo tener al [employer] este medicamento? Efectos secundarios que debe informar a su mdico o a Producer, television/film/video de la salud tan pronto como sea posible: -Therapist, art como erupcin cutnea, picazn o urticarias, hinchazn de la cara, labios o lengua -empeoramiento de humor o ideas o actos de suicidio o de morir Efectos secundarios que, por lo general, no requieren atencin  mdica (debe informarlos a su mdico o a su profesional de la salud si persisten o si son molestos): -estreimiento -dificultad para caminar o controlar los movimientos musculares -mareos -nuseas -habla arrastrando las palabras -cansancio -temblores -aumento de peso Puede ser que esta lista no menciona todos los posibles efectos secundarios. Comunquese a su mdico por asesoramiento mdico [employer]lett[employer]. Usted puede informar los efectos secundarios a la FDA por telfono al 1-800-FDA-1088. Dnde debo guardar mi medicina? Mantngala fuera del alcance de los nios. Gurdela a [employer], entre 15 y 30 grados C (22 y 76 grados F). Deseche todo el medicamento que no haya utilizado, despus de la fecha de vencimiento. ATENCIN: Este folleto es un resumen. Puede ser que no cubra toda la posible informacin. Si usted tiene preguntas acerca de esta medicina, consulte con su mdico, su farmacutico o su profesional de Radiographer, therapeutic.    Increase gabapentin dose as follows:   - Start taking 1 tablet ( ) 3 times per day (you are currently only taking it twice a day). - In 3 days, add 1 tablet in the evening: This will mean you take 1 tablet in the morning, 1 at lunch, and 2 at bedtime.  - In 3 days, add 1 tablet at lunch: This will mean you take 1 tablet in the morning, 2 at lunch, and 2 at bedtime.  - In 3 days, add 1 tablet in the morning: This will mean you take 2 tablets in the morning, 2 at lunch, and 2 at bedtime.  - You will continue taking gabapentin on this schedule until you follow up with your PCP in about 2 - 4 weeks.

## 2015-07-25 ENCOUNTER — Other Ambulatory Visit: Payer: Self-pay | Admitting: *Deleted

## 2015-07-26 MED ORDER — GABAPENTIN 300 MG PO CAPS: 600.0000 mg | ORAL_CAPSULE | Freq: Three times a day (TID) | ORAL | Status: DC

## 2015-08-29 ENCOUNTER — Ambulatory Visit (INDEPENDENT_AMBULATORY_CARE_PROVIDER_SITE_OTHER): Payer: No Typology Code available for payment source | Admitting: Family Medicine

## 2015-08-29 ENCOUNTER — Encounter: Payer: Self-pay | Admitting: Family Medicine

## 2015-08-29 VITALS — BP 159/63 | HR 62 | Temp 98.3°F | Ht 61.5 in | Wt 154.0 lb

## 2015-08-29 DIAGNOSIS — M1990 Unspecified osteoarthritis, unspecified site: Secondary | ICD-10-CM | POA: Insufficient documentation

## 2015-08-29 DIAGNOSIS — E1165 Type 2 diabetes mellitus with hyperglycemia: Secondary | ICD-10-CM

## 2015-08-29 DIAGNOSIS — Z23 Encounter for immunization: Secondary | ICD-10-CM

## 2015-08-29 DIAGNOSIS — M159 Polyosteoarthritis, unspecified: Secondary | ICD-10-CM

## 2015-08-29 DIAGNOSIS — E118 Type 2 diabetes mellitus with unspecified complications: Secondary | ICD-10-CM

## 2015-08-29 DIAGNOSIS — E785 Hyperlipidemia, unspecified: Secondary | ICD-10-CM

## 2015-08-29 DIAGNOSIS — I10 Essential (primary) hypertension: Secondary | ICD-10-CM

## 2015-08-29 DIAGNOSIS — G629 Polyneuropathy, unspecified: Secondary | ICD-10-CM

## 2015-08-29 DIAGNOSIS — B351 Tinea unguium: Secondary | ICD-10-CM

## 2015-08-29 LAB — TSH: TSH: 3.01 m[IU]/L

## 2015-08-29 LAB — HEPATIC FUNCTION PANEL
ALK PHOS: 90 U/L (ref 25–125)
ALT: 15 U/L (ref 7–35)
AST: 24 U/L (ref 13–35)
BILIRUBIN, TOTAL: 0.5 mg/dL

## 2015-08-29 LAB — COMPLETE METABOLIC PANEL WITH GFR
ALBUMIN: 3.8 g/dL (ref 3.6–5.1)
ALK PHOS: 90 U/L (ref 33–130)
ALT: 15 U/L (ref 6–29)
AST: 24 U/L (ref 10–35)
BILIRUBIN TOTAL: 0.5 mg/dL (ref 0.2–1.2)
BUN: 15 mg/dL (ref 7–25)
CO2: 28 mmol/L (ref 20–31)
Calcium: 8.9 mg/dL (ref 8.6–10.4)
Chloride: 103 mmol/L (ref 98–110)
Creat: 0.64 mg/dL (ref 0.50–0.99)
GLUCOSE: 75 mg/dL (ref 65–99)
POTASSIUM: 4.1 mmol/L (ref 3.5–5.3)
SODIUM: 138 mmol/L (ref 135–146)
TOTAL PROTEIN: 6.9 g/dL (ref 6.1–8.1)

## 2015-08-29 LAB — BASIC METABOLIC PANEL
BUN: 15 mg/dL (ref 4–21)
Creatinine: 0.6 mg/dL (ref 0.5–1.1)
GLUCOSE: 75 mg/dL
Potassium: 4.1 mmol/L (ref 3.4–5.3)
Sodium: 138 mmol/L (ref 137–147)

## 2015-08-29 LAB — POCT GLYCOSYLATED HEMOGLOBIN (HGB A1C): HEMOGLOBIN A1C: 7.3

## 2015-08-29 MED ORDER — ATORVASTATIN CALCIUM 40 MG PO TABS: 40.0000 mg | ORAL_TABLET | Freq: Every day | ORAL | Status: DC

## 2015-08-29 MED ORDER — NAPROXEN 500 MG PO TABS: 500.0000 mg | ORAL_TABLET | Freq: Two times a day (BID) | ORAL | Status: DC | PRN

## 2015-08-29 NOTE — Addendum Note (Signed)
Addended by: Joycelyn Man RUMPLE, APRIL D on: 08/29/2015 11:09 AM   Modules accepted: Orders, SmartSet

## 2015-08-29 NOTE — Assessment & Plan Note (Signed)
Pt reports ongoing right foot numbness/tingling but pain improved on gabapentin. Suspect this is diabetic neuropathy vs sciatica.  - check TSH (has been somewhat elevated in the past) - continue gabapentin - counseled on foot care and precautions

## 2015-08-29 NOTE — Assessment & Plan Note (Signed)
Refilled lipitor 

## 2015-08-29 NOTE — Assessment & Plan Note (Signed)
Much improved following 3 months of terbinafine, still some thickening on distal part of great toes - check CMP to eval LFTs following terb - hold off on further treatment to allow healthy nail to grow out, can consider burst treatment in the future if needed and LFTs still normal

## 2015-08-29 NOTE — Assessment & Plan Note (Addendum)
A1c improved to 7.3 today, fasting glucose 70-140, usually 90-110. Taking 15-20 units bid depending on am glucose. - continue current regimen - f/u in 3 months - foot exam done today

## 2015-08-29 NOTE — Assessment & Plan Note (Signed)
Elevated today but pt has not yet taken her bp meds, usually controlled - continue current regimen - take all bp meds before next f/u so we can better assess control

## 2015-08-29 NOTE — Progress Notes (Signed)
Subjective:   Carol Finley is a 65 y.o. female with a history of DM, HTN, recent foot pain and numbness here for f/u of same  CHRONIC DIABETES  Disease Monitoring  Blood Sugar Ranges: 70-140 fasting (usually 90-110), does not check postprandial  Polyuria: no   Visual problems: yes, unchanged   Medication Compliance: yes  Medication Side Effects  Hypoglycemia: no, nothing below 70   Preventitive Health Care  Eye Exam: due, pt to schedule when able to afford  Foot Exam: done today  Diet pattern: balanced, tid  Exercise: minimal, walking  Onychomycosis: Pt has been on terbinafine for 3 months and her nails look much better and don't hurt or itch like they used to. Denies any side effects from meds.  CHRONIC HYPERTENSION  Disease Monitoring  Blood pressure range: 120-160/60-80  Chest pain: no   Dyspnea: no   Claudication: no   Medication compliance: yes  Medication Side Effects  Lightheadedness: no   Urinary frequency: no   Edema: no   Impotence: no   Preventitive Healthcare:  Exercise: no, nothing regular, tried to walk   Salt Restriction: sometimes  Neuropathy: Pt has been seen several times over the past few months for pain and numbness in her right foot. Today she reports that the pain has improved with gabapentin but the numbness and "asleep" feeling has not changed at all. She now reports a similar feeling in her hands occasionally that is not as severe.  Review of Systems:  Per HPI. All other systems reviewed and are negative.   PMH, PSH, Medications, Allergies, and FmHx reviewed and updated in EMR.  Social History: never smoker  Objective:  BP 159/63 mmHg  Pulse 62  Temp(Src) 98.3 F (36.8 C) (Oral)  Ht 5' 1.5" (1.562 m)  Wt 154 lb (69.854 kg)  BMI 28.63 kg/m2  Gen:  65 y.o. female in NAD HEENT: NCAT, MMM, anicteric sclerae CV: RRR, no MRG Resp: Non-labored, CTAB, no wheezes noted Abd: Soft, NTND, BS present, no guarding or  organomegaly Ext: WWP, no edema Neuro: Alert and oriented, speech normal, normal strength in all 4 extremities, decreased sensation to light touch in right foot      Chemistry      Component Value Date/Time   NA 138 04/16/2015 1039   K 4.3 04/16/2015 1039   CL 100 04/16/2015 1039   CO2 28 04/16/2015 1039   BUN 16 04/16/2015 1039   CREATININE 0.60 04/16/2015 1039   CREATININE 0.68 11/12/2014 0558      Component Value Date/Time   CALCIUM 9.2 04/16/2015 1039   ALKPHOS 99 04/16/2015 1039   AST 24 04/16/2015 1039   ALT 16 04/16/2015 1039   BILITOT 0.4 04/16/2015 1039      Lab Results  Component Value Date   WBC 6.7 11/11/2014   HGB 12.9 11/11/2014   HCT 39.1 11/11/2014   MCV 83.2 11/11/2014   PLT 322 11/11/2014   Lab Results  Component Value Date   TSH 4.402 11/11/2014   Lab Results  Component Value Date   HGBA1C 7.3 08/29/2015   Assessment & Plan:     Carol Finley is a 65 y.o. female here for dm f/u  Diabetes type 2, uncontrolled A1c improved to 7.3 today, fasting glucose 70-140, usually 90-110. Taking 15-20 units bid depending on am glucose. - continue current regimen - f/u in 3 months - foot exam done today  ONYCHOMYCOSIS, BILATERAL Much improved following 3 months of terbinafine, still some thickening on  distal part of great toes - check CMP to eval LFTs following terb - hold off on further treatment to allow healthy nail to grow out, can consider burst treatment in the future if needed and LFTs still normal  Osteoarthritis Occasional aches in low back, knees, responds well to rare naproxen use - refilled naproxen, cautioned against frequent use  Hyperlipidemia Refilled lipitor  Essential hypertension, benign Elevated today but pt has not yet taken her bp meds, usually controlled - continue current regimen - take all bp meds before next f/u so we can better assess control  Neuropathy (HCC) Pt reports ongoing right foot  numbness/tingling but pain improved on gabapentin. Suspect this is diabetic neuropathy vs sciatica.  - check TSH (has been somewhat elevated in the past) - continue gabapentin - counseled on foot care and precautions       Beverely Low, MD, MPH [facility] PGY-3 08/29/2015 10:02 AM

## 2015-08-29 NOTE — Assessment & Plan Note (Signed)
Occasional aches in low back, knees, responds well to rare naproxen use - refilled naproxen, cautioned against frequent use

## 2015-09-03 ENCOUNTER — Telehealth: Payer: Self-pay | Admitting: Family Medicine

## 2015-09-03 DIAGNOSIS — M159 Polyosteoarthritis, unspecified: Secondary | ICD-10-CM

## 2015-09-03 MED ORDER — NAPROXEN 500 MG PO TABS: 500.0000 mg | ORAL_TABLET | Freq: Two times a day (BID) | ORAL | Status: DC | PRN

## 2015-09-03 NOTE — Telephone Encounter (Signed)
Patient asks PCP refill for Naproxen 500 mg ASAP.  Please, follow up with Patient (Spanish).

## 2015-09-03 NOTE — Telephone Encounter (Signed)
It was sent in Friday but I just resent it. Thanks!

## 2015-09-15 ENCOUNTER — Encounter: Payer: Self-pay | Admitting: Family Medicine

## 2015-09-24 ENCOUNTER — Other Ambulatory Visit: Payer: Self-pay | Admitting: *Deleted

## 2015-09-24 DIAGNOSIS — I10 Essential (primary) hypertension: Secondary | ICD-10-CM

## 2015-09-24 MED ORDER — ATORVASTATIN CALCIUM 40 MG PO TABS: 40.0000 mg | ORAL_TABLET | Freq: Every day | ORAL | Status: DC

## 2015-10-02 ENCOUNTER — Other Ambulatory Visit: Payer: Self-pay | Admitting: *Deleted

## 2015-10-02 DIAGNOSIS — E119 Type 2 diabetes mellitus without complications: Secondary | ICD-10-CM

## 2015-10-02 DIAGNOSIS — Z794 Long term (current) use of insulin: Principal | ICD-10-CM

## 2015-10-03 MED ORDER — INSULIN NPH (HUMAN) (ISOPHANE) 100 UNIT/ML ~~LOC~~ SUSP: 17.0000 [IU] | Freq: Two times a day (BID) | SUBCUTANEOUS | Status: DC

## 2015-11-25 ENCOUNTER — Other Ambulatory Visit: Payer: Self-pay | Admitting: *Deleted

## 2015-11-25 DIAGNOSIS — I10 Essential (primary) hypertension: Secondary | ICD-10-CM

## 2015-11-25 MED ORDER — FUROSEMIDE 20 MG PO TABS: 20.0000 mg | ORAL_TABLET | Freq: Every day | ORAL | 11 refills | Status: DC

## 2015-11-25 MED ORDER — LISINOPRIL 40 MG PO TABS: 40.0000 mg | ORAL_TABLET | Freq: Every day | ORAL | 11 refills | Status: DC

## 2016-01-29 ENCOUNTER — Encounter: Payer: Self-pay | Admitting: Family Medicine

## 2016-01-29 ENCOUNTER — Ambulatory Visit (INDEPENDENT_AMBULATORY_CARE_PROVIDER_SITE_OTHER): Payer: Self-pay | Admitting: Family Medicine

## 2016-01-29 VITALS — BP 136/57 | HR 68 | Temp 98.0°F | Ht 62.0 in | Wt 155.2 lb

## 2016-01-29 DIAGNOSIS — K0401 Reversible pulpitis: Secondary | ICD-10-CM | POA: Insufficient documentation

## 2016-01-29 DIAGNOSIS — E118 Type 2 diabetes mellitus with unspecified complications: Secondary | ICD-10-CM

## 2016-01-29 DIAGNOSIS — E1165 Type 2 diabetes mellitus with hyperglycemia: Secondary | ICD-10-CM

## 2016-01-29 LAB — POCT GLYCOSYLATED HEMOGLOBIN (HGB A1C): HEMOGLOBIN A1C: 7.4

## 2016-01-29 MED ORDER — AMOXICILLIN-POT CLAVULANATE 875-125 MG PO TABS: 1.0000 | ORAL_TABLET | Freq: Two times a day (BID) | ORAL | 0 refills | Status: DC

## 2016-01-29 NOTE — Patient Instructions (Addendum)
It was a pleasure seeing you today in our clinic. Today we discussed your dental pain. Here is the treatment plan we have discussed and agreed upon together:   - I've prescribed to Augmentin. Take 1 tablet twice a day over the next 10 days. - I would like for you to set up an appointment with the dentist as soon as possible. I provided you information on various offices around here. Please contact one of these can set up an appointment. - If you develop any fever or chills do not hesitate to report to the nearest ED. - Antibiotics I have provided you today will not solve this problem and you need to make sure to get to the dentist before this issue gets any worse.

## 2016-01-29 NOTE — Progress Notes (Signed)
   HPI  CC: Dental pain Patient is here with reports of dental pain. She states that pain began 2 weeks ago and has gradually worsened. She has tried over-the-counter pain relievers but this has not helped. She states that in the past she has lost teeth due to infection and she thinks that this might be what's going on now. She denies any headache, fevers, chills, nausea, vomiting, diarrhea. She endorses anorexia due to significant pain while eating.  Review of Systems   See HPI for ROS. All other systems reviewed and are negative.  CC, SH/smoking status, and VS noted  Objective: BP (!) 136/57 (BP Location: Left Arm, Patient Position: Sitting, Cuff Size: Normal)   Pulse 68   Temp 98 F (36.7 C) (Oral)   Ht 5\' 2"  (1.575 m)   Wt 155 lb 3.2 oz (70.4 kg)   BMI 28.39 kg/m  Gen: NAD, alert, cooperative, and pleasant. HEENT: NCAT, EOMI, PERRL, extremely poor dentition, gingival erythema and swelling on the right lower jaw, multiple teeth missing, MMM, no LAD CV: RRR, no murmur Resp: CTAB, no wheezes, non-labored Neuro: Alert and oriented, Speech clear, No gross deficits  Assessment and plan:  Acute pulpitis Patient is here with a 2 week history of dental pain. Signs and symptoms are consistent with dental caries/abscess/pulpitis. - 10 days of Augmentin twice a day provided. - Patient advised to see dentist. Significant amount of information was provided for patient to contact and set up an appointment.   Orders Placed This Encounter  Procedures  . HgB A1c    Meds ordered this encounter  Medications  . amoxicillin-clavulanate (AUGMENTIN) 875-125 MG tablet    Sig: Take 1 tablet by mouth 2 (two) times daily.    Dispense:  20 tablet    Refill:  0     Kathee Delton, MD,MS,  PGY3 01/29/2016 7:28 PM

## 2016-01-29 NOTE — Assessment & Plan Note (Signed)
Patient is here with a 2 week history of dental pain. Signs and symptoms are consistent with dental caries/abscess/pulpitis. - 10 days of Augmentin twice a day provided. - Patient advised to see dentist. Significant amount of information was provided for patient to contact and set up an appointment.

## 2016-02-10 ENCOUNTER — Ambulatory Visit: Payer: No Typology Code available for payment source | Attending: Internal Medicine

## 2016-03-22 ENCOUNTER — Other Ambulatory Visit: Payer: Self-pay | Admitting: *Deleted

## 2016-03-22 DIAGNOSIS — I10 Essential (primary) hypertension: Secondary | ICD-10-CM

## 2016-03-22 MED ORDER — NIFEDIPINE ER OSMOTIC RELEASE 60 MG PO TB24: 60.0000 mg | ORAL_TABLET | Freq: Every day | ORAL | 3 refills | Status: DC

## 2016-04-22 ENCOUNTER — Ambulatory Visit: Payer: No Typology Code available for payment source | Admitting: Family Medicine

## 2016-04-27 ENCOUNTER — Other Ambulatory Visit: Payer: Self-pay | Admitting: *Deleted

## 2016-04-27 DIAGNOSIS — I10 Essential (primary) hypertension: Secondary | ICD-10-CM

## 2016-04-29 MED ORDER — CLONIDINE HCL 0.3 MG PO TABS: 0.3000 mg | ORAL_TABLET | Freq: Three times a day (TID) | ORAL | 11 refills | Status: DC

## 2016-05-10 ENCOUNTER — Encounter: Payer: Self-pay | Admitting: Family Medicine

## 2016-05-10 ENCOUNTER — Ambulatory Visit: Payer: No Typology Code available for payment source | Attending: Family Medicine | Admitting: Family Medicine

## 2016-05-10 VITALS — BP 139/62 | HR 72 | Temp 98.4°F | Ht 61.0 in | Wt 156.4 lb

## 2016-05-10 DIAGNOSIS — J322 Chronic ethmoidal sinusitis: Secondary | ICD-10-CM | POA: Insufficient documentation

## 2016-05-10 DIAGNOSIS — E1100 Type 2 diabetes mellitus with hyperosmolarity without nonketotic hyperglycemic-hyperosmolar coma (NKHHC): Secondary | ICD-10-CM | POA: Insufficient documentation

## 2016-05-10 DIAGNOSIS — G4700 Insomnia, unspecified: Secondary | ICD-10-CM | POA: Insufficient documentation

## 2016-05-10 DIAGNOSIS — G4709 Other insomnia: Secondary | ICD-10-CM

## 2016-05-10 DIAGNOSIS — I10 Essential (primary) hypertension: Secondary | ICD-10-CM

## 2016-05-10 DIAGNOSIS — G629 Polyneuropathy, unspecified: Secondary | ICD-10-CM

## 2016-05-10 DIAGNOSIS — J0120 Acute ethmoidal sinusitis, unspecified: Secondary | ICD-10-CM

## 2016-05-10 LAB — COMPLETE METABOLIC PANEL WITH GFR
ALT: 11 U/L (ref 6–29)
AST: 19 U/L (ref 10–35)
Albumin: 3.9 g/dL (ref 3.6–5.1)
Alkaline Phosphatase: 94 U/L (ref 33–130)
BUN: 14 mg/dL (ref 7–25)
CHLORIDE: 104 mmol/L (ref 98–110)
CO2: 28 mmol/L (ref 20–31)
Calcium: 9.3 mg/dL (ref 8.6–10.4)
Creat: 0.69 mg/dL (ref 0.50–0.99)
GFR, Est African American: >89 mL/min (ref >=60)
GLUCOSE: 126 mg/dL — AB (ref 65–99)
POTASSIUM: 4.2 mmol/L (ref 3.5–5.3)
SODIUM: 141 mmol/L (ref 135–146)
Total Bilirubin: 0.6 mg/dL (ref 0.2–1.2)
Total Protein: 7.7 g/dL (ref 6.1–8.1)

## 2016-05-10 LAB — POCT GLYCOSYLATED HEMOGLOBIN (HGB A1C): HEMOGLOBIN A1C: 7.6

## 2016-05-10 LAB — LIPID PANEL W/REFLEX DIRECT LDL
CHOL/HDL RATIO: 2.5 ratio (ref <5.0)
Cholesterol: 105 mg/dL (ref <200)
HDL: 42 mg/dL — AB (ref >50)
LDL-CHOLESTEROL: 46 mg/dL
Non-HDL Cholesterol (Calc): 63 mg/dL (ref <130)
TRIGLYCERIDES: 86 mg/dL (ref <150)

## 2016-05-10 LAB — GLUCOSE, POCT (MANUAL RESULT ENTRY): POC Glucose: 136 mg/dl — AB (ref 70–99)

## 2016-05-10 MED ORDER — AMOXICILLIN 500 MG PO CAPS: 500.0000 mg | ORAL_CAPSULE | Freq: Three times a day (TID) | ORAL | 0 refills | Status: DC

## 2016-05-10 MED ORDER — TRAZODONE HCL 50 MG PO TABS: 50.0000 mg | ORAL_TABLET | Freq: Every evening | ORAL | 3 refills | Status: DC | PRN

## 2016-05-10 NOTE — Progress Notes (Signed)
Subjective:  Patient ID: Carol Finley, female    DOB: [birth year]  Age: 66 y.o. MRN: [id number]  CC: Hypertension; Diabetes; URI (fever last night); and Insomnia   HPI Carol Finley is a 66 year old female with a history of type 2 diabetes mellitus (A1c 7.6), diabetic neuropathy, hypertension, hyperlipidemia who was previously followed at the family practice clinic and comes in today to establish care here.  Endorses compliance with all her medications and does not need refills at this time.  She has had a one-week history of cough productive of whitish sputum, myalgias, nasal congestion, subjective fever yesterday. Denies sick contacts.  She complains of insomnia and would like to have something for this.  Past Medical History:  Diagnosis Date  . BACTERIAL PNEUMONIA 10/09/2009   Qualifier: Diagnosis of  By: Huntley Dec, Scott    . Diabetes mellitus without complication (HCC)   . Hypertension     History reviewed. No pertinent surgical history.  No Known Allergies   Outpatient Medications Prior to Visit  Medication Sig Dispense Refill  . aspirin 81 MG tablet Take 1 tablet (81 mg total) by mouth daily. 30 tablet 5  . atorvastatin (LIPITOR) 40 MG tablet Take 1 tablet (40 mg total) by mouth daily. 90 tablet 3  . cloNIDine (CATAPRES) 0.3 MG tablet Take 1 tablet (0.3 mg total) by mouth 3 (three) times daily. 90 tablet 11  . furosemide (LASIX) 20 MG tablet Take 1 tablet (20 mg total) by mouth daily. 30 tablet 11  . gabapentin (NEURONTIN) 300 MG capsule Take 2 capsules (600 mg total) by mouth 3 (three) times daily. 180 capsule 11  . glucose blood test strip Use as instructed 100 each 12  . insulin NPH Human (HUMULIN N) 100 UNIT/ML injection Inject 0.17 mLs (17 Units total) into the skin 2 (two) times daily before a meal. 1 vial 12  . lisinopril (PRINIVIL,ZESTRIL) 40 MG tablet Take 1 tablet (40 mg total) by mouth daily. 30 tablet 11  . metFORMIN (GLUCOPHAGE)  1000 MG tablet Take 1 tablet (1,000 mg total) by mouth 2 (two) times daily with a meal. 60 tablet 11  . NIFEdipine (PROCARDIA XL/ADALAT-CC) 60 MG 24 hr tablet Take 1 tablet (60 mg total) by mouth daily. 90 tablet 3  . loratadine (CLARITIN) 10 MG tablet Take 1 tablet (10 mg total) by mouth daily. (Patient not taking: Reported on 05/10/2016) 30 tablet 11  . naproxen (NAPROSYN) 500 MG tablet Take 1 tablet (500 mg total) by mouth 2 (two) times daily as needed. (Patient not taking: Reported on 05/10/2016) 60 tablet 1  . amoxicillin-clavulanate (AUGMENTIN) 875-125 MG tablet Take 1 tablet by mouth 2 (two) times daily. 20 tablet 0   No facility-administered medications prior to visit.     ROS Review of Systems  Constitutional: Negative for activity change, appetite change and fatigue.  HENT:       See hpi  Eyes: Negative for visual disturbance.  Respiratory: Positive for cough. Negative for chest tightness, shortness of breath and wheezing.   Cardiovascular: Negative for chest pain and palpitations.  Gastrointestinal: Negative for abdominal distention, abdominal pain and constipation.  Endocrine: Negative for polydipsia.  Genitourinary: Negative for dysuria and frequency.  Musculoskeletal: Negative for arthralgias and back pain.  Skin: Negative for rash.  Neurological: Negative for tremors, light-headedness and numbness.  Hematological: Does not bruise/bleed easily.  Psychiatric/Behavioral: Positive for sleep disturbance. Negative for agitation and behavioral problems.    Objective:  BP 139/62 (BP Location: Right Arm, Patient  Position: Sitting, Cuff Size: Small)   Pulse 72   Temp 98.4 F (36.9 C) (Oral)   Ht 5\' 1"  (1.549 m)   Wt 156 lb 6.4 oz (70.9 kg)   SpO2 96%   BMI 29.55 kg/m   BP/Weight 05/10/2016 01/29/2016 08/29/2015  Systolic BP 139 136 159  Diastolic BP 62 57 63  Wt. (Lbs) 156.4 155.2 154  BMI 29.55 28.39 28.63      Physical Exam  Constitutional: She is oriented to person,  place, and time. She appears well-developed and well-nourished.  HENT:  Right Ear: External ear normal.  Left Ear: External ear normal.  Mouth/Throat: Oropharynx is clear and moist.  Frontal sinus tenderness  Cardiovascular: Normal rate, normal heart sounds and intact distal pulses.   No murmur heard. Pulmonary/Chest: Effort normal and breath sounds normal. She has no wheezes. She has no rales. She exhibits no tenderness.  Abdominal: Soft. Bowel sounds are normal. She exhibits no distension and no mass. There is no tenderness.  Musculoskeletal: Normal range of motion.  Neurological: She is alert and oriented to person, place, and time.    Lab Results  Component Value Date   HGBA1C 7.6 05/10/2016    CMP Latest Ref Rng & Units 08/29/2015 08/29/2015 04/16/2015  Glucose 65 - 99 mg/dL 75 - 740(C)  BUN 7 - 25 mg/dL 15 15 16   Creatinine 0.50 - 0.99 mg/dL 1.44 0.6 8.18  Sodium 563 - 146 mmol/L 138 138 138  Potassium 3.5 - 5.3 mmol/L 4.1 4.1 4.3  Chloride 98 - 110 mmol/L 103 - 100  CO2 20 - 31 mmol/L 28 - 28  Calcium 8.6 - 10.4 mg/dL 8.9 - 9.2  Total Protein 6.1 - 8.1 g/dL 6.9 - 7.1  Total Bilirubin 0.2 - 1.2 mg/dL 0.5 - 0.4  Alkaline Phos 33 - 130 U/L 90 90 99  AST 10 - 35 U/L 24 24 24   ALT 6 - 29 U/L 15 15 16      Assessment & Plan:   1. Uncontrolled type 2 diabetes mellitus with hyperosmolarity without coma, without long-term current use of insulin (HCC) A1c of 7.6 Not fully optimized Diabetic diet Will adjust regimen at next visit if not at goal. - Glucose (CBG) - HgB A1c - COMPLETE METABOLIC PANEL WITH GFR - Lipid Panel w/reflex Direct LDL - Microalbumin / creatinine urine ratio  2. Essential hypertension, benign Controlled  3. Other insomnia - traZODone (DESYREL) 50 MG tablet; Take 1 tablet (50 mg total) by mouth at bedtime as needed for sleep.  Dispense: 30 tablet; Refill: 3  4. Acute non-recurrent ethmoidal sinusitis Increase fluid intake, use analgesics for  myalgias - amoxicillin (AMOXIL) 500 MG capsule; Take 1 capsule (500 mg total) by mouth 3 (three) times daily.  Dispense: 30 capsule; Refill: 0  5. Neuropathy (HCC) Continue gabapentin   Meds ordered this encounter  Medications  . traZODone (DESYREL) 50 MG tablet    Sig: Take 1 tablet (50 mg total) by mouth at bedtime as needed for sleep.    Dispense:  30 tablet    Refill:  3  . amoxicillin (AMOXIL) 500 MG capsule    Sig: Take 1 capsule (500 mg total) by mouth 3 (three) times daily.    Dispense:  30 capsule    Refill:  0    Follow-up: Return in about 3 months (around 08/07/2016) for Follow-up on diabetes mellitus.   Jaclyn Shaggy MD

## 2016-05-11 LAB — MICROALBUMIN / CREATININE URINE RATIO
CREATININE, URINE: 156 mg/dL (ref 20–320)
Microalb Creat Ratio: 263 mcg/mg creat — ABNORMAL HIGH (ref <30)
Microalb, Ur: 41.1 mg/dL

## 2016-05-18 ENCOUNTER — Telehealth: Payer: Self-pay | Admitting: Family Medicine

## 2016-05-18 DIAGNOSIS — K0889 Other specified disorders of teeth and supporting structures: Secondary | ICD-10-CM

## 2016-05-18 NOTE — Telephone Encounter (Signed)
Patient is calling to inquire about getting a referral for dentist. States that she forgot to ask for one during her visit on the 5th. Please f/u

## 2016-05-28 NOTE — Telephone Encounter (Signed)
Done

## 2016-06-01 ENCOUNTER — Telehealth: Payer: Self-pay

## 2016-06-01 NOTE — Telephone Encounter (Signed)
-----   Message from [place], [place] sent at 06/01/2016  2:20 PM EST ----- Please inform patient of cholesterol being normal. Patient needs to be aware of microalbuminuria being elevated which can show early signs of her diabetes affecting her kidneys. Please encourage patient to adhere to Diabetic regimen.

## 2016-06-01 NOTE — Telephone Encounter (Signed)
CMA call to go over lab results  Patient did not answer but CMA left a VM stating the results and if have any question just to call back

## 2016-06-04 ENCOUNTER — Ambulatory Visit: Payer: Self-pay | Attending: Family Medicine

## 2016-06-29 ENCOUNTER — Telehealth: Payer: Self-pay | Admitting: Family Medicine

## 2016-06-29 DIAGNOSIS — E119 Type 2 diabetes mellitus without complications: Secondary | ICD-10-CM

## 2016-06-29 DIAGNOSIS — I10 Essential (primary) hypertension: Secondary | ICD-10-CM

## 2016-06-29 MED ORDER — ATORVASTATIN CALCIUM 40 MG PO TABS: 40.0000 mg | ORAL_TABLET | Freq: Every day | ORAL | 3 refills | Status: DC

## 2016-06-29 MED ORDER — METFORMIN HCL 1000 MG PO TABS: 1000.0000 mg | ORAL_TABLET | Freq: Two times a day (BID) | ORAL | 11 refills | Status: DC

## 2016-06-29 NOTE — Telephone Encounter (Signed)
Requested medications faxed to [facility] Department

## 2016-06-29 NOTE — Telephone Encounter (Signed)
Pt. Called requesting a refill on the following medications:  atorvastatin (LIPITOR) 40 MG tablet   metFORMIN (GLUCOPHAGE) 1000 MG tablet   Pt. Would like Rx sent to [facility] Department. Pt. States she has been out of medication for about a week.  Please f/u with pt.

## 2016-07-13 ENCOUNTER — Encounter: Payer: Self-pay | Admitting: Family Medicine

## 2016-07-13 ENCOUNTER — Ambulatory Visit (INDEPENDENT_AMBULATORY_CARE_PROVIDER_SITE_OTHER): Payer: Self-pay | Admitting: Family Medicine

## 2016-07-13 DIAGNOSIS — R040 Epistaxis: Secondary | ICD-10-CM

## 2016-07-13 MED ORDER — SALINE SPRAY 0.65 % NA SOLN: 1.0000 | NASAL | 0 refills | Status: DC | PRN

## 2016-07-13 MED ORDER — OXYMETAZOLINE HCL 0.05 % NA SOLN: 1.0000 | Freq: Two times a day (BID) | NASAL | 0 refills | Status: DC

## 2016-07-13 NOTE — Patient Instructions (Signed)
Start the ocean spray.  Use the afrin as needed.  Take care,  Dr Jimmey Ralph

## 2016-07-13 NOTE — Assessment & Plan Note (Signed)
No anatomic abnormalities seen on exam. Recommended conservative management with frequent use of nasal saline. Also gave a prescription for afrin to use as needed to help with packing or holding pressure in the case that the nosebleeds return. Return precautions reviewed. Follow up as needed.

## 2016-07-13 NOTE — Progress Notes (Signed)
   Subjective:  Carol Finley is a 66 y.o. female who presents to the [facility] today with a chief complaint of nosebleed.   HPI:  Nosebleeds Symptoms started about 2 days ago while at church where she had a 30 minute episode of nosebleeds that she was able to stop with pressure. Did well until yesterday when the same thing happened. No rhinorrhea. No cough. No fevers or chills. She has not tried any treatments for this. She does not use any nose sprays.   ROS: Per HPI  Objective:  Physical Exam: BP 138/70   Pulse 73   Temp 97.7 F (36.5 C) (Oral)   Wt 162 lb (73.5 kg)   SpO2 98%   BMI 30.61 kg/m   Gen: NAD, resting comfortably HEENT: Normal pale pink nasal mucosa without fissures or signs of bleeding. MSK: no edema, cyanosis, or clubbing noted Skin: warm, dry Neuro: grossly normal, moves all extremities Psych: Normal affect and thought content  Assessment/Plan:  Epistaxis No anatomic abnormalities seen on exam. Recommended conservative management with frequent use of nasal saline. Also gave a prescription for afrin to use as needed to help with packing or holding pressure in the case that the nosebleeds return. Return precautions reviewed. Follow up as needed.   Katina Degree. Jimmey Ralph, MD [facility] Family Medicine Resident PGY-3 07/13/2016 10:39 AM

## 2016-07-28 ENCOUNTER — Encounter: Payer: Self-pay | Admitting: Family Medicine

## 2016-07-28 ENCOUNTER — Ambulatory Visit (INDEPENDENT_AMBULATORY_CARE_PROVIDER_SITE_OTHER): Payer: Self-pay | Admitting: Family Medicine

## 2016-07-28 VITALS — BP 126/64 | HR 79 | Temp 98.9°F | Ht 61.0 in | Wt 164.0 lb

## 2016-07-28 DIAGNOSIS — R197 Diarrhea, unspecified: Secondary | ICD-10-CM

## 2016-07-28 NOTE — Patient Instructions (Signed)
We will check blood work and stool studies.  Come back in 1-2 weeks if your symptoms are not improving.  Take care,  Dr Jimmey Ralph     Hemorragia subconjuntival (Subconjunctival Hemorrhage) La hemorragia subconjuntival es el sangrado que se produce entre la parte blanca del ojo (esclertica) y la membrana transparente que recubre la parte externa de este rgano (conjuntiva). Cerca de la superficie del ojo hay muchos vasos sanguneos diminutos. La hemorragia subconjuntival ocurre cuando uno o ms de estos vasos sanguneos se rompen y Water quality scientist, lo que deriva en la aparicin de una mancha roja en el ojo. Esta es similar a un hematoma. En funcin de la magnitud del sangrado, la mancha roja puede cubrir nicamente una pequea zona del ojo o la totalidad de la parte visible de Leisure centre manager. Si se acumula mucha sangre debajo de la conjuntiva, tambin puede haber inflamacin. Las hemorragias subconjuntivales no afectan la visin ni Teaching laboratory technician, pero puede haber sensacin de irritacin ocular si hay inflamacin. Generalmente, las hemorragias subconjuntivales no requieren tratamiento y Dance movement psychotherapist en el trmino de [employer]. CAUSAS Esta afeccin puede ser causada por lo siguiente:  Un traumatismo leve, como frotarse los ojos con mucha fuerza.  Un traumatismo grave o una contusin.  Toser, estornudar o vomitar.  Realizar esfuerzos, como ocurre al levantar un objeto pesado.  Hipertensin arterial.  Neomia Dear ciruga ocular reciente.  Antecedentes de diabetes.  Algunos medicamentos, especialmente los anticoagulantes.  Otras afecciones, como los tumores en los ojos, los trastornos hemorrgicos o las anomalas de los vasos sanguneos. Las hemorragias subconjuntivales pueden producirse sin una causa aparente. SNTOMAS Los sntomas de esta afeccin incluyen lo siguiente:  Cyndia Diver de color rojo oscuro o brillante en la parte blanca del ojo.  La zona enrojecida se puede extender hasta cubrir  un rea ms grande del ojo antes de Geneticist, molecular.  La zona enrojecida puede tornarse de color marrn amarillento antes de desaparecer.  Hinchazn.  Irritacin leve del ojo. DIAGNSTICO Esta afeccin se diagnostica mediante un examen fsico. Si la hemorragia subconjuntival fue causada por un traumatismo, el mdico puede derivarlo a un oculista (oftalmlogo) o a Dietitian para que lo examinen en busca de otras lesiones. Pueden hacerle otros estudios, por ejemplo:  Un examen ocular.  Un control de la presin arterial.  Anlisis de sangre para detectar la presencia de trastornos hemorrgicos. Si la hemorragia subconjuntival fue causada por un traumatismo, pueden hacerle radiografas o una tomografa computarizada (TC) para determinar si hay otras lesiones. INSTRUCCIONES PARA EL CUIDADO EN EL HOGAR  Tome los medicamentos de venta libre y los recetados solamente como se lo haya indicado el mdico.  Aplique las gotas oftlmicas o las compresas fras para Paramedic las molestias como se lo haya indicado el mdico.  Evite las actividades, las cosas y los entornos que pueden causarle irritacin o lesiones en el ojo.  Concurra a todas las visitas de control como se lo haya indicado el mdico. Esto es importante. SOLICITE ATENCIN MDICA SI:  Siente dolor en el ojo.  El sangrado no desaparece en el trmino de 3semanas.  Sigue teniendo hemorragias subconjuntivales. SOLICITE ATENCIN MDICA DE INMEDIATO SI:  Tiene cambios en la visin o dificultad para ver.  Repentinamente, tiene mucha sensibilidad a la luz.  Tiene dolor de cabeza intenso, vmitos persistentes, confusin o un cansancio que no es normal (letargia).  Parece que el ojo sobresale o se protruye de la rbita.  Le aparecen hematomas en el cuerpo sin motivo.  Tiene sangrado en Theodoro Clock  parte del cuerpo sin motivo. Esta informacin no tiene Theme park manager el consejo del mdico. Asegrese de hacerle al mdico cualquier  pregunta que tenga. Document Released: 12/30/2004 Document Revised: 12/11/2014 Document Reviewed: 05/29/2014 Elsevier Interactive Patient Education  2017 [employer].

## 2016-07-28 NOTE — Addendum Note (Signed)
Addended by: Ardith Dark on: 07/28/2016 10:37 AM   Modules accepted: Orders

## 2016-07-28 NOTE — Progress Notes (Signed)
    Subjective:  Carol Finley is a 66 y.o. female who presents to the [facility] today with a chief complaint of diarrhea. History provided by patient via video spanish interpreter.   HPI:  Diarrhea Symptoms started about a month ago. She is having diarrhea about twice per day. No obvious precipitating events. No abdominal pain. No fevers or chills. Stools are not greasy. No flatulence. Has tried loperamide which has helped some, no other treatments tried. Does not drink milk.  Red Eye Patient woke up with a red eye two days ago. No clear precipitating events. No changes in vision. No eye pain. No discharge.   ROS: Per HPI  PMH: Smoking history reviewed.   Objective:  Physical Exam: BP 126/64   Pulse 79   Temp 98.9 F (37.2 C) (Oral)   Ht 5\' 1"  (1.549 m)   Wt 164 lb (74.4 kg)   SpO2 95%   BMI 30.99 kg/m   Gen: NAD, resting comfortably HEENT: Right conjunctival erythema, limbus sparing. EOMI without pain. No drainage.  CV: RRR with no murmurs appreciated Pulm: NWOB, CTAB with no crackles, wheezes, or rhonchi GI: Normal bowel sounds present. Soft, Nontender, Nondistended. MSK: no edema, cyanosis, or clubbing noted Skin: warm, dry Neuro: grossly normal, moves all extremities Psych: Normal affect and thought content  Assessment/Plan:  Diarrhea Unclear etiology. Broad differential. No signs of systemic illness. History not consistent with inflammatory bowel disease. Given the fact that it has been going on for a month, will further investigate. Will check CBC with diff and GI pathogen panel to rule out infectious causes. Patient is due for a colonoscopy - will refer to GI once she gets financial assistance.   Subconjunctival Hemorrhage No signs of infection. Reassured patient. Will manage conservatively. Return precautions reviewed. Follow up as needed.   Katina Degree. Jimmey Ralph, MD [facility] Family Medicine Resident PGY-3 07/28/2016 10:34 AM

## 2016-07-29 LAB — CBC WITH DIFFERENTIAL/PLATELET
BASOS ABS: 0 10*3/uL (ref 0.0–0.2)
BASOS: 0 %
EOS (ABSOLUTE): 0.1 10*3/uL (ref 0.0–0.4)
EOS: 1 %
HEMOGLOBIN: 10 g/dL — AB (ref 11.1–15.9)
Hematocrit: 32.8 % — ABNORMAL LOW (ref 34.0–46.6)
Immature Grans (Abs): 0 10*3/uL (ref 0.0–0.1)
Immature Granulocytes: 0 %
LYMPHS ABS: 2.1 10*3/uL (ref 0.7–3.1)
LYMPHS: 31 %
MCH: 22.4 pg — ABNORMAL LOW (ref 26.6–33.0)
MCHC: 30.5 g/dL — AB (ref 31.5–35.7)
MCV: 74 fL — ABNORMAL LOW (ref 79–97)
MONOCYTES: 8 %
MONOS ABS: 0.5 10*3/uL (ref 0.1–0.9)
NEUTROS PCT: 60 %
Neutrophils Absolute: 4.2 10*3/uL (ref 1.4–7.0)
Platelets: 391 10*3/uL — ABNORMAL HIGH (ref 150–379)
RBC: 4.46 x10E6/uL (ref 3.77–5.28)
RDW: 18.2 % — ABNORMAL HIGH (ref 12.3–15.4)
WBC: 6.9 10*3/uL (ref 3.4–10.8)

## 2016-08-02 NOTE — Addendum Note (Signed)
Addended by: Jennette Bill on: 08/02/2016 11:16 AM   Modules accepted: Orders

## 2016-08-04 LAB — GI PROFILE, STOOL, PCR

## 2016-08-05 ENCOUNTER — Telehealth: Payer: Self-pay | Admitting: Family Medicine

## 2016-08-05 NOTE — Telephone Encounter (Signed)
Patient's GI panel negative. If she is still having diarrhea, she needs to be seen.  Carol Finley. Jimmey Ralph, MD [facility] Family Medicine Resident PGY-3 08/05/2016 4:54 PM

## 2016-08-06 NOTE — Telephone Encounter (Signed)
Using pacific interpreter [phone], pt was contacted and informed of normal GI labs.

## 2016-08-19 ENCOUNTER — Ambulatory Visit (INDEPENDENT_AMBULATORY_CARE_PROVIDER_SITE_OTHER): Payer: Self-pay | Admitting: Internal Medicine

## 2016-08-19 ENCOUNTER — Encounter: Payer: Self-pay | Admitting: Internal Medicine

## 2016-08-19 ENCOUNTER — Encounter: Payer: Self-pay | Admitting: *Deleted

## 2016-08-19 VITALS — BP 138/70 | HR 74 | Temp 98.7°F | Wt 160.0 lb

## 2016-08-19 DIAGNOSIS — H538 Other visual disturbances: Secondary | ICD-10-CM | POA: Insufficient documentation

## 2016-08-19 DIAGNOSIS — E1100 Type 2 diabetes mellitus with hyperosmolarity without nonketotic hyperglycemic-hyperosmolar coma (NKHHC): Secondary | ICD-10-CM

## 2016-08-19 DIAGNOSIS — R1033 Periumbilical pain: Secondary | ICD-10-CM | POA: Insufficient documentation

## 2016-08-19 LAB — POCT GLYCOSYLATED HEMOGLOBIN (HGB A1C): HEMOGLOBIN A1C: 7.6

## 2016-08-19 NOTE — Patient Instructions (Addendum)
  Fue tan lindo conocerte! Te remit al oculista para que te revise la vista. Tambin lo he derivado al mdico del estmago para que se haga una colonoscopa. Yo recomendara no comer ni beber productos lcteos para ver si eso ayuda a la diarrea.  -Dr. Nancy Marus

## 2016-08-19 NOTE — Progress Notes (Signed)
[facility] Phone: [phone]  Subjective:  Finley Finley is a 66 year old female presenting to clinic with vision problems and abdominal pain.  Vision Problems: She states she has noticed that her glasses are not working as well over the last month. She used to be able to read the bus stop sign from 2 blocks away, but she can no longer do this. She was seen in clinic on 07/28/16 with a subconjunctival hemorrhage. She was managed conservatively. The subconjunctival hemorrhage has resolved on its own. She denies any headaches or eye pain. No eye redness or discharge. She denies any loss of visual fields. She denies any floaters. She describes the vision changes as "blurred vision". She has a history of T2DM and had her last diabetic eye exam in 2014, which did not show any diabetic retinopathy. She has not been seen since then because she did not have any insurance. She just recently received a financial assistance card.  Abdominal pain: Has been going on for 2 months. She is having 2 episodes of diarrhea per day. She is also having some associated morning nausea. No vomiting. The abdominal pain is located around her belly button. The pain feels like a "crampy pain". The pain is worse with drinking milk. Nothing makes the pain better. She is unsure if the pain improves after having a bowel movement. The pain comes and goes "once in a while". No vaginal bleeding. Patient had a colonoscopy 08/19/2009 with Dr. Dulce Sellar, which was normal. She was supposed to follow-up in 10 years. She was also seen in clinic for this on 07/28/16 and had a CBC and stool cultures done, which were normal except for anemia of 10.0.  ROS: See HPI for pertinent positives and negatives  Past Medical History- HTN, uncontrolled T2DM, obesity, HLD, diastolic dysfunction  Family history reviewed for today's visit. No changes.  Social history- patient is a never smoker  Objective: BP 138/70   Pulse 74    Temp 98.7 F (37.1 C) (Oral)   Wt 160 lb (72.6 kg)   BMI 30.23 kg/m  Gen: NAD, alert, cooperative with exam HEENT: NCAT, EOMI, PERRLA, sclera and conjunctivae normal, vision grossly normal GI: +BS, soft, non-distended, +mild tenderness to deep palpation around the umbilicus, no other tenderness, no hepatosplenomegaly, no rebound, no guarding Neuro: No visual field deficits.  Assessment/Plan: Blurred vision: Patient thinks this is related to her glasses not working as well as they used to. Patient is overdue for an annual diabetic eye exam. Last eye exam was 2014 and did not show any diabetic retinopathy. She was seen in clinic on 07/28/16 for subconjunctival hemorrhage, but this has resolved on exam today. - Referral placed to ophthalmology  Periumbilical Abdominal Pain: Associated with diarrhea occurring twice per day. Has been going on for 2 months. Describes the pain as "crampy", but only occurring "once in a while". Nonspecific and located around the umbilicus. Had a CBC and stool studies done last month, which were unremarkable making infectious etiology less likely. Last colonoscopy was in 2011 and was normal. The pain and diarrhea are worse after drinking milk, so may be related to lactose intolerance. Could also be GERD/gastritis or IBS. Think cholelithiasis is less likely given the location of her pain. No red flags and abdomen is soft. - Advised patient to stop consuming dairy products, as she thinks that the pain and diarrhea are worse after drinking milk. - Follow-up with PCP if no improvement   Finley Carol, MD  PGY-2

## 2016-08-19 NOTE — Assessment & Plan Note (Signed)
Patient thinks this is related to her glasses not working as well as they used to. Patient is overdue for an annual diabetic eye exam. Last eye exam was 2014 and did not show any diabetic retinopathy. She was seen in clinic on 07/28/16 for subconjunctival hemorrhage, but this has resolved on exam today. - Referral placed to ophthalmology

## 2016-08-19 NOTE — Assessment & Plan Note (Signed)
Associated with diarrhea occurring twice per day. Has been going on for 2 months. Describes the pain as "crampy", but only occurring "once in a while". Nonspecific and located around the umbilicus. Had a CBC and stool studies done last month, which were unremarkable making infectious etiology less likely. Last colonoscopy was in 2011 and was normal. The pain and diarrhea are worse after drinking milk, so may be related to lactose intolerance. Could also be GERD/gastritis or IBS. Think cholelithiasis is less likely given the location of her pain. No red flags and abdomen is soft. - Advised patient to stop consuming dairy products, as she thinks that the pain and diarrhea are worse after drinking milk. - Follow-up with PCP if no improvement

## 2016-08-25 ENCOUNTER — Other Ambulatory Visit: Payer: Self-pay | Admitting: *Deleted

## 2016-08-25 MED ORDER — GABAPENTIN 300 MG PO CAPS: 600.0000 mg | ORAL_CAPSULE | Freq: Three times a day (TID) | ORAL | 11 refills | Status: DC

## 2016-09-06 ENCOUNTER — Ambulatory Visit (INDEPENDENT_AMBULATORY_CARE_PROVIDER_SITE_OTHER): Payer: Self-pay | Admitting: Student

## 2016-09-06 ENCOUNTER — Encounter: Payer: Self-pay | Admitting: Student

## 2016-09-06 VITALS — BP 120/62 | HR 95 | Temp 98.6°F | Wt 156.0 lb

## 2016-09-06 DIAGNOSIS — K529 Noninfective gastroenteritis and colitis, unspecified: Secondary | ICD-10-CM

## 2016-09-06 MED ORDER — ONDANSETRON HCL 4 MG PO TABS: 4.0000 mg | ORAL_TABLET | Freq: Three times a day (TID) | ORAL | 0 refills | Status: DC | PRN

## 2016-09-06 NOTE — Patient Instructions (Signed)
Follow up in 1 week of symptoms not better Make sure you wah your hands regularly, espescially after using the bathroom Call the office with questions or concerns

## 2016-09-06 NOTE — Progress Notes (Signed)
   Subjective:    Patient ID: Carol Finley, female    DOB: 1951/03/20, 66 y.o.   MRN: [id number]   CC: diarrhea, nausea  HPI: 66 y/o presents for diarrhea nausea and abdominal pain  Diarrhea, nausea, abdominal pain - started 3 days ago - she felt feverish last week but did not check her temperature - she has no known sick contacts - she has been able tolerate PO - she has not had any emesis - she does have school age grandchildren that olive with her - no rash, cough, or runny nose   Smoking status reviewed  Review of Systems  Per HPI, else denies  chest pain, shortness of breath,     Objective:  BP 120/62   Pulse 95   Temp 98.6 F (37 C) (Oral)   Wt 156 lb (70.8 kg)   SpO2 97%   BMI 29.48 kg/m  Vitals and nursing note reviewed  General: NAD Cardiac: RRR Respiratory: CTAB, normal effort Abdomen: soft, nontender, nondistended, no hepatic or splenomegaly. Bowel sounds present Skin: warm and dry, no rashes noted Neuro: alert and oriented, no focal deficits   Assessment & Plan:    Gastroenteritis History and physical exam consistent with gastroenteritis. Able to tolerate PO - will manage conservatively - zofran PRN nausea - hydration precautions discussed - follow up precautions discussed    Layne Lebon A. Kennon Rounds MD, MS Family Medicine Resident PGY-3 Pager [phone]

## 2016-09-06 NOTE — Progress Notes (Signed)
   Subjective:    Patient ID: Carol Finley, female    DOB: 04-16-1950, 66 y.o.   MRN: [id number]   CC:  HPI:   Smoking status reviewed  Review of Systems  Per HPI, else denies recent illness, fever, headache, changes in vision, chest pain, shortness of breath, abdominal pain, N/V/D, weakness    Objective:  BP 120/62   Pulse 95   Temp 98.6 F (37 C) (Oral)   Wt 156 lb (70.8 kg)   SpO2 97%   BMI 29.48 kg/m  Vitals and nursing note reviewed  General: NAD Cardiac: RRR, normal heart sounds, no murmurs. 2+ radial and PT pulses bilaterally Respiratory: CTAB, normal effort Abdomen: soft, nontender, nondistended, no hepatic or splenomegaly. Bowel sounds present Extremities: no edema or cyanosis. WWP. Skin: warm and dry, no rashes noted Neuro: alert and oriented, no focal deficits   Assessment & Plan:    No problem-specific Assessment & Plan notes found for this encounter.    Alyssa A. Kennon Rounds MD, MS Family Medicine Resident PGY-3 Pager [phone]

## 2016-09-06 NOTE — Assessment & Plan Note (Signed)
History and physical exam consistent with gastroenteritis. Able to tolerate PO - will manage conservatively - zofran PRN nausea - hydration precautions discussed - follow up precautions discussed

## 2016-10-15 ENCOUNTER — Other Ambulatory Visit: Payer: Self-pay | Admitting: *Deleted

## 2016-10-15 DIAGNOSIS — E119 Type 2 diabetes mellitus without complications: Secondary | ICD-10-CM

## 2016-10-15 DIAGNOSIS — Z794 Long term (current) use of insulin: Principal | ICD-10-CM

## 2016-10-15 MED ORDER — INSULIN NPH (HUMAN) (ISOPHANE) 100 UNIT/ML ~~LOC~~ SUSP: 17.0000 [IU] | Freq: Two times a day (BID) | SUBCUTANEOUS | 11 refills | Status: DC

## 2016-11-02 ENCOUNTER — Other Ambulatory Visit: Payer: Self-pay | Admitting: Internal Medicine

## 2016-11-02 DIAGNOSIS — I10 Essential (primary) hypertension: Secondary | ICD-10-CM

## 2016-11-02 NOTE — Telephone Encounter (Signed)
The patient (as I can understand her) says she is having a problem getting her Furosemide, Lisinopril and her insulin filled at [facility]. Health dept.  She needs these filled. [phone] please call with someone who can speak spanish.

## 2016-11-03 MED ORDER — LISINOPRIL 40 MG PO TABS: 40.0000 mg | ORAL_TABLET | Freq: Every day | ORAL | 11 refills | Status: DC

## 2016-11-03 MED ORDER — FUROSEMIDE 20 MG PO TABS: 20.0000 mg | ORAL_TABLET | Freq: Every day | ORAL | 11 refills | Status: DC

## 2016-11-03 NOTE — Telephone Encounter (Signed)
Patient out of refills for these medications, need [facility]ill [facility].

## 2016-11-11 LAB — HM DIABETES EYE EXAM

## 2016-12-01 ENCOUNTER — Other Ambulatory Visit: Payer: Self-pay | Admitting: Internal Medicine

## 2016-12-01 DIAGNOSIS — I10 Essential (primary) hypertension: Secondary | ICD-10-CM

## 2016-12-01 MED ORDER — NIFEDIPINE ER OSMOTIC RELEASE 60 MG PO TB24: 60.0000 mg | ORAL_TABLET | Freq: Every day | ORAL | 3 refills | Status: DC

## 2016-12-01 NOTE — Telephone Encounter (Signed)
Patient changed her pharmacy to [facility] , [address], # [phone] and would like her Procardia XL 60 mg  (100 tabs) refilled asap please.  Call [phone] w/questions.

## 2016-12-03 ENCOUNTER — Telehealth: Payer: Self-pay | Admitting: *Deleted

## 2016-12-03 ENCOUNTER — Telehealth: Payer: Self-pay | Admitting: Internal Medicine

## 2016-12-03 DIAGNOSIS — I10 Essential (primary) hypertension: Secondary | ICD-10-CM

## 2016-12-03 NOTE — Telephone Encounter (Signed)
Received fax from [facility] stating patient can no longer get Humulin N for free. Please write for Novolin Nelta Numbers, RN

## 2016-12-03 NOTE — Telephone Encounter (Signed)
Patient went to pick up meds at [facility] pharmacy and it is very expensive there so can she switch to [facility] on [facility]?  [phone] you can reach her w/questions

## 2016-12-05 MED ORDER — NIFEDIPINE ER OSMOTIC RELEASE 60 MG PO TB24: 60.0000 mg | ORAL_TABLET | Freq: Every day | ORAL | 3 refills | Status: DC

## 2016-12-07 MED ORDER — INSULIN NPH ISOPHANE & REGULAR (70-30) 100 UNIT/ML ~~LOC~~ SUSP: 17.0000 [IU] | Freq: Two times a day (BID) | SUBCUTANEOUS | 11 refills | Status: DC

## 2016-12-07 NOTE — Telephone Encounter (Signed)
Left message for [facility] pharmacy to fill novolin n 17 U BID with meals, 10 mL, with 11 refills.  Dani Gobble, MD [facility], PGY-3

## 2016-12-21 ENCOUNTER — Ambulatory Visit: Payer: Self-pay | Attending: Internal Medicine

## 2017-01-11 ENCOUNTER — Encounter: Payer: Self-pay | Admitting: Internal Medicine

## 2017-01-26 ENCOUNTER — Ambulatory Visit: Payer: Self-pay | Attending: Family Medicine | Admitting: Physician Assistant

## 2017-01-26 ENCOUNTER — Encounter: Payer: Self-pay | Admitting: Physician Assistant

## 2017-01-26 VITALS — BP 185/72 | HR 78 | Temp 98.3°F | Resp 18 | Ht 61.0 in | Wt 165.8 lb

## 2017-01-26 DIAGNOSIS — M1990 Unspecified osteoarthritis, unspecified site: Secondary | ICD-10-CM | POA: Insufficient documentation

## 2017-01-26 DIAGNOSIS — R197 Diarrhea, unspecified: Secondary | ICD-10-CM

## 2017-01-26 DIAGNOSIS — R1013 Epigastric pain: Secondary | ICD-10-CM

## 2017-01-26 DIAGNOSIS — Z7982 Long term (current) use of aspirin: Secondary | ICD-10-CM | POA: Insufficient documentation

## 2017-01-26 DIAGNOSIS — E1165 Type 2 diabetes mellitus with hyperglycemia: Secondary | ICD-10-CM

## 2017-01-26 DIAGNOSIS — M159 Polyosteoarthritis, unspecified: Secondary | ICD-10-CM

## 2017-01-26 DIAGNOSIS — F5089 Other specified eating disorder: Secondary | ICD-10-CM

## 2017-01-26 DIAGNOSIS — I10 Essential (primary) hypertension: Secondary | ICD-10-CM

## 2017-01-26 DIAGNOSIS — Z79899 Other long term (current) drug therapy: Secondary | ICD-10-CM | POA: Insufficient documentation

## 2017-01-26 DIAGNOSIS — Z794 Long term (current) use of insulin: Secondary | ICD-10-CM | POA: Insufficient documentation

## 2017-01-26 LAB — GLUCOSE, POCT (MANUAL RESULT ENTRY): POC Glucose: 143 mg/dl — AB (ref 70–99)

## 2017-01-26 LAB — POCT GLYCOSYLATED HEMOGLOBIN (HGB A1C): HEMOGLOBIN A1C: 8.2

## 2017-01-26 MED ORDER — NAPROXEN 500 MG PO TABS: 500.0000 mg | ORAL_TABLET | Freq: Two times a day (BID) | ORAL | 1 refills | Status: DC | PRN

## 2017-01-26 MED ORDER — DIPHENOXYLATE-ATROPINE 2.5-0.025 MG PO TABS: ORAL_TABLET | ORAL | 0 refills | Status: DC

## 2017-01-26 MED ORDER — OMEPRAZOLE 20 MG PO CPDR: 20.0000 mg | DELAYED_RELEASE_CAPSULE | Freq: Every day | ORAL | 3 refills | Status: DC

## 2017-01-26 NOTE — Progress Notes (Signed)
Patient ID: Carol Finley, female   DOB: April 28, 1950, 66 y.o.   MRN: [id number]     Carol Finley, is a 66 y.o. female  [id number]  [id number]  DOB - 31-Mar-1951  Subjective:  Chief Complaint and HPI: Carol Finley is a 66 y.o. female here today for "stomach infection" at home with diarrhea.  Pain in stomach a little after eating with cramping that is mild in nature.  OTC Pepto Bismol and Immodium did not help.  Symptoms for 2 weeks.  Loose BM about 4 times/day.  No dysuria.  No f/c.  No melena/hematochezia. +nausa w/o vomiting.  No recent travel.  No change in appetite.  Blood sugars run from 120s to 200.   Craves eating ice for a long time.    Myrna translating with [employer]  ROS:   Constitutional:  No f/c, No night sweats, No unexplained weight loss. EENT:  No vision changes, No blurry vision, No hearing changes. No mouth, throat, or ear problems.  Respiratory: No cough, No SOB Cardiac: No CP, no palpitations GI:  + abd pain, + N.  No vomiting.  +D. No constiptaion GU: No Urinary s/sx Musculoskeletal: No joint pain Neuro: No headache, no dizziness, no motor weakness.  Skin: No rash Endocrine:  No polydipsia. No polyuria.  Psych: Denies SI/HI  No problems updated.  ALLERGIES: No Known Allergies  PAST MEDICAL HISTORY: Past Medical History:  Diagnosis Date  . BACTERIAL PNEUMONIA 10/09/2009   Qualifier: Diagnosis of  By: Huntley Dec, Scott    . Diabetes mellitus without complication (HCC)   . Hypertension     MEDICATIONS AT HOME: Prior to Admission medications   Medication Sig Start Date End Date Taking? Authorizing Provider  aspirin 81 MG tablet Take 1 tablet (81 mg total) by mouth daily. 11/19/14   Casey Burkitt, MD  atorvastatin (LIPITOR) 40 MG tablet Take 1 tablet (40 mg total) by mouth daily. 06/29/16   Jaclyn Shaggy, MD  cloNIDine (CATAPRES) 0.3 MG tablet Take 1 tablet (0.3 mg total) by mouth 3  (three) times daily. 04/29/16   McKeag, Janine Ores, MD  diphenoxylate-atropine (LOMOTIL) 2.5-0.025 MG tablet 1-2 tabs up to 4 times daily for diarrhea 01/26/17   Georgian Co M, PA-C  furosemide (LASIX) 20 MG tablet Take 1 tablet (20 mg total) by mouth daily. 11/03/16   Casey Burkitt, MD  gabapentin (NEURONTIN) 300 MG capsule Take 2 capsules (600 mg total) by mouth 3 (three) times daily. 08/25/16   McKeag, Janine Ores, MD  glucose blood test strip Use as instructed 08/08/12   Garnetta Buddy, MD  insulin NPH Human (HUMULIN N) 100 UNIT/ML injection Inject 0.17 mLs (17 Units total) into the skin 2 (two) times daily before a meal. 10/15/16   Casey Burkitt, MD  insulin NPH-regular Human (NOVOLIN 70/30) (70-30) 100 UNIT/ML injection Inject 17 Units into the skin 2 (two) times daily with a meal. 12/07/16   Casey Burkitt, MD  lisinopril (PRINIVIL,ZESTRIL) 40 MG tablet Take 1 tablet (40 mg total) by mouth daily. 11/03/16   Casey Burkitt, MD  loratadine (CLARITIN) 10 MG tablet Take 1 tablet (10 mg total) by mouth daily. Patient not taking: Reported on 05/10/2016 06/11/14   Street, Stephanie Coup, MD  metFORMIN (GLUCOPHAGE) 1000 MG tablet Take 1 tablet (1,000 mg total) by mouth 2 (two) times daily with a meal. 06/29/16   Jaclyn Shaggy, MD  naproxen (NAPROSYN) 500 MG tablet Take 1 tablet (500 mg total) by mouth 2 (  two) times daily as needed. 01/26/17   Anders SimmondsMcClung, Joleene Burnham M, PA-C  NIFEdipine (PROCARDIA XL/ADALAT-CC) 60 MG 24 hr tablet Take 1 tablet (60 mg total) by mouth daily. 12/05/16   Casey BurkittFitzgerald, Hillary Moen, MD  omeprazole (PRILOSEC) 20 MG capsule Take 1 capsule (20 mg total) by mouth daily. 01/26/17   Anders SimmondsMcClung, Minah Axelrod M, PA-C  ondansetron (ZOFRAN) 4 MG tablet Take 1 tablet (4 mg total) by mouth every 8 (eight) hours as needed for nausea or vomiting. 09/06/16   Haney, Jeanann LewandowskyAlyssa A, MD  oxymetazoline (AFRIN) 0.05 % nasal spray Place 1 spray into both nostrils 2 (two) times daily. 07/13/16    Ardith DarkParker, Caleb M, MD  sodium chloride (OCEAN) 0.65 % SOLN nasal spray Place 1 spray into both nostrils as needed for congestion. 07/13/16   Ardith DarkParker, Caleb M, MD  traZODone (DESYREL) 50 MG tablet Take 1 tablet (50 mg total) by mouth at bedtime as needed for sleep. 05/10/16   Jaclyn ShaggyAmao, Enobong, MD     Objective:  EXAM:   Vitals:   01/26/17 0849  BP: (!) 185/72  Pulse: 78  Resp: 18  Temp: 98.3 F (36.8 C)  TempSrc: Oral  SpO2: 96%  Weight: 165 lb 12.8 oz (75.2 kg)  Height: 5\' 1"  (1.549 m)    General appearance : A&OX3. NAD. Non-toxic-appearing HEENT: Atraumatic and Normocephalic.  PERRLA. EOM intact.  TM clear B. Mouth-MMM, post pharynx WNL w/o erythema, No PND. Neck: supple, no JVD. No cervical lymphadenopathy. No thyromegaly Chest/Lungs:  Breathing-non-labored, Good air entry bilaterally, breath sounds normal without rales, rhonchi, or wheezing  CVS: S1 S2 regular, no murmurs, gallops, rubs  Abdomen: Bowel sounds present, Non tender and not distended with no gaurding, rigidity or rebound. Extremities: Bilateral Lower Ext shows no edema, both legs are warm to touch with = pulse throughout Neurology:  CN II-XII grossly intact, Non focal.   Psych:  TP linear. J/I WNL. Normal speech. Appropriate eye contact and affect.  Skin:  No Rash  Data Review Lab Results  Component Value Date   HGBA1C 8.2 01/26/2017   HGBA1C 7.6 08/19/2016   HGBA1C 7.6 05/10/2016     Assessment & Plan   1. Uncontrolled type 2 diabetes mellitus with hyperglycemia (HCC) I have had a lengthy discussion and provided education about insulin resistance and the intake of too much sugar/refined carbohydrates.  I have advised the patient to work at a goal of eliminating sugary drinks, candy, desserts, sweets, refined sugars, processed foods, and white carbohydrates.  The patient expresses understanding.  No changes in meds at this time.  Work aggressively on diet and exercise.   - HgB A1c - Glucose (CBG)  2.  Essential hypertension, benign Uncontrolled-didn't take meds today.  Take meds as directed.  We have discussed target BP range and blood pressure goal. I have advised patient to check BP regularly and to call us back or report to clinic if the numbers are consistently higher than 140/90. We discussed the importance of compliance with medical therapy and DASH diet recommended, consequences of uncontrolled hypertension discussed.   3. Osteoarthritis of multiple joints, unspecified osteoarthritis type - naproxen (NAPROSYN) 500 MG tablet; Take 1 tablet (500 mg total) by mouth 2 (two) times daily as needed.  Dispense: 60 tablet; Refill: 1 Take with food so as to not worsen stomach symptoms  4. Epigastric pain Non-acute abdomen - H. pylori breath test - Comprehensive metabolic panel  5. Diarrhea, unspecified type - H. pylori breath test - Comprehensive metabolic panel  6. Pica - CBC with Differential/Platelet  Patient have been counseled extensively about nutrition and exercise  Return in about 10 weeks (around 04/06/2017) for Dr Venetia Night: DM f/up.  The patient was given clear instructions to go to ER or return to medical center if symptoms don't improve, worsen or new problems develop. The patient verbalized understanding. The patient was told to call to get lab results if they haven't heard anything in the next week.     Georgian Co, PA-C [facility] [place], [place] [phone]   01/26/2017, 9:04 AM

## 2017-01-27 LAB — COMPREHENSIVE METABOLIC PANEL
A/G RATIO: 1.1 — AB (ref 1.2–2.2)
ALK PHOS: 103 IU/L (ref 39–117)
ALT: 14 IU/L (ref 0–32)
AST: 25 IU/L (ref 0–40)
Albumin: 3.9 g/dL (ref 3.6–4.8)
BILIRUBIN TOTAL: 0.6 mg/dL (ref 0.0–1.2)
BUN/Creatinine Ratio: 11 — ABNORMAL LOW (ref 12–28)
BUN: 9 mg/dL (ref 8–27)
CHLORIDE: 102 mmol/L (ref 96–106)
CO2: 23 mmol/L (ref 20–29)
Calcium: 9.7 mg/dL (ref 8.7–10.3)
Creatinine, Ser: 0.83 mg/dL (ref 0.57–1.00)
GFR calc Af Amer: 85 mL/min/{1.73_m2} (ref >59)
GFR calc non Af Amer: 74 mL/min/{1.73_m2} (ref >59)
Globulin, Total: 3.5 g/dL (ref 1.5–4.5)
Glucose: 87 mg/dL (ref 65–99)
POTASSIUM: 4.7 mmol/L (ref 3.5–5.2)
Sodium: 142 mmol/L (ref 134–144)
Total Protein: 7.4 g/dL (ref 6.0–8.5)

## 2017-01-27 LAB — CBC WITH DIFFERENTIAL/PLATELET
BASOS: 1 %
Basophils Absolute: 0 10*3/uL (ref 0.0–0.2)
EOS (ABSOLUTE): 0.1 10*3/uL (ref 0.0–0.4)
EOS: 2 %
HEMATOCRIT: 30.6 % — AB (ref 34.0–46.6)
HEMOGLOBIN: 9.1 g/dL — AB (ref 11.1–15.9)
IMMATURE GRANS (ABS): 0 10*3/uL (ref 0.0–0.1)
IMMATURE GRANULOCYTES: 0 %
LYMPHS: 34 %
Lymphocytes Absolute: 2.2 10*3/uL (ref 0.7–3.1)
MCH: 20.5 pg — ABNORMAL LOW (ref 26.6–33.0)
MCHC: 29.7 g/dL — ABNORMAL LOW (ref 31.5–35.7)
MCV: 69 fL — AB (ref 79–97)
Monocytes Absolute: 0.6 10*3/uL (ref 0.1–0.9)
Monocytes: 10 %
NEUTROS PCT: 53 %
Neutrophils Absolute: 3.5 10*3/uL (ref 1.4–7.0)
Platelets: 362 10*3/uL (ref 150–379)
RBC: 4.43 x10E6/uL (ref 3.77–5.28)
RDW: 18.2 % — ABNORMAL HIGH (ref 12.3–15.4)
WBC: 6.5 10*3/uL (ref 3.4–10.8)

## 2017-01-28 LAB — H. PYLORI BREATH TEST: H PYLORI BREATH TEST: POSITIVE — AB

## 2017-01-31 ENCOUNTER — Other Ambulatory Visit: Payer: Self-pay | Admitting: Physician Assistant

## 2017-01-31 MED ORDER — CLARITHROMYCIN 500 MG PO TABS: 500.0000 mg | ORAL_TABLET | Freq: Two times a day (BID) | ORAL | 0 refills | Status: DC

## 2017-01-31 MED ORDER — OMEPRAZOLE 20 MG PO CPDR: 20.0000 mg | DELAYED_RELEASE_CAPSULE | Freq: Two times a day (BID) | ORAL | 0 refills | Status: DC

## 2017-01-31 MED ORDER — FERROUS SULFATE 325 (65 FE) MG PO TABS: 325.0000 mg | ORAL_TABLET | Freq: Every day | ORAL | 1 refills | Status: AC

## 2017-01-31 MED ORDER — AMOXICILLIN 500 MG PO CAPS: 1000.0000 mg | ORAL_CAPSULE | Freq: Two times a day (BID) | ORAL | 0 refills | Status: DC

## 2017-02-16 ENCOUNTER — Telehealth: Payer: Self-pay | Admitting: *Deleted

## 2017-02-16 NOTE — Telephone Encounter (Signed)
Received: 2 weeks ago  Message Contents  Anders Simmonds, PA-C  [place], [place], [place]        Please call patient. She tested + for H. Pylori. I have sent her 3 medications to the pharmacy to start ASAP. This should help with her stomach issues. I have also sent her a prescription for Iron because her Hemoglobin was low. This will help with fatigue. Her other labs were normal.  Follow-up as planned.  Georgian Co, PA-C    Left message on voicemail to return call with assistance of [employer]- [phone].

## 2017-02-16 NOTE — Telephone Encounter (Signed)
-----   Message from Margaretmary [place], [place] sent at 02/16/2017  2:48 PM EST ----- Please advise spanish speaking patient of results

## 2017-02-16 NOTE — Telephone Encounter (Signed)
CMA call regarding lab results   Patient Verify DOB   Patient was aware and understood  

## 2017-02-17 MED FILL — AMOXICILLIN 500 MG CAPSULE: 500 | 10 days supply | Qty: 40 | Fill #0

## 2017-02-17 MED FILL — FERROUS SULFATE 325 MG TAB: 325 (65 FE) | 30 days supply | Qty: 30 | Fill #0

## 2017-02-17 MED FILL — CLARITHROMYCIN 500 MG TAB: 500 | 10 days supply | Qty: 20 | Fill #0

## 2017-02-17 MED FILL — ?OMEPRAZOLE DR 20 MG CAPSUL: 20 | 15 days supply | Qty: 30 | Fill #0

## 2017-02-23 ENCOUNTER — Telehealth: Payer: Self-pay | Admitting: Family Medicine

## 2017-02-23 ENCOUNTER — Ambulatory Visit: Payer: Self-pay | Attending: Family Medicine

## 2017-02-23 DIAGNOSIS — K0889 Other specified disorders of teeth and supporting structures: Secondary | ICD-10-CM

## 2017-02-23 NOTE — Telephone Encounter (Signed)
Will route to PCP 

## 2017-02-23 NOTE — Telephone Encounter (Signed)
Pt came into office requesting a dental referral, pt was referred back on 2/23 but did not receive a call. Please f/up

## 2017-02-23 NOTE — Telephone Encounter (Signed)
Referral has been placed. 

## 2017-03-16 ENCOUNTER — Other Ambulatory Visit: Payer: Self-pay | Admitting: Physician Assistant

## 2017-03-16 MED FILL — ?OMEPRAZOLE DR 20MG CAPSULE: 20 | 30 days supply | Qty: 30 | Fill #0

## 2017-03-16 MED FILL — FERROUS SULFATE 325 MG TAB: 325 (65 FE) | 30 days supply | Qty: 30 | Fill #1

## 2017-03-17 MED FILL — ATORVASTATIN 80 MG TABLET: 80 | 30 days supply | Qty: 15 | Fill #0

## 2017-03-17 MED FILL — ?CLONDINE HCL 0.3 MG TABLET: 0.3 | 30 days supply | Qty: 90 | Fill #0

## 2017-03-17 MED FILL — ?FUROSEMIDE 20 MG TABLET: 20 | 30 days supply | Qty: 30 | Fill #0

## 2017-03-17 MED FILL — ?METFORMIN HCL 1,000 MG TAB: 1000 | 30 days supply | Qty: 60 | Fill #0

## 2017-03-17 MED FILL — LISINOPRIL 40 MG TAB: 40 | 30 days supply | Qty: 30 | Fill #0

## 2017-03-17 MED FILL — GABAPENTIN 300 MG CAPSULE: 300 | 30 days supply | Qty: 180 | Fill #0

## 2017-04-11 ENCOUNTER — Other Ambulatory Visit: Payer: Self-pay

## 2017-04-11 ENCOUNTER — Encounter: Payer: Self-pay | Admitting: Family Medicine

## 2017-04-11 ENCOUNTER — Ambulatory Visit: Payer: Self-pay | Attending: Family Medicine | Admitting: Family Medicine

## 2017-04-11 VITALS — BP 193/73 | HR 77 | Temp 98.1°F | Ht 61.0 in | Wt 169.0 lb

## 2017-04-11 DIAGNOSIS — E1165 Type 2 diabetes mellitus with hyperglycemia: Secondary | ICD-10-CM | POA: Insufficient documentation

## 2017-04-11 DIAGNOSIS — Z7982 Long term (current) use of aspirin: Secondary | ICD-10-CM | POA: Insufficient documentation

## 2017-04-11 DIAGNOSIS — Z9119 Patient's noncompliance with other medical treatment and regimen: Secondary | ICD-10-CM | POA: Insufficient documentation

## 2017-04-11 DIAGNOSIS — K2970 Gastritis, unspecified, without bleeding: Secondary | ICD-10-CM | POA: Insufficient documentation

## 2017-04-11 DIAGNOSIS — Z79899 Other long term (current) drug therapy: Secondary | ICD-10-CM | POA: Insufficient documentation

## 2017-04-11 DIAGNOSIS — I10 Essential (primary) hypertension: Secondary | ICD-10-CM | POA: Insufficient documentation

## 2017-04-11 DIAGNOSIS — E1140 Type 2 diabetes mellitus with diabetic neuropathy, unspecified: Secondary | ICD-10-CM | POA: Insufficient documentation

## 2017-04-11 DIAGNOSIS — Z794 Long term (current) use of insulin: Secondary | ICD-10-CM | POA: Insufficient documentation

## 2017-04-11 DIAGNOSIS — B9681 Helicobacter pylori [H. pylori] as the cause of diseases classified elsewhere: Secondary | ICD-10-CM | POA: Insufficient documentation

## 2017-04-11 LAB — POCT GLYCOSYLATED HEMOGLOBIN (HGB A1C): HEMOGLOBIN A1C: 8

## 2017-04-11 LAB — GLUCOSE, POCT (MANUAL RESULT ENTRY): POC GLUCOSE: 150 mg/dL — AB (ref 70–99)

## 2017-04-11 MED ORDER — TRUEPLUS LANCETS 28G MISC: 11 refills | Status: DC

## 2017-04-11 MED ORDER — ISOSORBIDE MONONITRATE ER 30 MG PO TB24: 30.0000 mg | ORAL_TABLET | Freq: Every day | ORAL | 6 refills | Status: DC

## 2017-04-11 MED ORDER — INSULIN NPH ISOPHANE & REGULAR (70-30) 100 UNIT/ML ~~LOC~~ SUSP: 20.0000 [IU] | Freq: Two times a day (BID) | SUBCUTANEOUS | 6 refills | Status: DC

## 2017-04-11 MED ORDER — TRUE METRIX METER DEVI: 1.0000 | Freq: Three times a day (TID) | 0 refills | Status: DC

## 2017-04-11 MED ORDER — GLUCOSE BLOOD VI STRP: 1.0000 | ORAL_STRIP | Freq: Three times a day (TID) | 12 refills | Status: DC

## 2017-04-11 MED FILL — TRUEplus LANCETS 28G MISC: 30 days supply | Qty: 100 | Fill #0

## 2017-04-11 MED FILL — !TRUE METRIX BLOOD GLUCOSE: 365 days supply | Qty: 1 | Fill #0

## 2017-04-11 MED FILL — TRUE METRIX TEST STRIP: 30 days supply | Qty: 100 | Fill #0

## 2017-04-11 NOTE — Progress Notes (Signed)
Subjective:  Patient ID: Carol Finley, female    DOB: 1951-03-22  Age: 67 y.o. MRN: [id number]  CC: Diabetes   HPI Carol Finley is a 67 year old female with a history of type 2 diabetes mellitus (A1c 8.0), hypertension, diabetic neuropathy here for follow-up visit.  She endorses compliance with her medications but does not have her bottles with her and her A1c is 8.0.  She has not been compliant with a diabetic diet.  She complains of growling of her abdomen which is uncomfortable but denies nausea, vomiting, diarrhea or constipation.  3 months ago she tested positive for Helicobacter pylori antibody and was treated with a course of amoxicillin, Biaxin and omeprazole.  Her blood pressure is severely elevated and she endorses compliance with her antihypertensives.  Denies chest pain, shortness of breath or blurry vision.  Past Medical History:  Diagnosis Date  . BACTERIAL PNEUMONIA 10/09/2009   Qualifier: Diagnosis of  By: Huntley Dec, Scott    . Diabetes mellitus without complication (HCC)   . Hypertension     History reviewed. No pertinent surgical history.  No Known Allergies   Outpatient Medications Prior to Visit  Medication Sig Dispense Refill  . aspirin 81 MG tablet Take 1 tablet (81 mg total) by mouth daily. 30 tablet 5  . atorvastatin (LIPITOR) 40 MG tablet Take 1 tablet (40 mg total) by mouth daily. 90 tablet 3  . clarithromycin (BIAXIN) 500 MG tablet Take 1 tablet (500 mg total) by mouth 2 (two) times daily. 20 tablet 0  . cloNIDine (CATAPRES) 0.3 MG tablet Take 1 tablet (0.3 mg total) by mouth 3 (three) times daily. 90 tablet 11  . diphenoxylate-atropine (LOMOTIL) 2.5-0.025 MG tablet 1-2 tabs up to 4 times daily for diarrhea 30 tablet 0  . ferrous sulfate 325 (65 FE) MG tablet Take 1 tablet (325 mg total) by mouth daily with breakfast. 100 tablet 1  . furosemide (LASIX) 20 MG tablet Take 1 tablet (20 mg total) by mouth daily. 30 tablet 11    . gabapentin (NEURONTIN) 300 MG capsule Take 2 capsules (600 mg total) by mouth 3 (three) times daily. 180 capsule 11  . glucose blood test strip Use as instructed 100 each 12  . lisinopril (PRINIVIL,ZESTRIL) 40 MG tablet Take 1 tablet (40 mg total) by mouth daily. 30 tablet 11  . metFORMIN (GLUCOPHAGE) 1000 MG tablet Take 1 tablet (1,000 mg total) by mouth 2 (two) times daily with a meal. 60 tablet 11  . naproxen (NAPROSYN) 500 MG tablet Take 1 tablet (500 mg total) by mouth 2 (two) times daily as needed. 60 tablet 1  . omeprazole (PRILOSEC) 20 MG capsule TAKE 1 CAPSULE BY MOUTH 2 TIMES DAILY BEFORE A MEAL. 30 capsule 0  . ondansetron (ZOFRAN) 4 MG tablet Take 1 tablet (4 mg total) by mouth every 8 (eight) hours as needed for nausea or vomiting. 5 tablet 0  . oxymetazoline (AFRIN) 0.05 % nasal spray Place 1 spray into both nostrils 2 (two) times daily. 30 mL 0  . sodium chloride (OCEAN) 0.65 % SOLN nasal spray Place 1 spray into both nostrils as needed for congestion. 60 mL 0  . insulin NPH Human (HUMULIN N) 100 UNIT/ML injection Inject 0.17 mLs (17 Units total) into the skin 2 (two) times daily before a meal. 10 mL 11  . insulin NPH-regular Human (NOVOLIN 70/30) (70-30) 100 UNIT/ML injection Inject 17 Units into the skin 2 (two) times daily with a meal. 10 mL 11  .  amoxicillin (AMOXIL) 500 MG capsule Take 2 capsules (1,000 mg total) by mouth 2 (two) times daily. (Patient not taking: Reported on 04/11/2017) 40 capsule 0  . loratadine (CLARITIN) 10 MG tablet Take 1 tablet (10 mg total) by mouth daily. (Patient not taking: Reported on 05/10/2016) 30 tablet 11  . NIFEdipine (PROCARDIA XL/ADALAT-CC) 60 MG 24 hr tablet Take 1 tablet (60 mg total) by mouth daily. (Patient not taking: Reported on 04/11/2017) 90 tablet 3  . traZODone (DESYREL) 50 MG tablet Take 1 tablet (50 mg total) by mouth at bedtime as needed for sleep. (Patient not taking: Reported on 04/11/2017) 30 tablet 3   No facility-administered  medications prior to visit.     ROS Review of Systems  Constitutional: Negative for activity change, appetite change and fatigue.  HENT: Negative for congestion, sinus pressure and sore throat.   Eyes: Negative for visual disturbance.  Respiratory: Negative for cough, chest tightness, shortness of breath and wheezing.   Cardiovascular: Negative for chest pain and palpitations.  Gastrointestinal: Negative for abdominal distention, abdominal pain and constipation.  Endocrine: Negative for polydipsia.  Genitourinary: Negative for dysuria and frequency.  Musculoskeletal: Negative for arthralgias and back pain.  Skin: Negative for rash.  Neurological: Negative for tremors, light-headedness and numbness.  Hematological: Does not bruise/bleed easily.  Psychiatric/Behavioral: Negative for agitation and behavioral problems.    Objective:  BP (!) 193/73   Pulse 77   Temp 98.1 F (36.7 C) (Oral)   Ht 5\' 1"  (1.549 m)   Wt 169 lb (76.7 kg)   SpO2 98%   BMI 31.93 kg/m   BP/Weight 04/11/2017 01/26/2017 09/06/2016  Systolic BP 193 185 120  Diastolic BP 73 72 62  Wt. (Lbs) 169 165.8 156  BMI 31.93 31.33 29.48      Physical Exam  Constitutional: She is oriented to person, place, and time. She appears well-developed and well-nourished.  Cardiovascular: Normal rate, normal heart sounds and intact distal pulses.  No murmur heard. Pulmonary/Chest: Effort normal and breath sounds normal. She has no wheezes. She has no rales. She exhibits no tenderness.  Abdominal: Soft. Bowel sounds are normal. She exhibits no distension and no mass. There is no tenderness.  Musculoskeletal: Normal range of motion.  Neurological: She is alert and oriented to person, place, and time.  Skin: Skin is warm and dry.  Psychiatric: She has a normal mood and affect.    CMP Latest Ref Rng & Units 01/26/2017 05/10/2016 08/29/2015  Glucose 65 - 99 mg/dL 87 409(W) 75  BUN 8 - 27 mg/dL 9 14 15   Creatinine 0.57 - 1.00  mg/dL 1.19 1.47 8.29  Sodium 134 - 144 mmol/L 142 141 138  Potassium 3.5 - 5.2 mmol/L 4.7 4.2 4.1  Chloride 96 - 106 mmol/L 102 104 103  CO2 20 - 29 mmol/L 23 28 28   Calcium 8.7 - 10.3 mg/dL 9.7 9.3 8.9  Total Protein 6.0 - 8.5 g/dL 7.4 7.7 6.9  Total Bilirubin 0.0 - 1.2 mg/dL 0.6 0.6 0.5  Alkaline Phos 39 - 117 IU/L 103 94 90  AST 0 - 40 IU/L 25 19 24   ALT 0 - 32 IU/L 14 11 15     Lipid Panel     Component Value Date/Time   CHOL 105 05/10/2016 0905   TRIG 86 05/10/2016 0905   HDL 42 (L) 05/10/2016 0905   CHOLHDL 2.5 05/10/2016 0905   VLDL 42 (H) 11/12/2014 0558   LDLCALC 83 11/12/2014 0558    Lab Results  Component Value Date   HGBA1C 8.0 04/11/2017    Assessment & Plan:   1. Uncontrolled type 2 diabetes mellitus with hyperglycemia (HCC) Uncontrolled with A1c of 8.0 Increased dose of NPH from 15 units twice daily to 20 units twice daily Diabetic diet, lifestyle modifications - POCT glucose (manual entry) - POCT glycosylated hemoglobin (Hb A1C) - insulin NPH-regular Human (NOVOLIN 70/30) (70-30) 100 UNIT/ML injection; Inject 20 Units into the skin 2 (two) times daily with a meal.  Dispense: 10 mL; Refill: 6  2. Essential hypertension, benign Uncontrolled Cannot ascertain compliance with all her medications. Advised to bring in all her bottles for reconciliation at next visit Meanwhile isosorbide added to regimen - isosorbide mononitrate (IMDUR) 30 MG 24 hr tablet; Take 1 tablet (30 mg total) by mouth daily.  Dispense: 30 tablet; Refill: 6  3. Helicobacter pylori gastritis Completed a course of triple antibiotic regimen Will repeat H. pylori breath test to evaluate for complete resolution - H. pylori breath test   Meds ordered this encounter  Medications  . insulin NPH-regular Human (NOVOLIN 70/30) (70-30) 100 UNIT/ML injection    Sig: Inject 20 Units into the skin 2 (two) times daily with a meal.    Dispense:  10 mL    Refill:  6  . isosorbide mononitrate  (IMDUR) 30 MG 24 hr tablet    Sig: Take 1 tablet (30 mg total) by mouth daily.    Dispense:  30 tablet    Refill:  6    Follow-up: Return in about 1 month (around 05/12/2017) for follow up of Hypertension and Diabetes.   Jaclyn Shaggy MD

## 2017-04-11 NOTE — Patient Instructions (Signed)
Análisis de anticuerpos a Helicobacter Pylori  (Helicobacter Pylori Antibodies Test)  ¿POR QUÉ ME DEBO REALIZAR ESTA PRUEBA?  Este es un análisis de sangre que busca detectar la presencia de bacterias llamadas Helicobacter pylori (H. pylori). H. pylori es una bacteria que se encuentra en las células que recubren el estómago. Los altos niveles de H. pylori en el estómago lo ponen en riesgo de tener úlceras estomacales y del intestino delgado, inflamación prolongada (crónica) de la mucosa gástrica o, incluso, úlceras que pueden formarse en el tubo que va desde la boca hasta el estómago (esófago). Si no se trata, la presencia de H. pylori también puede incrementar el riesgo de sufrir cáncer de estómago.  La mayoría de las personas con H. pylori en el estómago no presentan síntomas. El médico puede indicarle que se haga este análisis si tiene síntomas de úlcera estomacal o del intestino delgado, por ejemplo, dolor de estómago antes o después de comer, acidez estomacal o náuseas repetidas veces después de comer.  ¿QUÉ TIPO DE MUESTRA SE TOMA?  Para este análisis, se extrae una muestra de sangre. Por lo general, para extraerla, se introduce una aguja en una vena o se pincha un dedo con una aguja pequeña.  ¿CÓMO DEBO PREPARARME PARA ESTA PRUEBA?  No se requiere preparación para este análisis.  ¿QUÉ SON LOS VALORES DE REFERENCIA?  Los valores de referencia son los valores saludables establecidos después de realizarle el análisis a un grupo grande de personas sanas. Pueden variar entre diferentes personas, laboratorios y hospitales. Es su responsabilidad retirar el resultado del estudio. Consulte en el laboratorio o en el departamento en el que fue realizado el estudio cuándo y cómo podrá obtener los resultados.  Los resultados del análisis se informarán como positivos, negativos o dudosos. El término dudoso significa que los resultados no son positivos ni negativos.   Los valores de referencia para estos resultados son los siguientes:  · Menor o igual que 30 unidades/ml. El resultado es negativo.  · Mayor o igual que 40 unidades/ml. El resultado es positivo.  · De 30,01 a 39,99 unidades/ml. El resultado es dudoso.  ¿QUÉ SIGNIFICAN LOS RESULTADOS?  Los resultados que superan los valores normales pueden indicar la presencia de diversas enfermedades. Estas pueden incluir lo siguiente:  · Irritación a corto o largo plazo de la membrana gástrica (gastritis).  · Úlcera del intestino delgado.  · Úlcera estomacal.  · Cáncer de estómago.  Hable con el médico sobre los resultados, las opciones de tratamiento y, si es necesario, la necesidad de realizar más estudios. Hable con el médico si tiene alguna pregunta sobre los resultados.  Esta información no tiene como fin reemplazar el consejo del médico. Asegúrese de hacerle al médico cualquier pregunta que tenga.  Document Released: 01/10/2013 Document Revised: 04/12/2014 Document Reviewed: 08/03/2013  Elsevier Interactive Patient Education © 2018 [employer]

## 2017-04-12 LAB — H. PYLORI BREATH TEST: H pylori Breath Test: NEGATIVE

## 2017-04-14 ENCOUNTER — Telehealth: Payer: Self-pay

## 2017-04-14 MED FILL — ?ISOSORBIDE MN 30 MG TAB SA: 30 | 30 days supply | Qty: 30 | Fill #0

## 2017-04-14 MED FILL — ATORVASTATIN 80 MG TABLET: 80 | 30 days supply | Qty: 15 | Fill #1

## 2017-04-14 MED FILL — LISINOPRIL 40 MG TAB: 40 | 30 days supply | Qty: 30 | Fill #1

## 2017-04-14 MED FILL — GABAPENTIN 300 MG CAPSULE: 300 | 30 days supply | Qty: 180 | Fill #1

## 2017-04-14 MED FILL — ?METFORMIN HCL 1,000 MG TAB: 1000 | 30 days supply | Qty: 60 | Fill #1

## 2017-04-14 MED FILL — ?FUROSEMIDE 20 MG TABLET: 20 | 30 days supply | Qty: 30 | Fill #1

## 2017-04-14 MED FILL — FERROUS SULFATE 325 MG TAB: 325 (65 FE) | 30 days supply | Qty: 30 | Fill #2

## 2017-04-14 MED FILL — ?CLONDINE HCL 0.3 MG TABLET: 0.3 | 30 days supply | Qty: 90 | Fill #1

## 2017-04-14 NOTE — Telephone Encounter (Signed)
Pt was called and informed of lab results([id number])

## 2017-05-10 ENCOUNTER — Other Ambulatory Visit: Payer: Self-pay | Admitting: Family Medicine

## 2017-05-10 DIAGNOSIS — I10 Essential (primary) hypertension: Secondary | ICD-10-CM

## 2017-05-10 MED FILL — LISINOPRIL 40 MG TAB: 40 | 30 days supply | Qty: 30 | Fill #2

## 2017-05-10 MED FILL — ATORVASTATIN 80 MG TABLET: 80 | 30 days supply | Qty: 15 | Fill #2

## 2017-05-10 MED FILL — ?METFORMIN HCL 1,000 MG TAB: 1000 | 30 days supply | Qty: 60 | Fill #2

## 2017-05-10 MED FILL — ?FUROSEMIDE 20 MG TABLET: 20 | 30 days supply | Qty: 30 | Fill #2

## 2017-05-10 MED FILL — ?ISOSORBIDE MN 30 MG TAB SA: 30 | 30 days supply | Qty: 30 | Fill #1

## 2017-05-12 ENCOUNTER — Ambulatory Visit: Payer: Self-pay | Attending: Family Medicine | Admitting: Family Medicine

## 2017-05-12 ENCOUNTER — Encounter: Payer: Self-pay | Admitting: Family Medicine

## 2017-05-12 VITALS — BP 183/72 | HR 75 | Temp 98.2°F | Ht 61.0 in | Wt 163.4 lb

## 2017-05-12 DIAGNOSIS — Z79899 Other long term (current) drug therapy: Secondary | ICD-10-CM | POA: Insufficient documentation

## 2017-05-12 DIAGNOSIS — I10 Essential (primary) hypertension: Secondary | ICD-10-CM | POA: Insufficient documentation

## 2017-05-12 DIAGNOSIS — E1140 Type 2 diabetes mellitus with diabetic neuropathy, unspecified: Secondary | ICD-10-CM | POA: Insufficient documentation

## 2017-05-12 DIAGNOSIS — Z794 Long term (current) use of insulin: Secondary | ICD-10-CM | POA: Insufficient documentation

## 2017-05-12 DIAGNOSIS — Z7982 Long term (current) use of aspirin: Secondary | ICD-10-CM | POA: Insufficient documentation

## 2017-05-12 DIAGNOSIS — E1165 Type 2 diabetes mellitus with hyperglycemia: Secondary | ICD-10-CM | POA: Insufficient documentation

## 2017-05-12 DIAGNOSIS — Z1231 Encounter for screening mammogram for malignant neoplasm of breast: Secondary | ICD-10-CM

## 2017-05-12 DIAGNOSIS — Z1239 Encounter for other screening for malignant neoplasm of breast: Secondary | ICD-10-CM

## 2017-05-12 DIAGNOSIS — R197 Diarrhea, unspecified: Secondary | ICD-10-CM | POA: Insufficient documentation

## 2017-05-12 LAB — GLUCOSE, POCT (MANUAL RESULT ENTRY): POC GLUCOSE: 179 mg/dL — AB (ref 70–99)

## 2017-05-12 MED ORDER — ATORVASTATIN CALCIUM 80 MG PO TABS: 80.0000 mg | ORAL_TABLET | Freq: Every day | ORAL | 3 refills | Status: DC

## 2017-05-12 MED ORDER — DIPHENOXYLATE-ATROPINE 2.5-0.025 MG PO TABS: ORAL_TABLET | ORAL | 0 refills | Status: DC

## 2017-05-12 NOTE — Progress Notes (Signed)
Subjective:  Patient ID: Carol Finley, female    DOB: 1951/02/18  Age: 67 y.o. MRN: [id number]  CC: Hypertension and Diabetes   HPI Carol Finley is a 67 year old female with a history of type 2 diabetes mellitus (A1c 8.0), hypertension, diabetic neuropathy here for follow-up visit.  At her last visit her blood pressure was elevated and she was advised to bring in all her medications today.  Her blood pressure is significantly elevated today and on further questioning and reviewing her medications she did not take any of her antihypertensives but just took metformin and gabapentin with plans to take it antihypertensive after 4 hours as she states she staggers her medications.  Her NPH was increased to 20 units twice daily at her last office visit for better glycemic control and she reports fasting sugars have been controlled on was 81 this morning and her random sugar yesterday was 128.  She continues to have diarrhea which she has had for 1 week and is requesting something to help with her symptoms.  Denies nausea, vomiting, abdominal pain or fever.  Past Medical History:  Diagnosis Date  . BACTERIAL PNEUMONIA 10/09/2009   Qualifier: Diagnosis of  By: Huntley Dec, Scott    . Diabetes mellitus without complication (HCC)   . Hypertension     History reviewed. No pertinent surgical history.  No Known Allergies   Outpatient Medications Prior to Visit  Medication Sig Dispense Refill  . aspirin 81 MG tablet Take 1 tablet (81 mg total) by mouth daily. 30 tablet 5  . Blood Glucose Monitoring Suppl (TRUE METRIX METER) DEVI 1 each by Does not apply route 3 (three) times daily. 1 Device 0  . clarithromycin (BIAXIN) 500 MG tablet Take 1 tablet (500 mg total) by mouth 2 (two) times daily. 20 tablet 0  . cloNIDine (CATAPRES) 0.3 MG tablet Take 1 tablet (0.3 mg total) by mouth 3 (three) times daily. 90 tablet 11  . ferrous sulfate 325 (65 FE) MG tablet Take 1 tablet  (325 mg total) by mouth daily with breakfast. 100 tablet 1  . furosemide (LASIX) 20 MG tablet Take 1 tablet (20 mg total) by mouth daily. 30 tablet 11  . gabapentin (NEURONTIN) 300 MG capsule Take 2 capsules (600 mg total) by mouth 3 (three) times daily. 180 capsule 11  . glucose blood (TRUE METRIX BLOOD GLUCOSE TEST) test strip 1 each by Other route 3 (three) times daily. 100 each 12  . glucose blood test strip Use as instructed 100 each 12  . insulin NPH-regular Human (NOVOLIN 70/30) (70-30) 100 UNIT/ML injection Inject 20 Units into the skin 2 (two) times daily with a meal. 10 mL 6  . isosorbide mononitrate (IMDUR) 30 MG 24 hr tablet Take 1 tablet (30 mg total) by mouth daily. 30 tablet 6  . lisinopril (PRINIVIL,ZESTRIL) 40 MG tablet Take 1 tablet (40 mg total) by mouth daily. 30 tablet 11  . metFORMIN (GLUCOPHAGE) 1000 MG tablet Take 1 tablet (1,000 mg total) by mouth 2 (two) times daily with a meal. 60 tablet 11  . TRUEPLUS LANCETS 28G MISC USE AS DIRECTED 100 each 11  . atorvastatin (LIPITOR) 40 MG tablet Take 1 tablet (40 mg total) by mouth daily. 90 tablet 3  . diphenoxylate-atropine (LOMOTIL) 2.5-0.025 MG tablet 1-2 tabs up to 4 times daily for diarrhea 30 tablet 0  . amoxicillin (AMOXIL) 500 MG capsule Take 2 capsules (1,000 mg total) by mouth 2 (two) times daily. (Patient not taking: Reported on  04/11/2017) 40 capsule 0  . loratadine (CLARITIN) 10 MG tablet Take 1 tablet (10 mg total) by mouth daily. (Patient not taking: Reported on 05/10/2016) 30 tablet 11  . naproxen (NAPROSYN) 500 MG tablet Take 1 tablet (500 mg total) by mouth 2 (two) times daily as needed. (Patient not taking: Reported on 05/12/2017) 60 tablet 1  . NIFEdipine (PROCARDIA XL/ADALAT-CC) 60 MG 24 hr tablet Take 1 tablet (60 mg total) by mouth daily. (Patient not taking: Reported on 04/11/2017) 90 tablet 3  . omeprazole (PRILOSEC) 20 MG capsule TAKE 1 CAPSULE BY MOUTH 2 TIMES DAILY BEFORE A MEAL. (Patient not taking: Reported on  05/12/2017) 30 capsule 0  . ondansetron (ZOFRAN) 4 MG tablet Take 1 tablet (4 mg total) by mouth every 8 (eight) hours as needed for nausea or vomiting. (Patient not taking: Reported on 05/12/2017) 5 tablet 0  . oxymetazoline (AFRIN) 0.05 % nasal spray Place 1 spray into both nostrils 2 (two) times daily. (Patient not taking: Reported on 05/12/2017) 30 mL 0  . sodium chloride (OCEAN) 0.65 % SOLN nasal spray Place 1 spray into both nostrils as needed for congestion. (Patient not taking: Reported on 05/12/2017) 60 mL 0  . traZODone (DESYREL) 50 MG tablet Take 1 tablet (50 mg total) by mouth at bedtime as needed for sleep. (Patient not taking: Reported on 04/11/2017) 30 tablet 3   No facility-administered medications prior to visit.     ROS Review of Systems  Constitutional: Negative for activity change, appetite change and fatigue.  HENT: Negative for congestion, sinus pressure and sore throat.   Eyes: Negative for visual disturbance.  Respiratory: Negative for cough, chest tightness, shortness of breath and wheezing.   Cardiovascular: Negative for chest pain and palpitations.  Gastrointestinal: Positive for diarrhea. Negative for abdominal distention, abdominal pain and constipation.  Endocrine: Negative for polydipsia.  Genitourinary: Negative for dysuria and frequency.  Musculoskeletal: Negative for arthralgias and back pain.  Skin: Negative for rash.  Neurological: Negative for tremors, light-headedness and numbness.  Hematological: Does not bruise/bleed easily.  Psychiatric/Behavioral: Negative for agitation and behavioral problems.    Objective:  BP (!) 183/72   Pulse 75   Temp 98.2 F (36.8 C) (Oral)   Ht 5\' 1"  (1.549 m)   Wt 163 lb 6.4 oz (74.1 kg)   SpO2 97%   BMI 30.87 kg/m   BP/Weight 05/12/2017 04/11/2017 01/26/2017  Systolic BP 183 193 185  Diastolic BP 72 73 72  Wt. (Lbs) 163.4 169 165.8  BMI 30.87 31.93 31.33      Physical Exam  Constitutional: She is oriented to person,  place, and time. She appears well-developed and well-nourished.  Cardiovascular: Normal rate, normal heart sounds and intact distal pulses.  No murmur heard. Pulmonary/Chest: Effort normal and breath sounds normal. She has no wheezes. She has no rales. She exhibits no tenderness.  Abdominal: Soft. Bowel sounds are normal. She exhibits no distension and no mass. There is no tenderness.  Musculoskeletal: Normal range of motion.  Neurological: She is alert and oriented to person, place, and time.     Lab Results  Component Value Date   HGBA1C 8.0 04/11/2017    Assessment & Plan:   1. Uncontrolled type 2 diabetes mellitus with hyperglycemia (HCC) Uncontrolled with A1c of 8.0 Regimen changes were made at her last visit and so I will make none today Sugar logs reveal sugars improving Continue diabetic diet - POCT glucose (manual entry)  2. Essential hypertension, benign Uncontrolled due to not  taking antihypertensives today Compliance has been emphasized and she knows to take her daily medications at once in the mornings (with atorvastatin in the evening) and the other medications with multiple daily dosing accordingly. Counseled on blood pressure goal of less than 130/80, low-sodium, DASH diet, medication compliance, 150 minutes of moderate intensity exercise per week. Discussed medication compliance, adverse effects.  3. Screening for breast cancer - MM Digital Screening; Future  4. Diarrhea, unspecified type - diphenoxylate-atropine (LOMOTIL) 2.5-0.025 MG tablet; 1-2 tabs up to 4 times daily for diarrhea  Dispense: 30 tablet; Refill: 0   Meds ordered this encounter  Medications  . atorvastatin (LIPITOR) 80 MG tablet    Sig: Take 1 tablet (80 mg total) by mouth daily.    Dispense:  30 tablet    Refill:  3  . diphenoxylate-atropine (LOMOTIL) 2.5-0.025 MG tablet    Sig: 1-2 tabs up to 4 times daily for diarrhea    Dispense:  30 tablet    Refill:  0    Follow-up: Return in  about 3 months (around 08/09/2017) for Follow-up of chronic medical conditions.   Hoy Register MD

## 2017-05-12 NOTE — Patient Instructions (Signed)
Diabetes mellitus y nutrición  Diabetes Mellitus and Nutrition  Si sufre de diabetes (diabetes mellitus), es muy importante tener hábitos alimenticios saludables debido a que sus niveles de azúcar en la sangre (glucosa) se ven afectados en gran medida por lo que come y bebe. Comer alimentos saludables en las cantidades adecuadas, aproximadamente a la misma hora todos los días, lo ayudará a:  · Controlar la glucemia.  · Disminuir el riesgo de sufrir una enfermedad cardíaca.  · Mejorar la presión arterial.  · Alcanzar o mantener un peso saludable.    Todas las personas que sufren de diabetes son diferentes y cada una tiene necesidades diferentes en cuanto a un plan de alimentación. El médico puede recomendarle que trabaje con un especialista en dietas y nutrición (nutricionista) para elaborar el mejor plan para usted. Su plan de alimentación puede variar según factores como:  · Las calorías que necesita.  · Los medicamentos que toma.  · Su peso.  · Sus niveles de glucemia, presión arterial y colesterol.  · Su nivel de actividad.  · Otras afecciones que tenga, como enfermedades cardíacas o renales.    ¿Cómo me afectan los carbohidratos?  Los carbohidratos afectan el nivel de glucemia más que cualquier otro tipo de alimento. La ingesta de carbohidratos naturalmente aumenta la cantidad glucosa en la sangre. El recuento de carbohidratos es un método destinado a llevar un registro de la cantidad de carbohidratos que se ingieren. El recuento de carbohidratos es importante para mantener la glucemia a un nivel saludable, en especial si utiliza insulina o toma determinados medicamentos por vía oral para la diabetes.  Es importante saber la cantidad de carbohidratos que se pueden ingerir en cada comida sin correr ningún riesgo. Esto es diferente en cada persona. El nutricionista puede ayudarlo a calcular la cantidad de carbohidratos que debe ingerir en cada comida y colación.   Los alimentos que contienen carbohidratos incluyen:  · Pan, cereal, arroz, pasta y galletas.  · Papas y maíz.  · Guisantes, frijoles y lentejas.  · Leche y yogur.  · Frutas y jugo.  · Postres, como pasteles, galletitas, helado y caramelos.    ¿Cómo me afecta el alcohol?  El alcohol puede provocar disminuciones súbitas de la glucemia (hipoglucemia), en especial si utiliza insulina o toma determinados medicamentos por vía oral para la diabetes. La hipoglucemia es una afección potencialmente mortal. Los síntomas de la hipoglucemia (somnolencia, mareos y confusión) son similares a los síntomas de haber consumido demasiado alcohol.  Si el médico afirma que el alcohol es seguro para usted, siga estas pautas:  · Limite el consumo de alcohol a no más de 1 medida por día si es mujer y no está embarazada, y a 2 medidas si es hombre. Una medida equivale a 12 oz (355 ml) de cerveza, 5 oz (148 ml) de vino o 1½ oz (44 ml) de bebidas de alta graduación alcohólica.  · No beba con el estómago vacío.  · Manténgase hidratado con agua, gaseosas dietéticas o té helado sin azúcar.  · Tenga en cuenta que las gaseosas comunes, los jugos y otros refrescos pueden contener mucha azúcar y se deben contar como carbohidratos.    Consejos para seguir este plan  Leer las etiquetas de los alimentos  · Comience por controlar el tamaño de la porción en la etiqueta. La cantidad de calorías, carbohidratos, grasas y otros nutrientes mencionados en la etiqueta se basan en una porción del alimento. Muchos alimentos contienen más de una porción por envase.  · Verifique la cantidad total de gramos (g)   de carbohidratos totales en una porción. Puede calcular la cantidad de porciones de carbohidratos al dividir el total de carbohidratos por 15. Por ejemplo, si un alimento posee un total de 30 g de carbohidratos, equivale a 2 porciones de carbohidratos.  · Verifique la cantidad de gramos (g) de grasas saturadas y grasas trans  en una porción. Escoja alimentos que no contengan grasa o que tengan un bajo contenido.  · Controle la cantidad de miligramos (mg) de sodio en una porción. La mayoría de las personas deben limitar la ingesta de sodio total a menos de 2300 mg por día.  · Siempre consulte la información nutricional de los alimentos etiquetados como “con bajo contenido de grasa” o “sin grasa”. Estos alimentos pueden ser más altos en azúcar agregada o en carbohidratos refinados y deben evitarse.  · Hable con el nutricionista para identificar sus objetivos diarios en cuanto a los nutrientes mencionados en la etiqueta.  De compras  · Evite comprar alimentos procesados, enlatados o prehechos. Estos alimentos tienden a tener mayor cantidad de grasa, sodio y azúcar agregada.  · Compre en la zona exterior de la tienda de comestibles. Esta incluye frutas y vegetales frescos, granos a granel, carnes frescas y productos lácteos frescos.  Cocción  · Utilice métodos de cocción a baja temperatura, como hornear, en lugar de métodos de cocción a alta temperatura, como freír en abundante aceite.  · Cocine con aceites saludables, como el aceite de oliva, canola o girasol.  · Evite cocinar con manteca, crema o carnes con alto contenido de grasa.  Planificación de las comidas  · Consuma las comidas y las colaciones de forma regular, preferentemente a la misma hora todos los días. Evite pasar largos períodos de tiempo sin comer.  · Consuma alimentos ricos en fibra, como frutas frescas, verduras, frijoles y cereales integrales. Consulte al nutricionista sobre cuántas porciones de carbohidratos puede consumir en cada comida.  · Consuma entre 4 y 6 onzas de proteínas magras por día, como carnes magras, pollo, pescado, huevos o tofu. 1 onza equivale a 1 onza de carne, pollo o pescado, 1 huevo, o 1/4 taza de tofu.  · Coma algunos alimentos por día que contengan grasas saludables, como aguacates, frutos secos, semillas y pescado.  Estilo de vida     · Controle su nivel de glucemia con regularidad.  · Haga ejercicio al menos 30 minutos, 5 días o más por semana, o como se lo haya indicado el médico.  · Tome los medicamentos como se lo haya indicado el médico.  · No consuma ningún producto que contenga nicotina o tabaco, como cigarrillos y cigarrillos electrónicos. Si necesita ayuda para dejar de fumar, consulte al médico.  · Trabaje con un asesor o instructor en diabetes para identificar estrategias para controlar el estrés y cualquier desafío emocional y social.  ¿Cuáles son algunas de las preguntas que puedo hacerle a mi médico?  · ¿Es necesario que me reúna con un instructor en diabetes?  · ¿Es necesario que me reúna con un nutricionista?  · ¿A qué número puedo llamar si tengo preguntas?  · ¿Cuáles son los mejores momentos para controlar la glucemia?  Dónde encontrar más información:  · Asociación Americana de la Diabetes (American Diabetes Association): diabetes.org/food-and-fitness/food  · Academia de Nutrición y Dietética (Academy of Nutrition and Dietetics): www.eatright.org/resources/health/diseases-and-conditions/diabetes  · Instituto Nacional de la Diabetes y las Enfermedades Digestivas y Renales (National Institute of Diabetes and Digestive and Kidney Diseases) (Institutos Nacionales de Salud, NIH): www.niddk.nih.gov/health-information/diabetes/overview/diet-eating-physical-activity  Resumen  · Un plan de alimentación saludable   lo ayudará a controlar la glucemia y mantener un estilo de vida saludable.  · Trabajar con un especialista en dietas y nutrición (nutricionista) puede ayudarlo a elaborar el mejor plan de alimentación para usted.  · Tenga en cuenta que los carbohidratos y el alcohol tienen efectos inmediatos en sus niveles de glucemia. Es importante contar los carbohidratos y consumir alcohol con prudencia.  Esta información no tiene como fin reemplazar el consejo del médico. Asegúrese de hacerle al médico cualquier pregunta que tenga.   Document Released: 06/29/2007 Document Revised: 07/12/2016 Document Reviewed: 07/12/2016  Elsevier Interactive Patient Education © 2018 Elsevier Inc.

## 2017-05-13 ENCOUNTER — Other Ambulatory Visit: Payer: Self-pay | Admitting: Family Medicine

## 2017-05-13 NOTE — Telephone Encounter (Signed)
Patient requested for medications sent on most recent visit be redirected to [facility] pharmacy due to [facility] not accepting her id. Please call patient to notify her of change. [phone]

## 2017-05-17 ENCOUNTER — Other Ambulatory Visit: Payer: Self-pay | Admitting: Family Medicine

## 2017-05-17 NOTE — Telephone Encounter (Signed)
Medications from her last visit were sent to [facility] pharmacy

## 2017-05-17 NOTE — Telephone Encounter (Signed)
Left a vm on patients phone

## 2017-05-18 MED ORDER — "INSULIN SYRINGE-NEEDLE U-100 31G X 15/64"" 0.5 ML MISC": 2 refills | Status: AC

## 2017-05-18 NOTE — Telephone Encounter (Signed)
Syringes sent to pharmacy.

## 2017-05-18 NOTE — Telephone Encounter (Signed)
Pt. Came to facility requesting a refill on her syringes for her insulin. Pt. Uses [facility] pharmacy. Please f/u with pt.

## 2017-05-27 ENCOUNTER — Other Ambulatory Visit: Payer: Self-pay | Admitting: Family Medicine

## 2017-05-27 DIAGNOSIS — Z1239 Encounter for other screening for malignant neoplasm of breast: Secondary | ICD-10-CM

## 2017-05-27 DIAGNOSIS — Z1231 Encounter for screening mammogram for malignant neoplasm of breast: Secondary | ICD-10-CM

## 2017-06-01 ENCOUNTER — Ambulatory Visit: Payer: Self-pay | Attending: Family Medicine | Admitting: Physician Assistant

## 2017-06-01 VITALS — BP 129/66 | HR 57 | Temp 98.2°F | Resp 16 | Wt 163.0 lb

## 2017-06-01 DIAGNOSIS — H1131 Conjunctival hemorrhage, right eye: Secondary | ICD-10-CM

## 2017-06-01 DIAGNOSIS — J0100 Acute maxillary sinusitis, unspecified: Secondary | ICD-10-CM

## 2017-06-01 DIAGNOSIS — Z7982 Long term (current) use of aspirin: Secondary | ICD-10-CM | POA: Insufficient documentation

## 2017-06-01 DIAGNOSIS — E1165 Type 2 diabetes mellitus with hyperglycemia: Secondary | ICD-10-CM

## 2017-06-01 DIAGNOSIS — Z79899 Other long term (current) drug therapy: Secondary | ICD-10-CM | POA: Insufficient documentation

## 2017-06-01 DIAGNOSIS — Z794 Long term (current) use of insulin: Secondary | ICD-10-CM | POA: Insufficient documentation

## 2017-06-01 DIAGNOSIS — I10 Essential (primary) hypertension: Secondary | ICD-10-CM | POA: Insufficient documentation

## 2017-06-01 LAB — GLUCOSE, POCT (MANUAL RESULT ENTRY): POC GLUCOSE: 175 mg/dL — AB (ref 70–99)

## 2017-06-01 MED ORDER — FLUCONAZOLE 150 MG PO TABS: 150.0000 mg | ORAL_TABLET | Freq: Once | ORAL | 0 refills | Status: AC

## 2017-06-01 MED ORDER — AMOXICILLIN 500 MG PO CAPS: 500.0000 mg | ORAL_CAPSULE | Freq: Three times a day (TID) | ORAL | 0 refills | Status: DC

## 2017-06-01 MED FILL — FLUCONAZOLE 150 MG TABLET: 150 | 1 days supply | Qty: 1 | Fill #0

## 2017-06-01 MED FILL — AMOXICILLIN 500 MG CAPSULE: 500 | 10 days supply | Qty: 30 | Fill #0

## 2017-06-01 NOTE — Progress Notes (Signed)
Patient ID: Carol Finley, female   DOB: 1950/12/16, 67 y.o.   MRN: [id number]     Carol Finley, is a 67 y.o. female  NFA:[id number]  ION:[id number]  DOB - 1950-07-30  Subjective:  Chief Complaint and HPI: Carol Finley is a 67 y.o. female here today for L Red eye X 3days the R eye became red.  Eyes are a little tender.  +HA.  +sneezing, coughing, and runny nose.  No fever. R eye became red after a 30 minute fit of harsh sneezing.  No vision changes.  She has been sick for about 10 days.  +sinus pressure and mucus is yellowish.    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   ROS:   Constitutional:  No f/c, No night sweats, No unexplained weight loss. EENT:  No vision changes, No blurry vision, No hearing changes. + sneezing and runny nose.  + sinus pressure  Respiratory: + cough, No SOB Cardiac: No CP, no palpitations GI:  No abd pain, No N/V/D. GU: No Urinary s/sx Musculoskeletal: No joint pain Neuro: + sinus headache, no dizziness, no motor weakness.  Skin: No rash Endocrine:  No polydipsia. No polyuria.  Psych: Denies SI/HI  No problems updated.  ALLERGIES: No Known Allergies  PAST MEDICAL HISTORY: Past Medical History:  Diagnosis Date  . BACTERIAL PNEUMONIA 10/09/2009   Qualifier: Diagnosis of  By: Huntley Dec, Scott    . Diabetes mellitus without complication (HCC)   . Hypertension     MEDICATIONS AT HOME: Prior to Admission medications   Medication Sig Start Date End Date Taking? Authorizing Provider  amoxicillin (AMOXIL) 500 MG capsule Take 1 capsule (500 mg total) by mouth 3 (three) times daily. 06/01/17   Anders Simmonds, PA-C  aspirin 81 MG tablet Take 1 tablet (81 mg total) by mouth daily. 11/19/14   Casey Burkitt, MD  atorvastatin (LIPITOR) 80 MG tablet Take 1 tablet (80 mg total) by mouth daily. 05/12/17   Hoy Register, MD  Blood Glucose Monitoring Suppl (TRUE METRIX METER) DEVI 1 each by Does not  apply route 3 (three) times daily. 04/11/17   Hoy Register, MD  clarithromycin (BIAXIN) 500 MG tablet Take 1 tablet (500 mg total) by mouth 2 (two) times daily. 01/31/17   Anders Simmonds, PA-C  cloNIDine (CATAPRES) 0.3 MG tablet Take 1 tablet (0.3 mg total) by mouth 3 (three) times daily. 04/29/16   McKeag, Janine Ores, MD  diphenoxylate-atropine (LOMOTIL) 2.5-0.025 MG tablet 1-2 tabs up to 4 times daily for diarrhea 05/12/17   Hoy Register, MD  ferrous sulfate 325 (65 FE) MG tablet Take 1 tablet (325 mg total) by mouth daily with breakfast. 01/31/17   Anders Simmonds, PA-C  fluconazole (DIFLUCAN) 150 MG tablet Take 1 tablet (150 mg total) by mouth once for 1 dose. 06/01/17 06/01/17  Anders Simmonds, PA-C  furosemide (LASIX) 20 MG tablet Take 1 tablet (20 mg total) by mouth daily. 11/03/16   Casey Burkitt, MD  gabapentin (NEURONTIN) 300 MG capsule Take 2 capsules (600 mg total) by mouth 3 (three) times daily. 08/25/16   McKeag, Janine Ores, MD  glucose blood (TRUE METRIX BLOOD GLUCOSE TEST) test strip 1 each by Other route 3 (three) times daily. 04/11/17   Hoy Register, MD  glucose blood test strip Use as instructed 08/08/12   Garnetta Buddy, MD  insulin NPH-regular Human (NOVOLIN 70/30) (70-30) 100 UNIT/ML injection Inject 20 Units into the skin 2 (two) times daily with a meal. 04/11/17  Hoy Register, MD  Insulin Syringe-Needle U-100 (BD INSULIN SYRINGE ULTRAFINE) 31G X 15/64" 0.5 ML MISC Use as directed 05/18/17   Hoy Register, MD  isosorbide mononitrate (IMDUR) 30 MG 24 hr tablet Take 1 tablet (30 mg total) by mouth daily. 04/11/17   Hoy Register, MD  lisinopril (PRINIVIL,ZESTRIL) 40 MG tablet Take 1 tablet (40 mg total) by mouth daily. 11/03/16   Casey Burkitt, MD  loratadine (CLARITIN) 10 MG tablet Take 1 tablet (10 mg total) by mouth daily. Patient not taking: Reported on 05/10/2016 06/11/14   Street, Stephanie Coup, MD  metFORMIN (GLUCOPHAGE) 1000 MG tablet Take 1 tablet (1,000  mg total) by mouth 2 (two) times daily with a meal. 06/29/16   Hoy Register, MD  naproxen (NAPROSYN) 500 MG tablet Take 1 tablet (500 mg total) by mouth 2 (two) times daily as needed. Patient not taking: Reported on 05/12/2017 01/26/17   Anders Simmonds, PA-C  NIFEdipine (PROCARDIA XL/ADALAT-CC) 60 MG 24 hr tablet Take 1 tablet (60 mg total) by mouth daily. Patient not taking: Reported on 04/11/2017 12/05/16   Casey Burkitt, MD  omeprazole (PRILOSEC) 20 MG capsule TAKE 1 CAPSULE BY MOUTH 2 TIMES DAILY BEFORE A MEAL. Patient not taking: Reported on 05/12/2017 03/16/17   Hoy Register, MD  ondansetron (ZOFRAN) 4 MG tablet Take 1 tablet (4 mg total) by mouth every 8 (eight) hours as needed for nausea or vomiting. Patient not taking: Reported on 05/12/2017 09/06/16   Bonney Aid, MD  oxymetazoline (AFRIN) 0.05 % nasal spray Place 1 spray into both nostrils 2 (two) times daily. Patient not taking: Reported on 05/12/2017 07/13/16   Ardith Dark, MD  sodium chloride (OCEAN) 0.65 % SOLN nasal spray Place 1 spray into both nostrils as needed for congestion. Patient not taking: Reported on 05/12/2017 07/13/16   Ardith Dark, MD  traZODone (DESYREL) 50 MG tablet Take 1 tablet (50 mg total) by mouth at bedtime as needed for sleep. Patient not taking: Reported on 04/11/2017 05/10/16   Hoy Register, MD  TRUEPLUS LANCETS 28G MISC USE AS DIRECTED 04/11/17   Hoy Register, MD     Objective:  EXAM:   Vitals:   06/01/17 0934  BP: 129/66  Pulse: (!) 57  Resp: 16  Temp: 98.2 F (36.8 C)  TempSrc: Oral  SpO2: 96%  Weight: 163 lb (73.9 kg)    General appearance : A&OX3. NAD. Non-toxic-appearing HEENT: Atraumatic and Normocephalic.  PERRLA. EOM intact. Fundi benign.  There  is a subconjunctival hemorrhage of the R eye.  No conjunctivitis. TM full B.  Mild maxillary sinus TTP.   Mouth-MMM, post pharynx WNL w/o erythema, No PND. Neck: supple, no JVD. No cervical lymphadenopathy. No  thyromegaly Chest/Lungs:  Breathing-non-labored, Good air entry bilaterally, breath sounds normal without rales, rhonchi, or wheezing  CVS: S1 S2 regular, no murmurs, gallops, rubs  Extremities: Bilateral Lower Ext shows no edema, both legs are warm to touch with = pulse throughout Neurology:  CN II-XII grossly intact, Non focal.   Psych:  TP linear. J/I WNL. Normal speech. Appropriate eye contact and affect.  Skin:  No Rash  Data Review Lab Results  Component Value Date   HGBA1C 8.0 04/11/2017   HGBA1C 8.2 01/26/2017   HGBA1C 7.6 08/19/2016     Assessment & Plan   1. Acute non-recurrent maxillary sinusitis - amoxicillin (AMOXIL) 500 MG capsule; Take 1 capsule (500 mg total) by mouth 3 (three) times daily.  Dispense: 30 capsule; Refill:  0 - fluconazole (DIFLUCAN) 150 MG tablet; Take 1 tablet (150 mg total) by mouth once for 1 dose.  Dispense: 1 tablet; Refill: 0  2. Uncontrolled type 2 diabetes mellitus with hyperglycemia (HCC) Adequate control-continue current regimen - Glucose (CBG)  3. Subconjunctival hemorrhage of right eye Vision is about at baseline Anticipatory care/supportive care.    Patient have been counseled extensively about nutrition and exercise  Return for keep 08/10/2017 appt with Dr Alvis Lemmings.  The patient was given clear instructions to go to ER or return to medical center if symptoms don't improve, worsen or new problems develop. The patient verbalized understanding. The patient was told to call to get lab results if they haven't heard anything in the next week.     Georgian Co, PA-C [facility] and [facility], [place] [phone]   06/01/2017, 9:35 AM

## 2017-06-01 NOTE — Patient Instructions (Signed)
Hemorragia subconjuntival  (Subconjunctival Hemorrhage)  La hemorragia subconjuntival es el sangrado que se produce entre la parte blanca del ojo (esclertica) y la membrana transparente que recubre la parte externa de este rgano (conjuntiva). Cerca de la superficie del ojo hay muchos vasos sanguneos diminutos. La hemorragia subconjuntival ocurre cuando uno o ms de estos vasos sanguneos se rompen y sangran, lo que deriva en la aparicin de una mancha roja en el ojo. Esta es similar a un hematoma.  En funcin de la magnitud del sangrado, la mancha roja puede cubrir nicamente una pequea zona del ojo o la totalidad de la parte visible de la esclertica. Si se acumula mucha sangre debajo de la conjuntiva, tambin puede haber inflamacin. Las hemorragias subconjuntivales no afectan la visin ni causan dolor, pero puede haber sensacin de irritacin ocular si hay inflamacin. Generalmente, las hemorragias subconjuntivales no requieren tratamiento y desaparecen solas en el trmino de dos semanas.  CAUSAS  Esta afeccin puede ser causada por lo siguiente:   Un traumatismo leve, como frotarse los ojos con mucha fuerza.   Un traumatismo grave o una contusin.   Toser, estornudar o vomitar.   Realizar esfuerzos, como ocurre al levantar un objeto pesado.   Hipertensin arterial.   Una ciruga ocular reciente.   Antecedentes de diabetes.   Algunos medicamentos, especialmente los anticoagulantes.   Otras afecciones, como los tumores en los ojos, los trastornos hemorrgicos o las anomalas de los vasos sanguneos.  Las hemorragias subconjuntivales pueden producirse sin una causa aparente.  SNTOMAS  Los sntomas de esta afeccin incluyen lo siguiente:   Una mancha de color rojo oscuro o brillante en la parte blanca del ojo.  ? La zona enrojecida se puede extender hasta cubrir un rea ms grande del ojo antes de desaparecer.  ? La zona enrojecida puede tornarse de color marrn amarillento antes de  desaparecer.   Hinchazn.   Irritacin leve del ojo.  DIAGNSTICO  Esta afeccin se diagnostica mediante un examen fsico. Si la hemorragia subconjuntival fue causada por un traumatismo, el mdico puede derivarlo a un oculista (oftalmlogo) o a otro especialista para que lo examinen en busca de otras lesiones. Pueden hacerle otros estudios, por ejemplo:   Un examen ocular.   Un control de la presin arterial.   Anlisis de sangre para detectar la presencia de trastornos hemorrgicos.  Si la hemorragia subconjuntival fue causada por un traumatismo, pueden hacerle radiografas o una tomografa computarizada (TC) para determinar si hay otras lesiones.  TRATAMIENTO  Por lo general, no se necesita tratamiento. El mdico puede recomendarle que se aplique gotas oftlmicas o compresas fras para aliviar las molestias.  INSTRUCCIONES PARA EL CUIDADO EN EL HOGAR   Tome los medicamentos de venta libre y los recetados solamente como se lo haya indicado el mdico.   Aplique las gotas oftlmicas o las compresas fras para aliviar las molestias como se lo haya indicado el mdico.   Evite las actividades, las cosas y los entornos que pueden causarle irritacin o lesiones en el ojo.   Concurra a todas las visitas de control como se lo haya indicado el mdico. Esto es importante.  SOLICITE ATENCIN MDICA SI:   Siente dolor en el ojo.   El sangrado no desaparece en el trmino de 3semanas.   Sigue teniendo hemorragias subconjuntivales.  SOLICITE ATENCIN MDICA DE INMEDIATO SI:   Tiene cambios en la visin o dificultad para ver.   Repentinamente, tiene mucha sensibilidad a la luz.   Tiene dolor de cabeza intenso,   vmitos persistentes, confusin o un cansancio que no es normal (letargia).   Parece que el ojo sobresale o se protruye de la rbita.   Le aparecen hematomas en el cuerpo sin motivo.   Tiene sangrado en otra parte del cuerpo sin motivo.  Esta informacin no tiene como fin reemplazar el consejo del mdico.  Asegrese de hacerle al mdico cualquier pregunta que tenga.  Document Released: 12/30/2004 Document Revised: 12/11/2014 Document Reviewed: 05/29/2014  Elsevier Interactive Patient Education  2018 Elsevier Inc.

## 2017-06-07 MED FILL — ?METFORMIN HCL 1,000 MG TAB: 1000 | 30 days supply | Qty: 60 | Fill #3

## 2017-06-07 MED FILL — ?FUROSEMIDE 20 MG TABLET: 20 | 30 days supply | Qty: 30 | Fill #3

## 2017-06-07 MED FILL — LISINOPRIL 40 MG TAB: 40 | 30 days supply | Qty: 30 | Fill #3

## 2017-06-07 MED FILL — GABAPENTIN 300 MG CAPSULE: 300 | 30 days supply | Qty: 180 | Fill #2

## 2017-06-07 MED FILL — ATORVASTATIN 80 MG TABLET: 80 | 30 days supply | Qty: 15 | Fill #3

## 2017-06-07 MED FILL — ISOSORBIDE MN ER 30 MG TAB: 30 | 30 days supply | Qty: 30 | Fill #2

## 2017-06-08 ENCOUNTER — Other Ambulatory Visit: Payer: Self-pay

## 2017-06-08 DIAGNOSIS — E1165 Type 2 diabetes mellitus with hyperglycemia: Secondary | ICD-10-CM

## 2017-06-08 MED ORDER — INSULIN NPH ISOPHANE & REGULAR (70-30) 100 UNIT/ML ~~LOC~~ SUSP: 20.0000 [IU] | Freq: Two times a day (BID) | SUBCUTANEOUS | 6 refills | Status: DC

## 2017-06-09 ENCOUNTER — Telehealth: Payer: Self-pay | Admitting: Family Medicine

## 2017-06-09 ENCOUNTER — Other Ambulatory Visit: Payer: Self-pay

## 2017-06-09 DIAGNOSIS — I10 Essential (primary) hypertension: Secondary | ICD-10-CM

## 2017-06-09 MED ORDER — CLONIDINE HCL 0.3 MG PO TABS: 0.3000 mg | ORAL_TABLET | Freq: Three times a day (TID) | ORAL | 6 refills | Status: DC

## 2017-06-09 MED FILL — ?HUMULIN N 100 UNITS/ML VIA: 100 | 29 days supply | Qty: 10 | Fill #0

## 2017-06-09 NOTE — Telephone Encounter (Signed)
Pt came in to request a refill on  -cloNIDine (CATAPRES) 0.3 MG tablet  Please follow up If approved please send to [facility]

## 2017-06-09 NOTE — Telephone Encounter (Signed)
Refilled

## 2017-06-09 NOTE — Addendum Note (Signed)
Addended by: Hoy Register on: 06/09/2017 02:52 PM   Modules accepted: Orders

## 2017-06-10 ENCOUNTER — Other Ambulatory Visit: Payer: Self-pay

## 2017-06-14 ENCOUNTER — Other Ambulatory Visit: Payer: Self-pay | Admitting: Pharmacist

## 2017-06-14 DIAGNOSIS — I10 Essential (primary) hypertension: Secondary | ICD-10-CM

## 2017-06-14 MED ORDER — CLONIDINE HCL 0.3 MG PO TABS: 0.3000 mg | ORAL_TABLET | Freq: Three times a day (TID) | ORAL | 6 refills | Status: DC

## 2017-06-14 MED FILL — ?CLONDINE HCL 0.3 MG TABLET: 0.3 | 30 days supply | Qty: 90 | Fill #0

## 2017-06-20 ENCOUNTER — Ambulatory Visit: Payer: Self-pay | Attending: Family Medicine

## 2017-06-28 ENCOUNTER — Ambulatory Visit: Payer: Self-pay

## 2017-07-06 ENCOUNTER — Other Ambulatory Visit: Payer: Self-pay | Admitting: Family Medicine

## 2017-07-06 DIAGNOSIS — E119 Type 2 diabetes mellitus without complications: Secondary | ICD-10-CM

## 2017-07-06 MED FILL — FERROUS SULFATE 325 MG TAB: 325 (65 FE) | 30 days supply | Qty: 30 | Fill #3

## 2017-07-06 MED FILL — TRUE METRIX TEST STRIP: 30 days supply | Qty: 100 | Fill #1

## 2017-07-06 MED FILL — metFORMIN HCL 1000 MG TABS: 1000 | 30 days supply | Qty: 60 | Fill #0

## 2017-07-06 MED FILL — GABAPENTIN 300 MG CAPSULE: 300 | 30 days supply | Qty: 180 | Fill #3

## 2017-07-06 MED FILL — FUROSEMIDE 20 MG TABLET: 20 | 30 days supply | Qty: 30 | Fill #4

## 2017-07-06 MED FILL — ATORVASTATIN 80 MG TABLET: 80 | 30 days supply | Qty: 30 | Fill #0

## 2017-07-06 MED FILL — cloNIDine HCL 0.3 MG TABS: 0.3 | 30 days supply | Qty: 90 | Fill #1

## 2017-07-06 MED FILL — LISINOPRIL 40 MG TAB: 40 | 30 days supply | Qty: 30 | Fill #4

## 2017-07-06 MED FILL — TRUEPLUS SYR 0.5ML 31GX5/16: 31G X 5/16" | 30 days supply | Qty: 100 | Fill #0

## 2017-07-13 ENCOUNTER — Ambulatory Visit
Admission: RE | Admit: 2017-07-13 | Discharge: 2017-07-13 | Disposition: A | Discharge status: Home / Self Care | Payer: No Typology Code available for payment source | Source: Ambulatory Visit | Attending: Family Medicine | Admitting: Family Medicine

## 2017-07-13 DIAGNOSIS — Z1231 Encounter for screening mammogram for malignant neoplasm of breast: Secondary | ICD-10-CM

## 2017-07-18 ENCOUNTER — Other Ambulatory Visit: Payer: Self-pay

## 2017-07-18 DIAGNOSIS — E119 Type 2 diabetes mellitus without complications: Secondary | ICD-10-CM

## 2017-07-18 MED ORDER — TRUEPLUS LANCETS 28G MISC: 11 refills | Status: DC

## 2017-07-18 MED ORDER — TRUE METRIX METER DEVI: 1.0000 | Freq: Three times a day (TID) | 0 refills | Status: DC

## 2017-07-18 MED ORDER — GLUCOSE BLOOD VI STRP: ORAL_STRIP | 12 refills | Status: DC

## 2017-07-18 MED ORDER — GLUCOSE BLOOD VI STRP: 1.0000 | ORAL_STRIP | Freq: Three times a day (TID) | 12 refills | Status: DC

## 2017-07-18 MED FILL — TRUEplus LANCETS 28G MISC: 25 days supply | Qty: 100 | Fill #0

## 2017-07-18 MED FILL — TRUE METRIX TEST STRIP: 30 days supply | Qty: 100 | Fill #0

## 2017-07-19 MED FILL — ?HUMULIN N 100 UNITS/ML VIA: 100 | 29 days supply | Qty: 10 | Fill #1

## 2017-07-23 ENCOUNTER — Ambulatory Visit (HOSPITAL_COMMUNITY)
Admission: EM | Admit: 2017-07-23 | Discharge: 2017-07-23 | Disposition: A | Discharge status: Home / Self Care | Payer: No Typology Code available for payment source | Attending: Family Medicine | Admitting: Family Medicine

## 2017-07-23 ENCOUNTER — Encounter (HOSPITAL_COMMUNITY): Payer: Self-pay | Admitting: Emergency Medicine

## 2017-07-23 DIAGNOSIS — G720 Drug-induced myopathy: Secondary | ICD-10-CM

## 2017-07-23 DIAGNOSIS — T466X5A Adverse effect of antihyperlipidemic and antiarteriosclerotic drugs, initial encounter: Secondary | ICD-10-CM

## 2017-07-23 NOTE — ED Provider Notes (Signed)
  [facility]    CSN: [id number] Arrival date & time: 07/23/17  1759  Musculoskeletal Exam  Patient: Carol Finley DOB: December 10, 1950  DOS: 07/23/2017  SUBJECTIVE:  Chief Complaint:   Chief Complaint  Patient presents with  . Knee Pain  . Elbow Pain    Carol Finley is a 67 y.o.  female for evaluation and treatment of UE and LE pain.   Onset:  2 days ago.  No injury or change in activity. Character:  aching  Progression of issue:  is unchanged Associated symptoms: None Treatment: to date has been OTC NSAIDS and acetaminophen.   Neurovascular symptoms: no  ROS: Musculoskeletal/Extremities: +limb pain  Past Medical History:  Diagnosis Date  . BACTERIAL PNEUMONIA 10/09/2009   Qualifier: Diagnosis of  By: Huntley Dec, Scott    . Diabetes mellitus without complication (HCC)   . Hypertension     Objective: VITAL SIGNS: BP (!) 179/57   Pulse 75   Temp 98.4 F (36.9 C)   Resp 16   SpO2 97%  Constitutional: Well formed, well developed. No acute distress. Cardiovascular: Brisk cap refill Thorax & Lungs: No accessory muscle use Musculoskeletal: She has diffuse tenderness to palpation over the muscles of the forearm, upper arm, thigh, and lower extremity bilaterally; there is no joint warmth, effusion, or erythema in any extremity Neurologic: Normal sensory function. No focal deficits noted.  Psychiatric: Normal mood. Age appropriate judgment and insight. Alert & oriented x 3.    Assessment:  Statin myopathy  Plan: She has a strange exam for to be an isolated muscular skeletal injury.  I see that she has a 80 mg of Lipitor daily.  This is likely the cause in the absence of an injury or change in activity with diffuse pain like this coupled with a nonspecific exam. Hold Lipitor for 4 weeks.  Stay hydrated. F/u with PCP in 4 weeks. The patient voiced understanding and agreement to the plan.    Sharlene Dory, [place] 07/23/17  2125

## 2017-07-23 NOTE — Discharge Instructions (Signed)
Stop the Lipitor (atorvastatin) until you follow up with Dr. Alvis Lemmings. Stay hydrated with water.

## 2017-07-23 NOTE — ED Triage Notes (Signed)
Pt c/o bilateral knee and elbow pain, states "its the bones". x2 days. Denies injury.

## 2017-07-25 ENCOUNTER — Telehealth: Payer: Self-pay

## 2017-07-25 NOTE — Telephone Encounter (Signed)
Patient was called and informed of lab results via interpretor.([id number]) 

## 2017-08-08 MED FILL — LISINOPRIL 40 MG TABLET: 40 | 30 days supply | Qty: 30 | Fill #5

## 2017-08-08 MED FILL — FUROSEMIDE 20 MG TABLET: 20 | 30 days supply | Qty: 30 | Fill #5

## 2017-08-08 MED FILL — GABAPENTIN 300 MG CAPSULE: 300 | 30 days supply | Qty: 180 | Fill #4

## 2017-08-08 MED FILL — ?METFORMIN HCL 1,000 MG TAB: 1000 | 30 days supply | Qty: 60 | Fill #1

## 2017-08-08 MED FILL — FERROUS SULFATE 325 MG TAB: 325 (65 FE) | 30 days supply | Qty: 30 | Fill #4

## 2017-08-08 MED FILL — ?CLONDINE HCL 0.3 MG TABLET: 0.3 | 30 days supply | Qty: 90 | Fill #2

## 2017-08-10 ENCOUNTER — Encounter: Payer: Self-pay | Admitting: Family Medicine

## 2017-08-10 ENCOUNTER — Ambulatory Visit: Payer: Self-pay | Attending: Family Medicine | Admitting: Family Medicine

## 2017-08-10 VITALS — BP 138/70 | HR 58 | Temp 98.4°F | Ht 61.0 in | Wt 165.6 lb

## 2017-08-10 DIAGNOSIS — I119 Hypertensive heart disease without heart failure: Secondary | ICD-10-CM | POA: Insufficient documentation

## 2017-08-10 DIAGNOSIS — E785 Hyperlipidemia, unspecified: Secondary | ICD-10-CM | POA: Insufficient documentation

## 2017-08-10 DIAGNOSIS — E1149 Type 2 diabetes mellitus with other diabetic neurological complication: Secondary | ICD-10-CM

## 2017-08-10 DIAGNOSIS — E1140 Type 2 diabetes mellitus with diabetic neuropathy, unspecified: Secondary | ICD-10-CM | POA: Insufficient documentation

## 2017-08-10 DIAGNOSIS — Z794 Long term (current) use of insulin: Secondary | ICD-10-CM | POA: Insufficient documentation

## 2017-08-10 DIAGNOSIS — Z7982 Long term (current) use of aspirin: Secondary | ICD-10-CM | POA: Insufficient documentation

## 2017-08-10 DIAGNOSIS — Z79899 Other long term (current) drug therapy: Secondary | ICD-10-CM | POA: Insufficient documentation

## 2017-08-10 DIAGNOSIS — I5189 Other ill-defined heart diseases: Secondary | ICD-10-CM

## 2017-08-10 DIAGNOSIS — I10 Essential (primary) hypertension: Secondary | ICD-10-CM

## 2017-08-10 DIAGNOSIS — E1165 Type 2 diabetes mellitus with hyperglycemia: Secondary | ICD-10-CM | POA: Insufficient documentation

## 2017-08-10 LAB — POCT GLYCOSYLATED HEMOGLOBIN (HGB A1C): Hemoglobin A1C: 8.7

## 2017-08-10 LAB — GLUCOSE, POCT (MANUAL RESULT ENTRY): POC Glucose: 101 mg/dl — AB (ref 70–99)

## 2017-08-10 MED ORDER — GABAPENTIN 300 MG PO CAPS: 600.0000 mg | ORAL_CAPSULE | Freq: Three times a day (TID) | ORAL | 6 refills | Status: AC

## 2017-08-10 MED ORDER — ISOSORBIDE MONONITRATE ER 30 MG PO TB24: 30.0000 mg | ORAL_TABLET | Freq: Every day | ORAL | 6 refills | Status: DC

## 2017-08-10 MED ORDER — ATORVASTATIN CALCIUM 80 MG PO TABS: 80.0000 mg | ORAL_TABLET | Freq: Every day | ORAL | 6 refills | Status: DC

## 2017-08-10 MED ORDER — FUROSEMIDE 20 MG PO TABS: 20.0000 mg | ORAL_TABLET | Freq: Every day | ORAL | 6 refills | Status: DC

## 2017-08-10 MED ORDER — INSULIN NPH ISOPHANE & REGULAR (70-30) 100 UNIT/ML ~~LOC~~ SUSP: 20.0000 [IU] | Freq: Two times a day (BID) | SUBCUTANEOUS | 6 refills | Status: DC

## 2017-08-10 MED ORDER — CLONIDINE HCL 0.3 MG PO TABS: 0.3000 mg | ORAL_TABLET | Freq: Three times a day (TID) | ORAL | 6 refills | Status: DC

## 2017-08-10 MED ORDER — LISINOPRIL 40 MG PO TABS: 40.0000 mg | ORAL_TABLET | Freq: Every day | ORAL | 6 refills | Status: DC

## 2017-08-10 MED ORDER — METFORMIN HCL 1000 MG PO TABS: 1000.0000 mg | ORAL_TABLET | Freq: Two times a day (BID) | ORAL | 6 refills | Status: AC

## 2017-08-10 NOTE — Progress Notes (Signed)
Subjective:  Patient ID: Carol Finley, female    DOB: [birth year]  Age: 67 y.o. MRN: [id number]  CC: Diabetes   HPI Carol Finley is a 67 year old female with a history of type 2 diabetes mellitus (A1c 8.7), hypertension, hyperlipidemia, diabetic neuropathy, diastolic dysfunction (EF 55 to 60% from echo 11/2014) here for follow-up visit. She had an urgent care visit 2 weeks ago where she had presented with elbow and knee pains and her statin was placed on hold as symptoms were thought to be due to statin myopathy.  She presents today reporting complete resolution of knee pains and elbow pains and has been off her Lipitor.  Endorses compliance with her antihypertensive and denies adverse effects. Her A1c is 8.7 and has trended up from 8.0 previously and she informs me she takes 15 units of NPH twice daily but her chart reveals she should be on 20 units twice daily. Her neuropathy is controlled on gabapentin. She denies shortness of breath, chest pains at this time but has had episodes in the past where she has felt a "pinching sensation" in her chest wall. She has no concerns today.  Past Medical History:  Diagnosis Date  . BACTERIAL PNEUMONIA 10/09/2009   Qualifier: Diagnosis of  By: Jorene Minors, Scott    . Diabetes mellitus without complication ([place])   . Hypertension     History reviewed. No pertinent surgical history.  No Known Allergies   Outpatient Medications Prior to Visit  Medication Sig Dispense Refill  . aspirin 81 MG tablet Take 1 tablet (81 mg total) by mouth daily. 30 tablet 5  . Blood Glucose Monitoring Suppl (TRUE METRIX METER) DEVI 1 each by Does not apply route 3 (three) times daily. 1 Device 0  . diphenoxylate-atropine (LOMOTIL) 2.5-0.025 MG tablet 1-2 tabs up to 4 times daily for diarrhea 30 tablet 0  . ferrous sulfate 325 (65 FE) MG tablet Take 1 tablet (325 mg total) by mouth daily with breakfast. 100 tablet 1  . glucose blood (TRUE  METRIX BLOOD GLUCOSE TEST) test strip 1 each by Other route 3 (three) times daily. 100 each 12  . glucose blood test strip Use as instructed 100 each 12  . Insulin Syringe-Needle U-100 (BD INSULIN SYRINGE ULTRAFINE) 31G X 15/64" 0.5 ML MISC Use as directed 100 each 2  . TRUEPLUS LANCETS 28G MISC USE AS DIRECTED 100 each 11  . atorvastatin (LIPITOR) 80 MG tablet Take 1 tablet (80 mg total) by mouth daily. 30 tablet 3  . clarithromycin (BIAXIN) 500 MG tablet Take 1 tablet (500 mg total) by mouth 2 (two) times daily. 20 tablet 0  . cloNIDine (CATAPRES) 0.3 MG tablet Take 1 tablet (0.3 mg total) by mouth 3 (three) times daily. 90 tablet 6  . furosemide (LASIX) 20 MG tablet Take 1 tablet (20 mg total) by mouth daily. 30 tablet 11  . gabapentin (NEURONTIN) 300 MG capsule Take 2 capsules (600 mg total) by mouth 3 (three) times daily. 180 capsule 11  . insulin NPH-regular Human (NOVOLIN 70/30) (70-30) 100 UNIT/ML injection Inject 20 Units into the skin 2 (two) times daily with a meal. 10 mL 6  . isosorbide mononitrate (IMDUR) 30 MG 24 hr tablet Take 1 tablet (30 mg total) by mouth daily. 30 tablet 6  . lisinopril (PRINIVIL,ZESTRIL) 40 MG tablet Take 1 tablet (40 mg total) by mouth daily. 30 tablet 11  . metFORMIN (GLUCOPHAGE) 1000 MG tablet TAKE 1 TABLET BY MOUTH TWICE DAILY WITH MEALS 60 tablet  3   No facility-administered medications prior to visit.     ROS Review of Systems  Constitutional: Negative for activity change, appetite change and fatigue.  HENT: Negative for congestion, sinus pressure and sore throat.   Eyes: Negative for visual disturbance.  Respiratory: Negative for cough, chest tightness, shortness of breath and wheezing.   Cardiovascular: Negative for chest pain and palpitations.  Gastrointestinal: Negative for abdominal distention, abdominal pain and constipation.  Endocrine: Negative for polydipsia.  Genitourinary: Negative for dysuria and frequency.  Musculoskeletal: Negative  for arthralgias and back pain.  Skin: Negative for rash.  Neurological: Negative for tremors, light-headedness and numbness.  Hematological: Does not bruise/bleed easily.  Psychiatric/Behavioral: Negative for agitation and behavioral problems.    Objective:  BP 138/70   Pulse (!) 58   Temp 98.4 F (36.9 C) (Oral)   Ht '5\' 1"'$  (1.549 m)   Wt 165 lb 9.6 oz (75.1 kg)   SpO2 97%   BMI 31.29 kg/m   BP/Weight 08/10/2017 07/23/2017 2/68/3419  Systolic BP 622 297 989  Diastolic BP 70 57 66  Wt. (Lbs) 165.6 - 163  BMI 31.29 - 30.8      Physical Exam  Constitutional: She is oriented to person, place, and time. She appears well-developed and well-nourished.  Cardiovascular: Normal rate, normal heart sounds and intact distal pulses.  No murmur heard. Pulmonary/Chest: Effort normal and breath sounds normal. She has no wheezes. She has no rales. She exhibits no tenderness.  Abdominal: Soft. Bowel sounds are normal. She exhibits no distension and no mass. There is no tenderness.  Musculoskeletal: Normal range of motion.  Neurological: She is alert and oriented to person, place, and time.  Skin: Skin is warm and dry.  Psychiatric: She has a normal mood and affect.     Lab Results  Component Value Date   HGBA1C 8.7 08/10/2017    Assessment & Plan:   1. Uncontrolled type 2 diabetes mellitus with hyperglycemia (HCC) Uncontrolled with A1c of 8.7 She has been taking 15 rather than 20 of NPH twice daily and has been advised to take 20 units twice daily as prescribed Diabetic diet, lifestyle modifications Resume atorvastatin and if joint symptoms return advised to discontinue statin - POCT glucose (manual entry) - POCT glycosylated hemoglobin (Hb A1C) - atorvastatin (LIPITOR) 80 MG tablet; Take 1 tablet (80 mg total) by mouth daily.  Dispense: 30 tablet; Refill: 6 - insulin NPH-regular Human (NOVOLIN 70/30) (70-30) 100 UNIT/ML injection; Inject 20 Units into the skin 2 (two) times daily  with a meal.  Dispense: 30 mL; Refill: 6 - CMP14+EGFR; Future - Lipid panel; Future - Microalbumin/Creatinine Ratio, Urine; Future  2. Essential hypertension, benign Controlled Low sodium, DASH diet - cloNIDine (CATAPRES) 0.3 MG tablet; Take 1 tablet (0.3 mg total) by mouth 3 (three) times daily.  Dispense: 90 tablet; Refill: 6 - isosorbide mononitrate (IMDUR) 30 MG 24 hr tablet; Take 1 tablet (30 mg total) by mouth daily.  Dispense: 30 tablet; Refill: 6 - lisinopril (PRINIVIL,ZESTRIL) 40 MG tablet; Take 1 tablet (40 mg total) by mouth daily.  Dispense: 30 tablet; Refill: 6  3. Type 2 diabetes mellitus with other neurologic complication, with long-term current use of insulin (HCC) Stable - gabapentin (NEURONTIN) 300 MG capsule; Take 2 capsules (600 mg total) by mouth 3 (three) times daily.  Dispense: 180 capsule; Refill: 6 - metFORMIN (GLUCOPHAGE) 1000 MG tablet; Take 1 tablet (1,000 mg total) by mouth 2 (two) times daily with a meal.  Dispense: 60  tablet; Refill: 6  4. Diastolic dysfunction Euvolemic EF 55 to 60% from echo of 11/2014 - furosemide (LASIX) 20 MG tablet; Take 1 tablet (20 mg total) by mouth daily.  Dispense: 30 tablet; Refill: 6   Meds ordered this encounter  Medications  . atorvastatin (LIPITOR) 80 MG tablet    Sig: Take 1 tablet (80 mg total) by mouth daily.    Dispense:  30 tablet    Refill:  6  . cloNIDine (CATAPRES) 0.3 MG tablet    Sig: Take 1 tablet (0.3 mg total) by mouth 3 (three) times daily.    Dispense:  90 tablet    Refill:  6  . insulin NPH-regular Human (NOVOLIN 70/30) (70-30) 100 UNIT/ML injection    Sig: Inject 20 Units into the skin 2 (two) times daily with a meal.    Dispense:  30 mL    Refill:  6  . gabapentin (NEURONTIN) 300 MG capsule    Sig: Take 2 capsules (600 mg total) by mouth 3 (three) times daily.    Dispense:  180 capsule    Refill:  6  . isosorbide mononitrate (IMDUR) 30 MG 24 hr tablet    Sig: Take 1 tablet (30 mg total) by mouth  daily.    Dispense:  30 tablet    Refill:  6  . lisinopril (PRINIVIL,ZESTRIL) 40 MG tablet    Sig: Take 1 tablet (40 mg total) by mouth daily.    Dispense:  30 tablet    Refill:  6  . metFORMIN (GLUCOPHAGE) 1000 MG tablet    Sig: Take 1 tablet (1,000 mg total) by mouth 2 (two) times daily with a meal.    Dispense:  60 tablet    Refill:  6  . furosemide (LASIX) 20 MG tablet    Sig: Take 1 tablet (20 mg total) by mouth daily.    Dispense:  30 tablet    Refill:  6    Follow-up: Return in about 3 months (around 11/10/2017) for Follow-up of chronic medical conditions.   Charlott Rakes MD

## 2017-08-10 NOTE — Patient Instructions (Signed)
Diabetes mellitus y nutrición  Diabetes Mellitus and Nutrition  Si sufre de diabetes (diabetes mellitus), es muy importante tener hábitos alimenticios saludables debido a que sus niveles de azúcar en la sangre (glucosa) se ven afectados en gran medida por lo que come y bebe. Comer alimentos saludables en las cantidades adecuadas, aproximadamente a la misma hora todos los días, lo ayudará a:  · Controlar la glucemia.  · Disminuir el riesgo de sufrir una enfermedad cardíaca.  · Mejorar la presión arterial.  · Alcanzar o mantener un peso saludable.    Todas las personas que sufren de diabetes son diferentes y cada una tiene necesidades diferentes en cuanto a un plan de alimentación. El médico puede recomendarle que trabaje con un especialista en dietas y nutrición (nutricionista) para elaborar el mejor plan para usted. Su plan de alimentación puede variar según factores como:  · Las calorías que necesita.  · Los medicamentos que toma.  · Su peso.  · Sus niveles de glucemia, presión arterial y colesterol.  · Su nivel de actividad.  · Otras afecciones que tenga, como enfermedades cardíacas o renales.    ¿Cómo me afectan los carbohidratos?  Los carbohidratos afectan el nivel de glucemia más que cualquier otro tipo de alimento. La ingesta de carbohidratos naturalmente aumenta la cantidad glucosa en la sangre. El recuento de carbohidratos es un método destinado a llevar un registro de la cantidad de carbohidratos que se ingieren. El recuento de carbohidratos es importante para mantener la glucemia a un nivel saludable, en especial si utiliza insulina o toma determinados medicamentos por vía oral para la diabetes.  Es importante saber la cantidad de carbohidratos que se pueden ingerir en cada comida sin correr ningún riesgo. Esto es diferente en cada persona. El nutricionista puede ayudarlo a calcular la cantidad de carbohidratos que debe ingerir en cada comida y colación.   Los alimentos que contienen carbohidratos incluyen:  · Pan, cereal, arroz, pasta y galletas.  · Papas y maíz.  · Guisantes, frijoles y lentejas.  · Leche y yogur.  · Frutas y jugo.  · Postres, como pasteles, galletitas, helado y caramelos.    ¿Cómo me afecta el alcohol?  El alcohol puede provocar disminuciones súbitas de la glucemia (hipoglucemia), en especial si utiliza insulina o toma determinados medicamentos por vía oral para la diabetes. La hipoglucemia es una afección potencialmente mortal. Los síntomas de la hipoglucemia (somnolencia, mareos y confusión) son similares a los síntomas de haber consumido demasiado alcohol.  Si el médico afirma que el alcohol es seguro para usted, siga estas pautas:  · Limite el consumo de alcohol a no más de 1 medida por día si es mujer y no está embarazada, y a 2 medidas si es hombre. Una medida equivale a 12 oz (355 ml) de cerveza, 5 oz (148 ml) de vino o 1½ oz (44 ml) de bebidas de alta graduación alcohólica.  · No beba con el estómago vacío.  · Manténgase hidratado con agua, gaseosas dietéticas o té helado sin azúcar.  · Tenga en cuenta que las gaseosas comunes, los jugos y otros refrescos pueden contener mucha azúcar y se deben contar como carbohidratos.    Consejos para seguir este plan  Leer las etiquetas de los alimentos  · Comience por controlar el tamaño de la porción en la etiqueta. La cantidad de calorías, carbohidratos, grasas y otros nutrientes mencionados en la etiqueta se basan en una porción del alimento. Muchos alimentos contienen más de una porción por envase.  · Verifique la cantidad total de gramos (g)   de carbohidratos totales en una porción. Puede calcular la cantidad de porciones de carbohidratos al dividir el total de carbohidratos por 15. Por ejemplo, si un alimento posee un total de 30 g de carbohidratos, equivale a 2 porciones de carbohidratos.  · Verifique la cantidad de gramos (g) de grasas saturadas y grasas trans  en una porción. Escoja alimentos que no contengan grasa o que tengan un bajo contenido.  · Controle la cantidad de miligramos (mg) de sodio en una porción. La mayoría de las personas deben limitar la ingesta de sodio total a menos de 2300 mg por día.  · Siempre consulte la información nutricional de los alimentos etiquetados como “con bajo contenido de grasa” o “sin grasa”. Estos alimentos pueden ser más altos en azúcar agregada o en carbohidratos refinados y deben evitarse.  · Hable con el nutricionista para identificar sus objetivos diarios en cuanto a los nutrientes mencionados en la etiqueta.  De compras  · Evite comprar alimentos procesados, enlatados o prehechos. Estos alimentos tienden a tener mayor cantidad de grasa, sodio y azúcar agregada.  · Compre en la zona exterior de la tienda de comestibles. Esta incluye frutas y vegetales frescos, granos a granel, carnes frescas y productos lácteos frescos.  Cocción  · Utilice métodos de cocción a baja temperatura, como hornear, en lugar de métodos de cocción a alta temperatura, como freír en abundante aceite.  · Cocine con aceites saludables, como el aceite de oliva, canola o girasol.  · Evite cocinar con manteca, crema o carnes con alto contenido de grasa.  Planificación de las comidas  · Consuma las comidas y las colaciones de forma regular, preferentemente a la misma hora todos los días. Evite pasar largos períodos de tiempo sin comer.  · Consuma alimentos ricos en fibra, como frutas frescas, verduras, frijoles y cereales integrales. Consulte al nutricionista sobre cuántas porciones de carbohidratos puede consumir en cada comida.  · Consuma entre 4 y 6 onzas de proteínas magras por día, como carnes magras, pollo, pescado, huevos o tofu. 1 onza equivale a 1 onza de carne, pollo o pescado, 1 huevo, o 1/4 taza de tofu.  · Coma algunos alimentos por día que contengan grasas saludables, como aguacates, frutos secos, semillas y pescado.  Estilo de vida     · Controle su nivel de glucemia con regularidad.  · Haga ejercicio al menos 30 minutos, 5 días o más por semana, o como se lo haya indicado el médico.  · Tome los medicamentos como se lo haya indicado el médico.  · No consuma ningún producto que contenga nicotina o tabaco, como cigarrillos y cigarrillos electrónicos. Si necesita ayuda para dejar de fumar, consulte al médico.  · Trabaje con un asesor o instructor en diabetes para identificar estrategias para controlar el estrés y cualquier desafío emocional y social.  ¿Cuáles son algunas de las preguntas que puedo hacerle a mi médico?  · ¿Es necesario que me reúna con un instructor en diabetes?  · ¿Es necesario que me reúna con un nutricionista?  · ¿A qué número puedo llamar si tengo preguntas?  · ¿Cuáles son los mejores momentos para controlar la glucemia?  Dónde encontrar más información:  · Asociación Americana de la Diabetes (American Diabetes Association): diabetes.org/food-and-fitness/food  · Academia de Nutrición y Dietética (Academy of Nutrition and Dietetics): www.eatright.org/resources/health/diseases-and-conditions/diabetes  · Instituto Nacional de la Diabetes y las Enfermedades Digestivas y Renales (National Institute of Diabetes and Digestive and Kidney Diseases) (Institutos Nacionales de Salud, NIH): www.niddk.nih.gov/health-information/diabetes/overview/diet-eating-physical-activity  Resumen  · Un plan de alimentación saludable   lo ayudará a controlar la glucemia y mantener un estilo de vida saludable.  · Trabajar con un especialista en dietas y nutrición (nutricionista) puede ayudarlo a elaborar el mejor plan de alimentación para usted.  · Tenga en cuenta que los carbohidratos y el alcohol tienen efectos inmediatos en sus niveles de glucemia. Es importante contar los carbohidratos y consumir alcohol con prudencia.  Esta información no tiene como fin reemplazar el consejo del médico. Asegúrese de hacerle al médico cualquier pregunta que tenga.   Document Released: 06/29/2007 Document Revised: 07/12/2016 Document Reviewed: 07/12/2016  Elsevier Interactive Patient Education © 2018 Elsevier Inc.

## 2017-08-15 ENCOUNTER — Other Ambulatory Visit: Payer: Self-pay

## 2017-08-15 ENCOUNTER — Ambulatory Visit: Payer: Self-pay | Attending: Family Medicine

## 2017-08-15 DIAGNOSIS — E1165 Type 2 diabetes mellitus with hyperglycemia: Secondary | ICD-10-CM | POA: Insufficient documentation

## 2017-08-15 NOTE — Progress Notes (Signed)
Patient here for lab visit only 

## 2017-08-16 LAB — MICROALBUMIN / CREATININE URINE RATIO
Creatinine, Urine: 11.6 mg/dL
MICROALB/CREAT RATIO: 812.9 mg/g{creat} — AB (ref 0.0–30.0)
MICROALBUM., U, RANDOM: 94.3 ug/mL

## 2017-08-16 LAB — CMP14+EGFR
ALK PHOS: 96 IU/L (ref 39–117)
ALT: 12 IU/L (ref 0–32)
AST: 21 IU/L (ref 0–40)
Albumin/Globulin Ratio: 1.5 (ref 1.2–2.2)
Albumin: 4.1 g/dL (ref 3.6–4.8)
BILIRUBIN TOTAL: 0.5 mg/dL (ref 0.0–1.2)
BUN / CREAT RATIO: 20 (ref 12–28)
BUN: 14 mg/dL (ref 8–27)
CHLORIDE: 101 mmol/L (ref 96–106)
CO2: 26 mmol/L (ref 20–29)
CREATININE: 0.71 mg/dL (ref 0.57–1.00)
Calcium: 9.1 mg/dL (ref 8.7–10.3)
GFR calc Af Amer: 102 mL/min/{1.73_m2} (ref >59)
GFR calc non Af Amer: 88 mL/min/{1.73_m2} (ref >59)
GLOBULIN, TOTAL: 2.7 g/dL (ref 1.5–4.5)
GLUCOSE: 81 mg/dL (ref 65–99)
Potassium: 4.6 mmol/L (ref 3.5–5.2)
SODIUM: 141 mmol/L (ref 134–144)
Total Protein: 6.8 g/dL (ref 6.0–8.5)

## 2017-08-16 LAB — LIPID PANEL
CHOLESTEROL TOTAL: 165 mg/dL (ref 100–199)
Chol/HDL Ratio: 3.8 ratio (ref 0.0–4.4)
HDL: 43 mg/dL (ref >39)
LDL CALC: 105 mg/dL — AB (ref 0–99)
TRIGLYCERIDES: 85 mg/dL (ref 0–149)
VLDL Cholesterol Cal: 17 mg/dL (ref 5–40)

## 2017-08-17 ENCOUNTER — Telehealth: Payer: Self-pay

## 2017-08-17 NOTE — Telephone Encounter (Signed)
CMA call regarding lab results   Patient did not answer CMA left Vm regarding lab results & to call back   If patient calls back just let her know that Liver function,cholesterol are normal however her urine has protein in it which is an indication of diabetes affecting her kidneys.  Optimal glycemic control is important. Advised to continue lisinopril which is beneficial in this regard.

## 2017-08-17 NOTE — Telephone Encounter (Signed)
Patient returned nurse call and was informed of lab results. Patient had no questions.  °

## 2017-08-17 NOTE — Telephone Encounter (Signed)
-----   Message from Ronette Deter, [place] sent at 08/17/2017 10:15 AM EDT -----   ----- Message ----- From: Hoy Register, MD Sent: 08/16/2017   1:52 PM To: Ronette Deter, CMA  Liver function,cholesterol are normal however her urine has protein in it which is an indication of diabetes affecting her kidneys.  Optimal glycemic control is important. Advised to continue lisinopril which is beneficial in this regard.

## 2017-08-24 MED FILL — ?HUMULIN 70/30 VIAL: (70-30) 100 | 25 days supply | Qty: 10 | Fill #0

## 2017-09-06 MED FILL — ?METFORMIN HCL 1,000 MG TAB: 1000 | 30 days supply | Qty: 60 | Fill #2

## 2017-09-06 MED FILL — GABAPENTIN 300 MG CAPSULE: 300 | 30 days supply | Qty: 180 | Fill #0

## 2017-09-06 MED FILL — FUROSEMIDE 20 MG TABLET: 20 | 30 days supply | Qty: 30 | Fill #6

## 2017-09-06 MED FILL — LISINOPRIL 40 MG TABLET: 40 | 30 days supply | Qty: 30 | Fill #6

## 2017-09-06 MED FILL — ISOSORBIDE MN ER 30 MG TAB: 30 | 30 days supply | Qty: 30 | Fill #3

## 2017-09-06 MED FILL — ?CLONDINE HCL 0.3 MG TABLET: 0.3 | 30 days supply | Qty: 90 | Fill #3

## 2017-09-20 MED FILL — ?HUMULIN 70/30 VIAL: (70-30) 100 | 25 days supply | Qty: 10 | Fill #1

## 2017-10-07 MED FILL — ISOSORBIDE MN ER 30 MG TAB: 30 | 30 days supply | Qty: 30 | Fill #4

## 2017-10-07 MED FILL — LISINOPRIL 40 MG TABLET: 40 | 30 days supply | Qty: 30 | Fill #7

## 2017-10-07 MED FILL — ?CLONIDINE HCL 0.3 MG TAB: 0.3 | 30 days supply | Qty: 90 | Fill #4

## 2017-10-07 MED FILL — FUROSEMIDE 20 MG TABLET: 20 | 30 days supply | Qty: 30 | Fill #7

## 2017-10-07 MED FILL — ?METFORMIN HCL 1,000 MG TAB: 1000 | 30 days supply | Qty: 60 | Fill #3

## 2017-10-19 MED FILL — ?HUMULIN 70/30 VIAL: (70-30) 100 | 25 days supply | Qty: 10 | Fill #2

## 2017-10-27 ENCOUNTER — Ambulatory Visit: Payer: Self-pay | Attending: Family Medicine | Admitting: Physician Assistant

## 2017-10-27 VITALS — BP 183/66 | HR 80 | Temp 98.2°F | Resp 18 | Ht 61.0 in | Wt 168.0 lb

## 2017-10-27 DIAGNOSIS — Z794 Long term (current) use of insulin: Secondary | ICD-10-CM | POA: Insufficient documentation

## 2017-10-27 DIAGNOSIS — Z79899 Other long term (current) drug therapy: Secondary | ICD-10-CM | POA: Insufficient documentation

## 2017-10-27 DIAGNOSIS — I10 Essential (primary) hypertension: Secondary | ICD-10-CM | POA: Insufficient documentation

## 2017-10-27 DIAGNOSIS — M79605 Pain in left leg: Secondary | ICD-10-CM | POA: Insufficient documentation

## 2017-10-27 DIAGNOSIS — M79604 Pain in right leg: Secondary | ICD-10-CM | POA: Insufficient documentation

## 2017-10-27 DIAGNOSIS — Z7982 Long term (current) use of aspirin: Secondary | ICD-10-CM | POA: Insufficient documentation

## 2017-10-27 DIAGNOSIS — Z789 Other specified health status: Secondary | ICD-10-CM

## 2017-10-27 DIAGNOSIS — E1165 Type 2 diabetes mellitus with hyperglycemia: Secondary | ICD-10-CM | POA: Insufficient documentation

## 2017-10-27 DIAGNOSIS — R609 Edema, unspecified: Secondary | ICD-10-CM | POA: Insufficient documentation

## 2017-10-27 LAB — GLUCOSE, POCT (MANUAL RESULT ENTRY): POC Glucose: 66 mg/dl — AB (ref 70–99)

## 2017-10-27 MED ORDER — MELOXICAM 7.5 MG PO TABS: 7.5000 mg | ORAL_TABLET | Freq: Every day | ORAL | 0 refills | Status: DC

## 2017-10-27 MED FILL — MELOXICAM 7.5 MG TABLET: 7.5 | 30 days supply | Qty: 30 | Fill #0

## 2017-10-27 NOTE — Patient Instructions (Addendum)
Take lasix twice daily for 3 days   Edema (Edema) Un edema es una acumulacin anormal de lquidos. Es ms frecuente en las piernas y los muslos. La hinchazn indolora de pies y tobillos es ms probable a medida que una persona envejece. Tambin es comn en la piel ms floja, como alrededor [employer]. CUIDADOS EN EL HOGAR  Mantenga la parte afectada del cuerpo por encima del nivel del corazn mientras est recostado.  No se quede quieto ni permanezca de pie durante [employer].  No coloque nada exactamente debajo de las rodillas al recostarse.  No use ropa ajustada en los muslos.  Ejercite las piernas para ayudar a que la inflamacin (hinchazn) disminuya.  Use vendajes elsticos o medias de compresin segn las indicaciones del mdico.  Una dieta con bajo contenido de sal puede ayudar a disminuir la hinchazn.  Solo tome los [employer] le haya indicado su mdico.  SOLICITE AYUDA SI:  El tratamiento no funciona.  Tiene una enfermedad cardaca, heptica o renal, y nota que la piel parece hinchada o tiene aspecto brillante.  Tiene hinchazn en las piernas que no mejora cuando las eleva.  Ha aumentado sbitamente de peso sin ningn motivo.  SOLICITE AYUDA DE INMEDIATO SI:  Tiene dificultad para respirar o le duele el pecho.  No puede respirar cuando se acuesta.  Las reas hinchadas presentan dolor, enrojecimiento o Airline pilot.  Tiene una enfermedad cardaca, heptica o renal, y le aparece un edema de repente.  Tiene fiebre y los sntomas empeoran de manera sbita.  ASEGRESE DE QUE:  Comprende estas instrucciones.  Controlar su afeccin.  Recibir ayuda de inmediato si no mejora o si empeora.  Esta informacin no tiene Theme park manager el consejo del mdico. Asegrese de hacerle al mdico cualquier pregunta que tenga. Document Released: 01/10/2013 Document Revised: 03/27/2013 Document Reviewed: 01/12/2013 Elsevier Interactive Patient Education  2017  [employer].

## 2017-10-27 NOTE — Progress Notes (Signed)
Patient ID: Carol Finley, female   DOB: 27-Mar-1951, 67 y.o.   MRN: [id number]         Carol Finley, is a 67 y.o. female  WUJ:[id number]  [id number]  DOB - 16-Oct-1950  Subjective:  Chief Complaint and HPI: Carol Finley is a 67 y.o. female here today for leg swelling and pain. Alecia Lemming with [facility] interpreters translating Leg pain and foot pain X 1 week.  Worsened a week ago when standing and walking more than normal.  NKI.  No SOB.  No DOE.  No CP.   She says she is compliant with all her meds but hasn't taken any today including BP meds.    ROS:   Constitutional:  No f/c, No night sweats, No unexplained weight loss. EENT:  No vision changes, No blurry vision, No hearing changes. No mouth, throat, or ear problems.  Respiratory: No cough, No SOB Cardiac: No CP, no palpitations GI:  No abd pain, No N/V/D. GU: No Urinary s/sx Musculoskeletal: +lower leg and feet pain B Neuro: No headache, no dizziness, no motor weakness.  Skin: No rash Endocrine:  No polydipsia. No polyuria.  Psych: Denies SI/HI  No problems updated.  ALLERGIES: No Known Allergies  PAST MEDICAL HISTORY: Past Medical History:  Diagnosis Date  . BACTERIAL PNEUMONIA 10/09/2009   Qualifier: Diagnosis of  By: Huntley Dec, Scott    . Diabetes mellitus without complication (HCC)   . Hypertension     MEDICATIONS AT HOME: Prior to Admission medications   Medication Sig Start Date End Date Taking? Authorizing Provider  aspirin 81 MG tablet Take 1 tablet (81 mg total) by mouth daily. 11/19/14  Yes Casey Burkitt, MD  atorvastatin (LIPITOR) 80 MG tablet Take 1 tablet (80 mg total) by mouth daily. 08/10/17  Yes Hoy Register, MD  Blood Glucose Monitoring Suppl (TRUE METRIX METER) DEVI 1 each by Does not apply route 3 (three) times daily. 07/18/17  Yes Hoy Register, MD  cloNIDine (CATAPRES) 0.3 MG tablet Take 1 tablet (0.3 mg total) by mouth 3 (three) times  daily. 08/10/17  Yes Hoy Register, MD  diphenoxylate-atropine (LOMOTIL) 2.5-0.025 MG tablet 1-2 tabs up to 4 times daily for diarrhea 05/12/17  Yes Hoy Register, MD  ferrous sulfate 325 (65 FE) MG tablet Take 1 tablet (325 mg total) by mouth daily with breakfast. 01/31/17  Yes McClung, Angela M, PA-C  furosemide (LASIX) 20 MG tablet Take 1 tablet (20 mg total) by mouth daily. 08/10/17  Yes Hoy Register, MD  gabapentin (NEURONTIN) 300 MG capsule Take 2 capsules (600 mg total) by mouth 3 (three) times daily. 08/10/17  Yes Newlin, Enobong, MD  glucose blood (TRUE METRIX BLOOD GLUCOSE TEST) test strip 1 each by Other route 3 (three) times daily. 07/18/17  Yes Newlin, Odette Horns, MD  glucose blood test strip Use as instructed 07/18/17  Yes Newlin, Enobong, MD  insulin NPH-regular Human (NOVOLIN 70/30) (70-30) 100 UNIT/ML injection Inject 20 Units into the skin 2 (two) times daily with a meal. 08/10/17  Yes Newlin, Enobong, MD  Insulin Syringe-Needle U-100 (BD INSULIN SYRINGE ULTRAFINE) 31G X 15/64" 0.5 ML MISC Use as directed 05/18/17  Yes Newlin, Enobong, MD  isosorbide mononitrate (IMDUR) 30 MG 24 hr tablet Take 1 tablet (30 mg total) by mouth daily. 08/10/17  Yes Hoy Register, MD  lisinopril (PRINIVIL,ZESTRIL) 40 MG tablet Take 1 tablet (40 mg total) by mouth daily. 08/10/17  Yes Hoy Register, MD  metFORMIN (GLUCOPHAGE) 1000 MG tablet Take 1 tablet (1,000 mg  total) by mouth 2 (two) times daily with a meal. 08/10/17  Yes Hoy Register, MD  TRUEPLUS LANCETS 28G MISC USE AS DIRECTED 07/18/17  Yes Newlin, Enobong, MD  meloxicam (MOBIC) 7.5 MG tablet Take 1 tablet (7.5 mg total) by mouth daily. 10/27/17   Anders Simmonds, PA-C     Objective:  EXAM:   Vitals:   10/27/17 0847  BP: (!) 183/66  Pulse: 80  Resp: 18  Temp: 98.2 F (36.8 C)  TempSrc: Oral  SpO2: 100%  Weight: 168 lb (76.2 kg)  Height: 5\' 1"  (1.549 m)    General appearance : A&OX3. NAD. Non-toxic-appearing HEENT: Atraumatic and  Normocephalic.  PERRLA. EOM intact.  Neck: supple, no JVD. No cervical lymphadenopathy. No thyromegaly Chest/Lungs:  Breathing-non-labored, Good air entry bilaterally, breath sounds normal without rales, rhonchi, or wheezing  CVS: S1 S2 regular, no murmurs, gallops, rubs  Extremities: Bilateral Lower Ext shows 1+ edema, no homan's.  both legs are warm to touch with = pulse throughout Neurology:  CN II-XII grossly intact, Non focal.   Psych:  TP linear. J/I WNL. Normal speech. Appropriate eye contact and affect.  Skin:  No Rash  Data Review Lab Results  Component Value Date   HGBA1C 8.7 08/10/2017   HGBA1C 8.0 04/11/2017   HGBA1C 8.2 01/26/2017     Assessment & Plan   1. Edema, unspecified type Likely dependent edema due to excess standing.  Take lasix bid X 3 days, elevate, decrease salt and sugar.  Drink water - Basic metabolic panel  2. Uncontrolled type 2 diabetes mellitus with hyperglycemia (HCC) Controlled-continue current regimen - Glucose (CBG)  3. Pain in both lower extremities - Vitamin D, 25-hydroxy - meloxicam (MOBIC) 7.5 MG tablet; Take 1 tablet (7.5 mg total) by mouth daily.  Dispense: 30 tablet; Refill: 0  4. Essential hypertension, benign Not controlled but hasn't taken meds.  Take meds as directed  5. Language barrier stratus interpreters used and additional time performing visit was required  Patient have been counseled extensively about nutrition and exercise  Return for keep 8/13 appt.  The patient was given clear instructions to go to ER or return to medical center if symptoms don't improve, worsen or new problems develop. The patient verbalized understanding. The patient was told to call to get lab results if they haven't heard anything in the next week.     Georgian Co, PA-C [facility] and [facility] [place], [place] [phone]   10/27/2017, 8:57 AM

## 2017-10-28 LAB — BASIC METABOLIC PANEL
BUN / CREAT RATIO: 23 (ref 12–28)
BUN: 14 mg/dL (ref 8–27)
CO2: 25 mmol/L (ref 20–29)
Calcium: 9.5 mg/dL (ref 8.7–10.3)
Chloride: 102 mmol/L (ref 96–106)
Creatinine, Ser: 0.61 mg/dL (ref 0.57–1.00)
GFR calc Af Amer: 108 mL/min/{1.73_m2} (ref >59)
GFR calc non Af Amer: 94 mL/min/{1.73_m2} (ref >59)
GLUCOSE: 60 mg/dL — AB (ref 65–99)
Potassium: 4 mmol/L (ref 3.5–5.2)
SODIUM: 143 mmol/L (ref 134–144)

## 2017-10-28 LAB — VITAMIN D 25 HYDROXY (VIT D DEFICIENCY, FRACTURES): VIT D 25 HYDROXY: 36.2 ng/mL (ref 30.0–100.0)

## 2017-11-01 ENCOUNTER — Telehealth: Payer: Self-pay | Admitting: *Deleted

## 2017-11-01 NOTE — Telephone Encounter (Signed)
Medical Assistant used [employer] to contact patient.  Interpreter Name:Christian Interpreter #: [id number] Patient verified DOB Patient is aware of labs being normal including vitamin d being normal to follow up as planned. No further questions.

## 2017-11-01 NOTE — Telephone Encounter (Signed)
-----   Message from Anders Simmonds, [place] sent at 11/01/2017  8:13 AM EDT ----- Labs are good including vitamin D.  Follow up as planned.  Thanks, Georgian Co, PA-C

## 2017-11-02 ENCOUNTER — Ambulatory Visit: Payer: Self-pay | Attending: Family Medicine | Admitting: Family Medicine

## 2017-11-02 ENCOUNTER — Encounter: Payer: Self-pay | Admitting: Family Medicine

## 2017-11-02 VITALS — BP 173/76 | HR 71 | Temp 98.8°F | Ht 61.0 in | Wt 171.6 lb

## 2017-11-02 DIAGNOSIS — E1165 Type 2 diabetes mellitus with hyperglycemia: Secondary | ICD-10-CM | POA: Insufficient documentation

## 2017-11-02 DIAGNOSIS — Z7982 Long term (current) use of aspirin: Secondary | ICD-10-CM | POA: Insufficient documentation

## 2017-11-02 DIAGNOSIS — E1140 Type 2 diabetes mellitus with diabetic neuropathy, unspecified: Secondary | ICD-10-CM | POA: Insufficient documentation

## 2017-11-02 DIAGNOSIS — Z794 Long term (current) use of insulin: Secondary | ICD-10-CM | POA: Insufficient documentation

## 2017-11-02 DIAGNOSIS — I10 Essential (primary) hypertension: Secondary | ICD-10-CM | POA: Insufficient documentation

## 2017-11-02 DIAGNOSIS — J0110 Acute frontal sinusitis, unspecified: Secondary | ICD-10-CM | POA: Insufficient documentation

## 2017-11-02 DIAGNOSIS — E785 Hyperlipidemia, unspecified: Secondary | ICD-10-CM | POA: Insufficient documentation

## 2017-11-02 DIAGNOSIS — Z79899 Other long term (current) drug therapy: Secondary | ICD-10-CM | POA: Insufficient documentation

## 2017-11-02 LAB — GLUCOSE, POCT (MANUAL RESULT ENTRY): POC GLUCOSE: 160 mg/dL — AB (ref 70–99)

## 2017-11-02 MED ORDER — CETIRIZINE HCL 10 MG PO TABS: 10.0000 mg | ORAL_TABLET | Freq: Every day | ORAL | 1 refills | Status: DC

## 2017-11-02 MED ORDER — ALBUTEROL SULFATE HFA 108 (90 BASE) MCG/ACT IN AERS
2.0000 | INHALATION_SPRAY | Freq: Four times a day (QID) | RESPIRATORY_TRACT | 1 refills | Status: DC | PRN
Start: 2017-11-02 — End: 2018-01-12

## 2017-11-02 MED FILL — !VENTOLIN HFA INHALER: 108 (90 BAS | 25 days supply | Qty: 18 | Fill #0

## 2017-11-02 MED FILL — ?CETIRIZINE HCL 10 MG TABLE: 10 | 30 days supply | Qty: 30 | Fill #0

## 2017-11-02 NOTE — Patient Instructions (Signed)

## 2017-11-02 NOTE — Progress Notes (Signed)
Subjective:  Patient ID: Carol Finley, female    DOB: 1950-12-13  Age: 67 y.o. MRN: [id number]  CC: Shortness of Breath   HPI Carol Finley is a 67 year old female with a history of type 2 diabetes mellitus (A1c 8.7), hypertension, hyperlipidemia, diabetic neuropathy, diastolic dysfunction (EF 55 to 60% from echo 11/2014) who presents with a 3 day history of difficulty breathing at night which she describes as having to mouth breathe. Also endorses nasal stuffiness, headaches but denies fever, wheezing or chest pain. Feels like coughing but is unable to cough. She mentioned having L breast pain 3 days ago which lasted a few seconds but is absent and denies the presence of lumps. Last mammogram from 07/2017 was negative for malignancy.   Past Medical History:  Diagnosis Date  . BACTERIAL PNEUMONIA 10/09/2009   Qualifier: Diagnosis of  By: Huntley Dec, Scott    . Diabetes mellitus without complication (HCC)   . Hypertension     No past surgical history on file.  No Known Allergies    Outpatient Medications Prior to Visit  Medication Sig Dispense Refill  . aspirin 81 MG tablet Take 1 tablet (81 mg total) by mouth daily. 30 tablet 5  . atorvastatin (LIPITOR) 80 MG tablet Take 1 tablet (80 mg total) by mouth daily. 30 tablet 6  . Blood Glucose Monitoring Suppl (TRUE METRIX METER) DEVI 1 each by Does not apply route 3 (three) times daily. 1 Device 0  . cloNIDine (CATAPRES) 0.3 MG tablet Take 1 tablet (0.3 mg total) by mouth 3 (three) times daily. 90 tablet 6  . diphenoxylate-atropine (LOMOTIL) 2.5-0.025 MG tablet 1-2 tabs up to 4 times daily for diarrhea 30 tablet 0  . ferrous sulfate 325 (65 FE) MG tablet Take 1 tablet (325 mg total) by mouth daily with breakfast. 100 tablet 1  . furosemide (LASIX) 20 MG tablet Take 1 tablet (20 mg total) by mouth daily. 30 tablet 6  . gabapentin (NEURONTIN) 300 MG capsule Take 2 capsules (600 mg total) by mouth 3 (three)  times daily. 180 capsule 6  . glucose blood (TRUE METRIX BLOOD GLUCOSE TEST) test strip 1 each by Other route 3 (three) times daily. 100 each 12  . glucose blood test strip Use as instructed 100 each 12  . insulin NPH-regular Human (NOVOLIN 70/30) (70-30) 100 UNIT/ML injection Inject 20 Units into the skin 2 (two) times daily with a meal. 30 mL 6  . Insulin Syringe-Needle U-100 (BD INSULIN SYRINGE ULTRAFINE) 31G X 15/64" 0.5 ML MISC Use as directed 100 each 2  . isosorbide mononitrate (IMDUR) 30 MG 24 hr tablet Take 1 tablet (30 mg total) by mouth daily. 30 tablet 6  . lisinopril (PRINIVIL,ZESTRIL) 40 MG tablet Take 1 tablet (40 mg total) by mouth daily. 30 tablet 6  . meloxicam (MOBIC) 7.5 MG tablet Take 1 tablet (7.5 mg total) by mouth daily. 30 tablet 0  . metFORMIN (GLUCOPHAGE) 1000 MG tablet Take 1 tablet (1,000 mg total) by mouth 2 (two) times daily with a meal. 60 tablet 6  . TRUEPLUS LANCETS 28G MISC USE AS DIRECTED 100 each 11   No facility-administered medications prior to visit.     ROS Review of Systems  Constitutional: Negative for activity change, appetite change and fatigue.  HENT: Negative for congestion, sinus pressure and sore throat.   Eyes: Negative for visual disturbance.  Respiratory: Positive for shortness of breath. Negative for cough, chest tightness and wheezing.   Cardiovascular: Negative for chest  pain and palpitations.  Gastrointestinal: Negative for abdominal distention, abdominal pain and constipation.  Endocrine: Negative for polydipsia.  Genitourinary: Negative for dysuria and frequency.  Musculoskeletal: Negative for arthralgias and back pain.  Skin: Negative for rash.  Neurological: Negative for tremors, light-headedness and numbness.  Hematological: Does not bruise/bleed easily.  Psychiatric/Behavioral: Negative for agitation and behavioral problems.    Objective:  BP (!) 173/76   Pulse 71   Temp 98.8 F (37.1 C) (Oral)   Ht 5\' 1"  (1.549 m)    Wt 171 lb 9.6 oz (77.8 kg)   SpO2 97%   BMI 32.42 kg/m   BP/Weight 11/02/2017 10/27/2017 08/10/2017  Systolic BP 173 183 138  Diastolic BP 76 66 70  Wt. (Lbs) 171.6 168 165.6  BMI 32.42 31.74 31.29     Physical Exam  Constitutional: She is oriented to person, place, and time. She appears well-developed and well-nourished.  HENT:  Right Ear: External ear normal.  Left Ear: External ear normal.  Mouth/Throat: Oropharynx is clear and moist. No posterior oropharyngeal edema.  Cardiovascular: Normal rate, normal heart sounds and intact distal pulses.  No murmur heard. Pulmonary/Chest: Effort normal and breath sounds normal. She has no wheezes. She has no rales. She exhibits no tenderness.  Abdominal: Soft. Bowel sounds are normal. She exhibits no distension and no mass. There is no tenderness.  Musculoskeletal: Normal range of motion.  Lymphadenopathy:    She has no cervical adenopathy.  Neurological: She is alert and oriented to person, place, and time.     Assessment & Plan:   1. Uncontrolled type 2 diabetes mellitus with hyperglycemia (HCC) Uncontrolled with A1c of 8.7 Regimen had been adjusted at her lat office visit Diabetic diet and lifestyle modifications - POCT glucose (manual entry)  2. Acute non-recurrent frontal sinusitis Placed on Zyrtec and MDI No indication for antibiotic at this time  3. Essential hypertension, benign Uncontrolled BP was normal at last OV No regimen change today, Low sodium, DASH diet   Meds ordered this encounter  Medications  . albuterol (PROVENTIL HFA;VENTOLIN HFA) 108 (90 Base) MCG/ACT inhaler    Sig: Inhale 2 puffs into the lungs every 6 (six) hours as needed for wheezing or shortness of breath.    Dispense:  1 Inhaler    Refill:  1  . cetirizine (ZYRTEC) 10 MG tablet    Sig: Take 1 tablet (10 mg total) by mouth daily.    Dispense:  30 tablet    Refill:  1    Follow-up: Return for follow up of medical conditions, keep previously  scheduled appointment.   Hoy Register MD

## 2017-11-03 ENCOUNTER — Encounter: Payer: Self-pay | Admitting: Family Medicine

## 2017-11-04 MED FILL — LISINOPRIL 40 MG TABLET: 40 | 30 days supply | Qty: 30 | Fill #8

## 2017-11-04 MED FILL — GABAPENTIN 300 MG CAPSULE: 300 | 30 days supply | Qty: 180 | Fill #1

## 2017-11-04 MED FILL — ?METFORMIN HCL 1000MG TABS: 1000 | 30 days supply | Qty: 60 | Fill #0

## 2017-11-04 MED FILL — ?FUROSEMIDE 20 MG TABLET: 20 | 30 days supply | Qty: 30 | Fill #8

## 2017-11-07 MED FILL — ISOSORBIDE MN ER 30 MG TAB: 30 | 30 days supply | Qty: 30 | Fill #5

## 2017-11-15 ENCOUNTER — Other Ambulatory Visit: Payer: Self-pay

## 2017-11-15 ENCOUNTER — Encounter: Payer: Self-pay | Admitting: Family Medicine

## 2017-11-15 ENCOUNTER — Ambulatory Visit: Payer: Self-pay | Attending: Family Medicine | Admitting: Family Medicine

## 2017-11-15 VITALS — BP 119/69 | HR 66 | Temp 98.3°F | Ht 61.0 in | Wt 158.0 lb

## 2017-11-15 DIAGNOSIS — E1149 Type 2 diabetes mellitus with other diabetic neurological complication: Secondary | ICD-10-CM

## 2017-11-15 DIAGNOSIS — Z7982 Long term (current) use of aspirin: Secondary | ICD-10-CM | POA: Insufficient documentation

## 2017-11-15 DIAGNOSIS — I10 Essential (primary) hypertension: Secondary | ICD-10-CM | POA: Insufficient documentation

## 2017-11-15 DIAGNOSIS — R0789 Other chest pain: Secondary | ICD-10-CM | POA: Insufficient documentation

## 2017-11-15 DIAGNOSIS — E119 Type 2 diabetes mellitus without complications: Secondary | ICD-10-CM | POA: Insufficient documentation

## 2017-11-15 DIAGNOSIS — M1990 Unspecified osteoarthritis, unspecified site: Secondary | ICD-10-CM | POA: Insufficient documentation

## 2017-11-15 DIAGNOSIS — M159 Polyosteoarthritis, unspecified: Secondary | ICD-10-CM

## 2017-11-15 DIAGNOSIS — Z79899 Other long term (current) drug therapy: Secondary | ICD-10-CM | POA: Insufficient documentation

## 2017-11-15 DIAGNOSIS — E2839 Other primary ovarian failure: Secondary | ICD-10-CM | POA: Insufficient documentation

## 2017-11-15 DIAGNOSIS — E1165 Type 2 diabetes mellitus with hyperglycemia: Secondary | ICD-10-CM

## 2017-11-15 DIAGNOSIS — Z794 Long term (current) use of insulin: Secondary | ICD-10-CM | POA: Insufficient documentation

## 2017-11-15 LAB — POCT GLYCOSYLATED HEMOGLOBIN (HGB A1C): HbA1c, POC (controlled diabetic range): 7.3 % — AB (ref 0.0–7.0)

## 2017-11-15 LAB — GLUCOSE, POCT (MANUAL RESULT ENTRY): POC Glucose: 94 mg/dl (ref 70–99)

## 2017-11-15 MED ORDER — MELOXICAM 7.5 MG PO TABS: 7.5000 mg | ORAL_TABLET | Freq: Every day | ORAL | 1 refills | Status: DC

## 2017-11-15 NOTE — Patient Instructions (Signed)
Costocondritis  Costochondritis  La costocondritis es la hinchazón e irritación (inflamación) del tejido (cartílago) que une las costillas con el esternón. Esto causa dolor en la parte frontal del pecho. Por lo general, el dolor empieza de manera gradual y compromete más de una costilla.  ¿Cuáles son las causas?  No siempre se conoce la causa exacta de esta afección. Se debe a la sobrecarga en el cartílago donde las costillas se unen con el esternón. La causa de esta sobrecarga puede ser la siguiente:  · Lesión en el tórax (traumatismo).  · Ejercicios o actividades relacionadas con levantar pesos.  · Tos intensa.    ¿Qué incrementa el riesgo?  Puede tener un riesgo más alto de sufrir esta afección si:  · Es mujer.  · Tiene entre 30 y 40 años.  · Comenzó un nuevo ejercicio o una nueva actividad recientemente.  · Tiene niveles bajos de vitamina D.  · Tiene una afección que lo hace toser con frecuencia.    ¿Cuáles son los signos o los síntomas?  El síntoma principal de esta afección es el dolor en el pecho. El dolor:  · Por lo general, comienza de manera gradual y puede ser intenso o sordo.  · Empeora al respirar, toser o hacer ejercicio.  · Mejora con el reposo.  · Puede empeorar al presionar en el lugar donde se unen el esternón con las costillas (dolor a la palpación).    ¿Cómo se diagnostica?  Esta afección se diagnostica en función de los síntomas, los antecedentes médicos y un examen físico. El médico determinará si tiene dolor a la palpación al presionarle el esternón. Este es el signo más importante. Es posible que también le hagan análisis para descartar otras posibles causas. Estos pueden incluir lo siguiente:  · Una radiografía de tórax para verificar si tiene problemas pulmonares.  · Un electrocardiograma (ECG) para verificar si tiene algún problema cardíaco que podría causarle dolor.  · Un estudio de diagnóstico por imágenes para descartar una fractura de tórax o de costilla.    ¿Cómo se trata?   Generalmente, esta afección desaparece con el paso tiempo, sin tratamiento. El médico puede recetarle antiinflamatorios no esteroides (AINE) para aliviar el dolor y la inflamación. El médico también puede recomendarle lo siguiente:  · Hacer reposo y evitar las actividades que empeoren el dolor.  · Aplicar calor o frío en la zona para aliviar el dolor y la inflamación.  · Ejercicios para elongar los músculos del tórax.    Si estos tratamientos no ayudan, es posible que el médico le inyecte un anestésico en el lugar donde se unen el esternón con las costillas para aliviar el dolor.  Siga estas indicaciones en su casa:  · Evite las actividades que empeoren el dolor. Esto incluye cualquier actividad que involucre los músculos del tórax, del abdomen y los laterales.  · Si se lo indican, aplique hielo sobre la zona del dolor:  ? Ponga el hielo en una bolsa plástica.  ? Coloque una toalla entre la piel y la bolsa de hielo.  ? Coloque el hielo durante 20 minutos, 2 o 3 veces por día.  · Si se lo indican, aplique calor en la zona afectada tan frecuentemente como se lo haya indicado el médico. Use la fuente de calor que el médico le recomiende, como una compresa de calor húmedo o una almohadilla térmica.  ? Colóquese una toalla entre la piel y la fuente de calor.  ? Aplique el calor durante 20 a 30 minutos.  ? Retire   la fuente de calor si la piel se le pone de color rojo brillante. Esto es muy importante si no puede sentir el dolor, el calor ni el frío. Puede correr un riesgo mayor de sufrir quemaduras.  · Tome los medicamentos de venta libre y los recetados solamente como se lo haya indicado el médico.  · Retome sus actividades normales como se lo haya indicado el médico. Pregúntele al médico qué actividades son seguras para usted.  · Concurra a todas las visitas de control como se lo haya indicado el médico. Esto es importante.  Comuníquese con un médico si:  · Tiene escalofríos o fiebre.  · El dolor persiste o empeora.   · Tiene tos que no desaparece (es persistente).  Solicite ayuda de inmediato si:  · Le falta el aire.  Esta información no tiene como fin reemplazar el consejo del médico. Asegúrese de hacerle al médico cualquier pregunta que tenga.  Document Released: 12/30/2004 Document Revised: 06/23/2016 Document Reviewed: 07/16/2015  Elsevier Interactive Patient Education © 2018 Elsevier Inc.

## 2017-11-15 NOTE — Progress Notes (Signed)
Subjective:  Patient ID: Carol Finley, female    DOB: 04/11/1950  Age: 67 y.o. MRN: [id number]  CC: Diabetes   HPI Calvin Chura is a 67 year old female with a history of type 2 diabetes mellitus (A1c 7.3), hypertension, hyperlipidemia, diabetic neuropathy, diastolic dysfunction (EF 55 to 60% from echo 11/2014) here for follow-up visit. Her A1c is 7.3 which has improved from 8.7 previously and she denies hypoglycemia, numbness in extremities or visual concerns. She is doing well on her antihypertensive and her statin and denies myalgias. She has had intermittent left inframammary chest pain which is worse when she pushes on it and this has been present for the last couple of weeks but she denies shortness of breath, pedal edema or weight gain.  Past Medical History:  Diagnosis Date  . BACTERIAL PNEUMONIA 10/09/2009   Qualifier: Diagnosis of  By: Huntley Dec, Scott    . Diabetes mellitus without complication (HCC)   . Hypertension     History reviewed. No pertinent surgical history.   No Known Allergies Outpatient Medications Prior to Visit  Medication Sig Dispense Refill  . albuterol (PROVENTIL HFA;VENTOLIN HFA) 108 (90 Base) MCG/ACT inhaler Inhale 2 puffs into the lungs every 6 (six) hours as needed for wheezing or shortness of breath. 1 Inhaler 1  . aspirin 81 MG tablet Take 1 tablet (81 mg total) by mouth daily. 30 tablet 5  . atorvastatin (LIPITOR) 80 MG tablet Take 1 tablet (80 mg total) by mouth daily. 30 tablet 6  . Blood Glucose Monitoring Suppl (TRUE METRIX METER) DEVI 1 each by Does not apply route 3 (three) times daily. 1 Device 0  . cetirizine (ZYRTEC) 10 MG tablet Take 1 tablet (10 mg total) by mouth daily. 30 tablet 1  . cloNIDine (CATAPRES) 0.3 MG tablet Take 1 tablet (0.3 mg total) by mouth 3 (three) times daily. 90 tablet 6  . ferrous sulfate 325 (65 FE) MG tablet Take 1 tablet (325 mg total) by mouth daily with breakfast. 100 tablet 1  .  furosemide (LASIX) 20 MG tablet Take 1 tablet (20 mg total) by mouth daily. 30 tablet 6  . gabapentin (NEURONTIN) 300 MG capsule Take 2 capsules (600 mg total) by mouth 3 (three) times daily. 180 capsule 6  . glucose blood (TRUE METRIX BLOOD GLUCOSE TEST) test strip 1 each by Other route 3 (three) times daily. 100 each 12  . glucose blood test strip Use as instructed 100 each 12  . insulin NPH-regular Human (NOVOLIN 70/30) (70-30) 100 UNIT/ML injection Inject 20 Units into the skin 2 (two) times daily with a meal. 30 mL 6  . Insulin Syringe-Needle U-100 (BD INSULIN SYRINGE ULTRAFINE) 31G X 15/64" 0.5 ML MISC Use as directed 100 each 2  . isosorbide mononitrate (IMDUR) 30 MG 24 hr tablet Take 1 tablet (30 mg total) by mouth daily. 30 tablet 6  . lisinopril (PRINIVIL,ZESTRIL) 40 MG tablet Take 1 tablet (40 mg total) by mouth daily. 30 tablet 6  . metFORMIN (GLUCOPHAGE) 1000 MG tablet Take 1 tablet (1,000 mg total) by mouth 2 (two) times daily with a meal. 60 tablet 6  . TRUEPLUS LANCETS 28G MISC USE AS DIRECTED 100 each 11  . diphenoxylate-atropine (LOMOTIL) 2.5-0.025 MG tablet 1-2 tabs up to 4 times daily for diarrhea 30 tablet 0  . meloxicam (MOBIC) 7.5 MG tablet Take 1 tablet (7.5 mg total) by mouth daily. 30 tablet 0   No facility-administered medications prior to visit.     ROS  Review of Systems  Constitutional: Negative for activity change, appetite change and fatigue.  HENT: Negative for congestion, sinus pressure and sore throat.   Eyes: Negative for visual disturbance.  Respiratory: Negative for cough, chest tightness, shortness of breath and wheezing.   Cardiovascular: Negative for chest pain and palpitations.  Gastrointestinal: Negative for abdominal distention, abdominal pain and constipation.  Endocrine: Negative for polydipsia.  Genitourinary: Negative for dysuria and frequency.  Musculoskeletal: Negative for arthralgias and back pain.  Skin: Negative for rash.  Neurological:  Negative for tremors, light-headedness and numbness.  Hematological: Does not bruise/bleed easily.  Psychiatric/Behavioral: Negative for agitation and behavioral problems.    Objective:  BP 119/69   Pulse 66   Temp 98.3 F (36.8 C) (Oral)   Ht 5\' 1"  (1.549 m)   Wt 158 lb (71.7 kg)   SpO2 97%   BMI 29.85 kg/m   BP/Weight 11/15/2017 11/02/2017 10/27/2017  Systolic BP 119 173 183  Diastolic BP 69 76 66  Wt. (Lbs) 158 171.6 168  BMI 29.85 32.42 31.74      Physical Exam  Constitutional: She is oriented to person, place, and time. She appears well-developed and well-nourished.  Cardiovascular: Normal rate, normal heart sounds and intact distal pulses.  No murmur heard. Pulmonary/Chest: Effort normal and breath sounds normal. She has no wheezes. She has no rales. She exhibits tenderness (TTP of left inframammary region). Right breast exhibits no mass and no tenderness. Left breast exhibits no mass and no tenderness.  Abdominal: Soft. Bowel sounds are normal. She exhibits no distension and no mass. There is no tenderness.  Musculoskeletal: Normal range of motion.  Neurological: She is alert and oriented to person, place, and time.  Skin: Skin is warm and dry.  Psychiatric: She has a normal mood and affect.    Lab Results  Component Value Date   HGBA1C 7.3 (A) 11/15/2017    Assessment & Plan:   1. Type 2 diabetes mellitus with other neurologic complication, with long-term current use of insulin (HCC) Controlled with A1c of 7.3 which has improved from 8.7 previously Counseled on Diabetic diet, my plate method, 017 minutes of moderate intensity exercise/week Keep blood sugar logs with fasting goals of 80-120 mg/dl, random of less than 510 and in the event of sugars less than 60 mg/dl or greater than 258 mg/dl please notify the clinic ASAP. It is recommended that you undergo annual eye exams and annual foot exams. Pneumonia vaccine is recommended. - POCT glucose (manual entry) -  POCT glycosylated hemoglobin (Hb A1C)  2. Essential hypertension, benign Controlled Counseled on blood pressure goal of less than 130/80, low-sodium, DASH diet, medication compliance, 150 minutes of moderate intensity exercise per week. Discussed medication compliance, adverse effects.  3. Osteoarthritis of multiple joints, unspecified osteoarthritis type - meloxicam (MOBIC) 7.5 MG tablet; Take 1 tablet (7.5 mg total) by mouth daily.  Dispense: 30 tablet; Refill: 1  4. Estrogen deficiency - DG Bone Density; Future  5. Other chest pain EKG nonrevealing Likely musculoskeletal Discussed presentation to ED if symptoms persist   Meds ordered this encounter  Medications  . meloxicam (MOBIC) 7.5 MG tablet    Sig: Take 1 tablet (7.5 mg total) by mouth daily.    Dispense:  30 tablet    Refill:  1    Follow-up: Return in about 3 months (around 02/15/2018) for Follow-up of chronic medical conditions.   Hoy Register MD

## 2017-11-23 ENCOUNTER — Other Ambulatory Visit (HOSPITAL_COMMUNITY): Payer: Self-pay | Admitting: *Deleted

## 2017-11-23 DIAGNOSIS — N644 Mastodynia: Secondary | ICD-10-CM

## 2017-11-28 MED FILL — TRUE METRIX TEST STRIP: 30 days supply | Qty: 100 | Fill #1

## 2017-11-28 MED FILL — ?HUMULIN 70/30 VIAL: (70-30) 100 | 25 days supply | Qty: 10 | Fill #3

## 2017-12-02 ENCOUNTER — Ambulatory Visit: Payer: Self-pay | Attending: Family Medicine | Admitting: Pharmacist

## 2017-12-02 ENCOUNTER — Encounter: Payer: Self-pay | Admitting: Pharmacist

## 2017-12-02 DIAGNOSIS — Z23 Encounter for immunization: Secondary | ICD-10-CM | POA: Insufficient documentation

## 2017-12-02 MED FILL — ?METFORMIN HCL 1000MG TABS: 1000 | 30 days supply | Qty: 60 | Fill #1

## 2017-12-02 MED FILL — ?CETIRIZINE HCL 10 MG TABLE: 10 | 30 days supply | Qty: 30 | Fill #1

## 2017-12-02 MED FILL — !VENTOLIN HFA INHALER: 108 (90 BAS | 25 days supply | Qty: 18 | Fill #1

## 2017-12-02 MED FILL — ?CLONIDINE HCL 0.3 MG TAB: 0.3 | 30 days supply | Qty: 90 | Fill #5

## 2017-12-02 MED FILL — ISOSORBIDE MN ER 30 MG TAB: 30 | 30 days supply | Qty: 30 | Fill #6

## 2017-12-02 MED FILL — ?FUROSEMIDE 20 MG TABLET: 20 | 30 days supply | Qty: 30 | Fill #0

## 2017-12-02 MED FILL — GABAPENTIN 300 MG CAPSULE: 300 | 30 days supply | Qty: 180 | Fill #2

## 2017-12-02 MED FILL — LISINOPRIL 40 MG TABLET: 40 | 30 days supply | Qty: 30 | Fill #0

## 2017-12-02 NOTE — Progress Notes (Signed)
Patient presents for pneumonia vaccination. Pneumovax indicated at this time. Consent given; no contraindications present at this time. Vaccine administered. Patient monitored and discharged stable.

## 2018-01-02 MED FILL — metFORMIN HCL 1000 MG TABS: 1000 | 30 days supply | Qty: 60 | Fill #2

## 2018-01-02 MED FILL — GABAPENTIN 300 MG CAPSULE: 300 | 30 days supply | Qty: 180 | Fill #3

## 2018-01-02 MED FILL — FERROUS SULFATE 325 MG TAB: 325 (65 FE) | 30 days supply | Qty: 30 | Fill #5

## 2018-01-02 MED FILL — FUROSEMIDE 20 MG TABLET: 20 | 30 days supply | Qty: 30 | Fill #1

## 2018-01-02 MED FILL — ATORVASTATIN 80 MG TABLET: 80 | 30 days supply | Qty: 30 | Fill #1

## 2018-01-02 MED FILL — !HUMULIN 70/30 VIAL: 70/30 | 25 days supply | Qty: 10 | Fill #4

## 2018-01-02 MED FILL — cloNIDine HCL 0.3 MG TABS: 0.3 | 30 days supply | Qty: 90 | Fill #6

## 2018-01-02 MED FILL — LISINOPRIL 40 MG TABLET: 40 | 30 days supply | Qty: 30 | Fill #1

## 2018-01-05 ENCOUNTER — Encounter (HOSPITAL_COMMUNITY): Payer: Self-pay

## 2018-01-05 ENCOUNTER — Other Ambulatory Visit (HOSPITAL_COMMUNITY): Payer: Self-pay | Admitting: Obstetrics and Gynecology

## 2018-01-05 ENCOUNTER — Ambulatory Visit
Admission: RE | Admit: 2018-01-05 | Discharge: 2018-01-05 | Disposition: A | Discharge status: Home / Self Care | Payer: No Typology Code available for payment source | Source: Ambulatory Visit | Attending: Obstetrics and Gynecology | Admitting: Obstetrics and Gynecology

## 2018-01-05 ENCOUNTER — Ambulatory Visit (HOSPITAL_COMMUNITY)
Admission: RE | Admit: 2018-01-05 | Discharge: 2018-01-05 | Disposition: A | Discharge status: Home / Self Care | Payer: Self-pay | Source: Ambulatory Visit | Attending: Obstetrics and Gynecology | Admitting: Obstetrics and Gynecology

## 2018-01-05 ENCOUNTER — Ambulatory Visit: Payer: Self-pay

## 2018-01-05 VITALS — BP 128/76 | Wt 173.2 lb

## 2018-01-05 DIAGNOSIS — N644 Mastodynia: Secondary | ICD-10-CM

## 2018-01-05 DIAGNOSIS — Z1239 Encounter for other screening for malignant neoplasm of breast: Secondary | ICD-10-CM

## 2018-01-05 NOTE — Patient Instructions (Signed)
Explained breast self awareness with Carol Finley. No Pap smear completed today since patient is over 65, had regular Pap smears, and no history of an abnormal Pap smear. Patient advised to follow-up with her PCP to determine if she needs any further Pap smears. Referred patient to the [facility] for a left breast diagnostic mammogram and possible ultrasound. Appointment scheduled for Thursday, January 05, 2018 at 1320. Carol Finley verbalized understanding.  Destinie Thornsberry, Kathaleen Maser, RN 1:02 PM

## 2018-01-05 NOTE — Progress Notes (Signed)
Complaints of left upper and lower breast pain x one month that comes and goes. Patient rates the pain at a 8 out of 10.   Pap Smear: Pap smear not completed today. Last Pap smear was 05/05/2012 at [facility] and normal with negative HPV. Per patient has no history of an abnormal Pap smear. Last Pap smear result is in Epic.  Physical exam: Breasts Breasts symmetrical. No skin abnormalities bilateral breasts. Nipple retraction bilateral breasts that per patient is normal for her. No nipple discharge bilateral breasts. No lymphadenopathy. No lumps palpated bilateral breasts. Complaints of left upper outer breast pain on exam. Referred patient to the [facility] for a left breast diagnostic mammogram and possible ultrasound. Appointment scheduled for Thursday, January 05, 2018 at 1320.        Pelvic/Bimanual No Pap smear completed today since patient is over 65, had regular Pap smears, and no history of an abnormal Pap smear. Patient advised to follow-up with her PCP to determine if she needs any further Pap smears.  Smoking History: Patient has never smoked.  Patient Navigation: Patient education provided. Access to services provided for patient through [facility] program. Spanish interpreter provided.   Colorectal Cancer Screening: Patient had a colonoscopy completed 08/21/2009. No complaints today. FIT Test given to patient to complete and return to [facility].  Breast and Cervical Cancer Risk Assessment: Patient has no family history of breast cancer, known genetic mutations, or radiation treatment to the chest before age 1. Patient has no history of cervical dysplasia, immunocompromised, or DES exposure in-utero. Risk Assessment    Risk Scores      01/05/2018   Last edited by: Lynnell Dike, LPN   5-year risk: 1.3 %   Lifetime risk: 4.4 %         Used Spanish interpreter Natale Lay from [employer].

## 2018-01-10 ENCOUNTER — Other Ambulatory Visit: Payer: Self-pay | Admitting: Obstetrics and Gynecology

## 2018-01-11 ENCOUNTER — Ambulatory Visit: Payer: Self-pay | Attending: Family Medicine

## 2018-01-12 ENCOUNTER — Other Ambulatory Visit: Payer: Self-pay | Admitting: Family Medicine

## 2018-01-12 MED FILL — ALBUTEROL SULFATE HFA 108 (: 108 (90 BAS | 25 days supply | Qty: 18 | Fill #0

## 2018-01-19 LAB — FECAL OCCULT BLOOD, IMMUNOCHEMICAL: Fecal Occult Bld: NEGATIVE

## 2018-01-19 MED FILL — IBUPROFEN 800 MG TABLET: 800 | 7 days supply | Qty: 28 | Fill #0

## 2018-01-23 MED FILL — ?HUMULIN 70/30 VIAL: (70-30) 100 | 25 days supply | Qty: 10 | Fill #5

## 2018-01-30 ENCOUNTER — Encounter (HOSPITAL_COMMUNITY): Payer: Self-pay | Admitting: *Deleted

## 2018-01-30 MED FILL — ?METFORMIN HCL 1000MG TABS: 1000 | 30 days supply | Qty: 60 | Fill #3

## 2018-01-30 MED FILL — GABAPENTIN 300 MG CAPSULE: 300 | 30 days supply | Qty: 180 | Fill #4

## 2018-01-30 MED FILL — ?FUROSEMIDE 20 MG TABLET: 20 | 30 days supply | Qty: 30 | Fill #2

## 2018-01-30 MED FILL — LISINOPRIL 40 MG TABLET: 40 | 30 days supply | Qty: 30 | Fill #2

## 2018-01-30 MED FILL — ATORVASTATIN 80 MG TABLET: 80 | 30 days supply | Qty: 30 | Fill #2

## 2018-01-30 MED FILL — ?CLONIDINE HCL 0.3 MG TAB: 0.3 | 30 days supply | Qty: 90 | Fill #0

## 2018-01-30 NOTE — Progress Notes (Signed)
Letter mailed to patient with negative Fit Test results.  

## 2018-02-01 ENCOUNTER — Encounter (HOSPITAL_COMMUNITY): Payer: Self-pay | Admitting: *Deleted

## 2018-02-02 MED FILL — TRUE METRIX TEST STRIP: 30 days supply | Qty: 100 | Fill #2

## 2018-02-08 ENCOUNTER — Emergency Department (HOSPITAL_COMMUNITY): Payer: Medicaid Other

## 2018-02-08 ENCOUNTER — Other Ambulatory Visit: Payer: Self-pay

## 2018-02-08 ENCOUNTER — Encounter (HOSPITAL_COMMUNITY): Payer: Self-pay | Admitting: Emergency Medicine

## 2018-02-08 ENCOUNTER — Other Ambulatory Visit (HOSPITAL_BASED_OUTPATIENT_CLINIC_OR_DEPARTMENT_OTHER): Payer: Self-pay

## 2018-02-08 ENCOUNTER — Inpatient Hospital Stay (HOSPITAL_COMMUNITY)
Admission: EM | Admit: 2018-02-08 | Discharge: 2018-02-11 | DRG: 286 | Disposition: A | Discharge status: Home / Self Care | Payer: Medicaid Other | Attending: Internal Medicine | Admitting: Internal Medicine

## 2018-02-08 ENCOUNTER — Ambulatory Visit (HOSPITAL_COMMUNITY): Payer: Medicaid Other

## 2018-02-08 DIAGNOSIS — I160 Hypertensive urgency: Secondary | ICD-10-CM

## 2018-02-08 DIAGNOSIS — J810 Acute pulmonary edema: Secondary | ICD-10-CM

## 2018-02-08 DIAGNOSIS — R9431 Abnormal electrocardiogram [ECG] [EKG]: Secondary | ICD-10-CM

## 2018-02-08 DIAGNOSIS — J9601 Acute respiratory failure with hypoxia: Secondary | ICD-10-CM | POA: Diagnosis present

## 2018-02-08 DIAGNOSIS — I2510 Atherosclerotic heart disease of native coronary artery without angina pectoris: Secondary | ICD-10-CM | POA: Diagnosis present

## 2018-02-08 DIAGNOSIS — J9600 Acute respiratory failure, unspecified whether with hypoxia or hypercapnia: Secondary | ICD-10-CM

## 2018-02-08 DIAGNOSIS — Z79899 Other long term (current) drug therapy: Secondary | ICD-10-CM

## 2018-02-08 DIAGNOSIS — IMO0002 Reserved for concepts with insufficient information to code with codable children: Secondary | ICD-10-CM | POA: Diagnosis present

## 2018-02-08 DIAGNOSIS — I110 Hypertensive heart disease with heart failure: Principal | ICD-10-CM | POA: Diagnosis present

## 2018-02-08 DIAGNOSIS — I5021 Acute systolic (congestive) heart failure: Secondary | ICD-10-CM

## 2018-02-08 DIAGNOSIS — I5043 Acute on chronic combined systolic (congestive) and diastolic (congestive) heart failure: Secondary | ICD-10-CM | POA: Diagnosis not present

## 2018-02-08 DIAGNOSIS — Z8249 Family history of ischemic heart disease and other diseases of the circulatory system: Secondary | ICD-10-CM

## 2018-02-08 DIAGNOSIS — Z833 Family history of diabetes mellitus: Secondary | ICD-10-CM

## 2018-02-08 DIAGNOSIS — I161 Hypertensive emergency: Secondary | ICD-10-CM | POA: Diagnosis not present

## 2018-02-08 DIAGNOSIS — I5033 Acute on chronic diastolic (congestive) heart failure: Secondary | ICD-10-CM

## 2018-02-08 DIAGNOSIS — E1165 Type 2 diabetes mellitus with hyperglycemia: Secondary | ICD-10-CM | POA: Diagnosis present

## 2018-02-08 DIAGNOSIS — Z7982 Long term (current) use of aspirin: Secondary | ICD-10-CM

## 2018-02-08 DIAGNOSIS — E876 Hypokalemia: Secondary | ICD-10-CM | POA: Diagnosis present

## 2018-02-08 DIAGNOSIS — I10 Essential (primary) hypertension: Secondary | ICD-10-CM | POA: Diagnosis present

## 2018-02-08 DIAGNOSIS — E785 Hyperlipidemia, unspecified: Secondary | ICD-10-CM | POA: Diagnosis present

## 2018-02-08 DIAGNOSIS — Z794 Long term (current) use of insulin: Secondary | ICD-10-CM

## 2018-02-08 DIAGNOSIS — Z791 Long term (current) use of non-steroidal anti-inflammatories (NSAID): Secondary | ICD-10-CM

## 2018-02-08 DIAGNOSIS — I340 Nonrheumatic mitral (valve) insufficiency: Secondary | ICD-10-CM

## 2018-02-08 DIAGNOSIS — I5030 Unspecified diastolic (congestive) heart failure: Secondary | ICD-10-CM

## 2018-02-08 DIAGNOSIS — I447 Left bundle-branch block, unspecified: Secondary | ICD-10-CM | POA: Diagnosis present

## 2018-02-08 HISTORY — DX: Heart failure, unspecified: I50.9

## 2018-02-08 LAB — HEPATIC FUNCTION PANEL
ALK PHOS: 97 U/L (ref 38–126)
ALT: 26 U/L (ref 0–44)
AST: 56 U/L — ABNORMAL HIGH (ref 15–41)
Albumin: 3.6 g/dL (ref 3.5–5.0)
BILIRUBIN INDIRECT: 1.1 mg/dL — AB (ref 0.3–0.9)
BILIRUBIN TOTAL: 1.6 mg/dL — AB (ref 0.3–1.2)
Bilirubin, Direct: 0.5 mg/dL — ABNORMAL HIGH (ref 0.0–0.2)
Total Protein: 6.7 g/dL (ref 6.5–8.1)

## 2018-02-08 LAB — ECHOCARDIOGRAM COMPLETE
HEIGHTINCHES: 63 in
WEIGHTICAEL: 2652.8 [oz_av]

## 2018-02-08 LAB — CBC WITH DIFFERENTIAL/PLATELET
Abs Immature Granulocytes: 0.03 10*3/uL (ref 0.00–0.07)
Basophils Absolute: 0 10*3/uL (ref 0.0–0.1)
Basophils Relative: 0 %
Eosinophils Absolute: 0.3 10*3/uL (ref 0.0–0.5)
Eosinophils Relative: 3 %
HCT: 43.8 % (ref 36.0–46.0)
Hemoglobin: 13.8 g/dL (ref 12.0–15.0)
Immature Granulocytes: 0 %
Lymphocytes Relative: 38 %
Lymphs Abs: 3.9 10*3/uL (ref 0.7–4.0)
MCH: 28.3 pg (ref 26.0–34.0)
MCHC: 31.5 g/dL (ref 30.0–36.0)
MCV: 89.8 fL (ref 80.0–100.0)
Monocytes Absolute: 0.7 10*3/uL (ref 0.1–1.0)
Monocytes Relative: 7 %
Neutro Abs: 5.4 10*3/uL (ref 1.7–7.7)
Neutrophils Relative %: 52 %
Platelets: 242 10*3/uL (ref 150–400)
RBC: 4.88 MIL/uL (ref 3.87–5.11)
RDW: 14.6 % (ref 11.5–15.5)
WBC: 10.4 10*3/uL (ref 4.0–10.5)
nRBC: 0 % (ref 0.0–0.2)

## 2018-02-08 LAB — BASIC METABOLIC PANEL
Anion gap: 9 (ref 5–15)
BUN: 11 mg/dL (ref 8–23)
CHLORIDE: 101 mmol/L (ref 98–111)
CO2: 26 mmol/L (ref 22–32)
Calcium: 8.9 mg/dL (ref 8.9–10.3)
Creatinine, Ser: 0.66 mg/dL (ref 0.44–1.00)
GFR calc non Af Amer: >60 mL/min (ref >60)
Glucose, Bld: 205 mg/dL — ABNORMAL HIGH (ref 70–99)
POTASSIUM: 4.5 mmol/L (ref 3.5–5.1)
SODIUM: 136 mmol/L (ref 135–145)

## 2018-02-08 LAB — GLUCOSE, CAPILLARY
GLUCOSE-CAPILLARY: 541 mg/dL — AB (ref 70–99)
GLUCOSE-CAPILLARY: 403 mg/dL — AB (ref 70–99)
Glucose-Capillary: 214 mg/dL — ABNORMAL HIGH (ref 70–99)

## 2018-02-08 LAB — TROPONIN I

## 2018-02-08 LAB — BRAIN NATRIURETIC PEPTIDE: B Natriuretic Peptide: 743.8 pg/mL — ABNORMAL HIGH (ref 0.0–100.0)

## 2018-02-08 MED ORDER — SODIUM CHLORIDE 0.9% FLUSH
3.0000 mL | Freq: Two times a day (BID) | INTRAVENOUS | Status: DC
  Administered 2018-02-08 – 2018-02-10 (×3): 3 mL via INTRAVENOUS

## 2018-02-08 MED ORDER — FUROSEMIDE 10 MG/ML IJ SOLN
40.0000 mg | Freq: Once | INTRAMUSCULAR | Status: AC
  Administered 2018-02-08: 40 mg via INTRAVENOUS
  Filled 2018-02-08: qty 4

## 2018-02-08 MED ORDER — ACETAMINOPHEN 500 MG PO TABS
1000.0000 mg | ORAL_TABLET | Freq: Once | ORAL | Status: AC
  Administered 2018-02-08: 1000 mg via ORAL
  Filled 2018-02-08: qty 2

## 2018-02-08 MED ORDER — ISOSORBIDE MONONITRATE ER 30 MG PO TB24
30.0000 mg | ORAL_TABLET | Freq: Every day | ORAL | Status: DC
  Administered 2018-02-08 – 2018-02-11 (×4): 30 mg via ORAL
  Filled 2018-02-08 (×4): qty 1

## 2018-02-08 MED ORDER — CARVEDILOL 6.25 MG PO TABS
6.2500 mg | ORAL_TABLET | Freq: Two times a day (BID) | ORAL | Status: DC
  Administered 2018-02-08 – 2018-02-11 (×6): 6.25 mg via ORAL
  Filled 2018-02-08 (×6): qty 1

## 2018-02-08 MED ORDER — LORATADINE 10 MG PO TABS
10.0000 mg | ORAL_TABLET | Freq: Every day | ORAL | Status: DC
  Administered 2018-02-08 – 2018-02-11 (×4): 10 mg via ORAL
  Filled 2018-02-08 (×4): qty 1

## 2018-02-08 MED ORDER — ONDANSETRON HCL 4 MG/2ML IJ SOLN: 4.0000 mg | Freq: Four times a day (QID) | INTRAMUSCULAR | Status: DC | PRN

## 2018-02-08 MED ORDER — FUROSEMIDE 10 MG/ML IJ SOLN
40.0000 mg | Freq: Two times a day (BID) | INTRAMUSCULAR | Status: DC
  Administered 2018-02-08 – 2018-02-11 (×6): 40 mg via INTRAVENOUS
  Filled 2018-02-08 (×6): qty 4

## 2018-02-08 MED ORDER — ENOXAPARIN SODIUM 40 MG/0.4ML ~~LOC~~ SOLN
40.0000 mg | SUBCUTANEOUS | Status: DC
  Administered 2018-02-08 – 2018-02-09 (×2): 40 mg via SUBCUTANEOUS
  Filled 2018-02-08 (×3): qty 0.4

## 2018-02-08 MED ORDER — LISINOPRIL 40 MG PO TABS
40.0000 mg | ORAL_TABLET | Freq: Every day | ORAL | Status: DC
  Administered 2018-02-08 – 2018-02-11 (×4): 40 mg via ORAL
  Filled 2018-02-08 (×3): qty 1
  Filled 2018-02-08: qty 2
  Filled 2018-02-08: qty 1

## 2018-02-08 MED ORDER — INSULIN ASPART PROT & ASPART (70-30 MIX) 100 UNIT/ML ~~LOC~~ SUSP
20.0000 [IU] | Freq: Two times a day (BID) | SUBCUTANEOUS | Status: DC
  Administered 2018-02-08 – 2018-02-11 (×5): 20 [IU] via SUBCUTANEOUS
  Filled 2018-02-08: qty 10

## 2018-02-08 MED ORDER — SODIUM CHLORIDE 0.9 % IV SOLN: 250.0000 mL | INTRAVENOUS | Status: DC | PRN

## 2018-02-08 MED ORDER — ASPIRIN EC 81 MG PO TBEC
81.0000 mg | DELAYED_RELEASE_TABLET | Freq: Every day | ORAL | Status: DC
  Administered 2018-02-08 – 2018-02-11 (×3): 81 mg via ORAL
  Filled 2018-02-08 (×4): qty 1

## 2018-02-08 MED ORDER — SODIUM CHLORIDE 0.9% FLUSH
3.0000 mL | INTRAVENOUS | Status: DC | PRN
  Administered 2018-02-09: 3 mL via INTRAVENOUS
  Filled 2018-02-08: qty 3

## 2018-02-08 MED ORDER — NITROGLYCERIN IN D5W 200-5 MCG/ML-% IV SOLN
0.0000 ug/min | Freq: Once | INTRAVENOUS | Status: AC
  Administered 2018-02-08: 50 ug/min via INTRAVENOUS
  Filled 2018-02-08: qty 250

## 2018-02-08 MED ORDER — ACETAMINOPHEN 325 MG PO TABS
650.0000 mg | ORAL_TABLET | ORAL | Status: DC | PRN
  Administered 2018-02-10: 650 mg via ORAL
  Filled 2018-02-08 (×2): qty 2

## 2018-02-08 MED ORDER — CLONIDINE HCL 0.3 MG PO TABS
0.3000 mg | ORAL_TABLET | Freq: Three times a day (TID) | ORAL | Status: DC
  Administered 2018-02-08 – 2018-02-11 (×9): 0.3 mg via ORAL
  Filled 2018-02-08 (×9): qty 1

## 2018-02-08 MED ORDER — INSULIN ASPART 100 UNIT/ML ~~LOC~~ SOLN
0.0000 [IU] | Freq: Three times a day (TID) | SUBCUTANEOUS | Status: DC
  Administered 2018-02-08 (×2): 15 [IU] via SUBCUTANEOUS
  Administered 2018-02-09: 3 [IU] via SUBCUTANEOUS
  Administered 2018-02-09: 11 [IU] via SUBCUTANEOUS
  Administered 2018-02-09: 5 [IU] via SUBCUTANEOUS
  Administered 2018-02-10: 8 [IU] via SUBCUTANEOUS
  Administered 2018-02-10 (×2): 2 [IU] via SUBCUTANEOUS
  Administered 2018-02-11: 3 [IU] via SUBCUTANEOUS

## 2018-02-08 MED ORDER — FERROUS SULFATE 325 (65 FE) MG PO TABS
325.0000 mg | ORAL_TABLET | Freq: Every day | ORAL | Status: DC
  Administered 2018-02-09 – 2018-02-11 (×3): 325 mg via ORAL
  Filled 2018-02-08 (×3): qty 1

## 2018-02-08 MED ORDER — GABAPENTIN 300 MG PO CAPS
600.0000 mg | ORAL_CAPSULE | Freq: Three times a day (TID) | ORAL | Status: DC
  Administered 2018-02-08 – 2018-02-11 (×8): 600 mg via ORAL
  Filled 2018-02-08 (×8): qty 2

## 2018-02-08 MED ORDER — INSULIN ASPART PROT & ASPART (70-30 MIX) 100 UNIT/ML ~~LOC~~ SUSP
20.0000 [IU] | Freq: Two times a day (BID) | SUBCUTANEOUS | Status: DC
  Filled 2018-02-08: qty 10

## 2018-02-08 MED ORDER — ALBUTEROL SULFATE (2.5 MG/3ML) 0.083% IN NEBU: 3.0000 mL | INHALATION_SOLUTION | Freq: Four times a day (QID) | RESPIRATORY_TRACT | Status: DC | PRN

## 2018-02-08 MED ORDER — INSULIN ASPART 100 UNIT/ML ~~LOC~~ SOLN
0.0000 [IU] | Freq: Every day | SUBCUTANEOUS | Status: DC
  Administered 2018-02-08: 2 [IU] via SUBCUTANEOUS

## 2018-02-08 MED ORDER — ATORVASTATIN CALCIUM 80 MG PO TABS
80.0000 mg | ORAL_TABLET | Freq: Every day | ORAL | Status: DC
  Administered 2018-02-08 – 2018-02-11 (×3): 80 mg via ORAL
  Filled 2018-02-08: qty 4
  Filled 2018-02-08 (×2): qty 1

## 2018-02-08 NOTE — Progress Notes (Signed)
Pt arrived to 3e27, pt denies pain assisted to bathroom, vss other than bp high, blood sugar 541, received solumedrol iv in ed, paged Dr Ophelia Charter

## 2018-02-08 NOTE — ED Notes (Signed)
Per MD, begin to wean pt down off of nitro drip. Pt reports headache has improved.

## 2018-02-08 NOTE — Progress Notes (Signed)
Pt ate only little lunch , grandaughter says she left her dentures at home and it is hard for her to chew, we discussed diet options and soft diet with carb mod restrictions ordered for pt

## 2018-02-08 NOTE — Consult Note (Addendum)
Cardiology Consultation:   Patient ID: Laurinda Carreno; [id number]; 03-04-1951   Admit date: 02/08/2018 Date of Consult: 02/08/2018  Primary Care Provider: Hoy Register, MD Primary Cardiologist: Remotely seen by Tereso Newcomer, PA-C Primary Electrophysiologist:  None   Patient Profile:   Tehya Leath is a 67 y.o. female with a PMH of chronic diastolic CHF, HTN, HLD,  who is being seen today for the evaluation of CHF exacerbation at the request of Dr. Clyde Lundborg.  History of Present Illness:   Ms. Lisette Abu granddaughter at bedsided assisted with translation. She presented with sudden onset SOB which woke her from sleep around 3:30am 02/08/18. She reported associated wheezing and orthopnea. She reported feeling fine yesterday. No complaints of chest pain, LE edema, fever, coughing, rhinorrhea, DOE, PND, weight gain, dizziness, lightheadedness, or syncope. EMS was activated and she was placed on Bipap prior to arrival with improvement in respiratory status.  She follows closely with her PCP but has not seen a cardiologist since an admission in 2016 for chest pain. She had a NST at that time which was felt to have diaphragmatic and breast attenuation but negative for inducible ischemia. Echo in 2016 with EF 55-60%, no wall motion abnormalities, and G1DD.   At the time of this evaluation she reports significant improvement in her breathing. She has chronic dependent LE edema, for which she takes lasix 20mg  daily and has been unchanged in recent weeks. She reports compliance with her medications. She has some dietary indiscretions with salt intake.   Hospital course: Markedly hypertensive, tachycardic/tachypneic on arrival (improved), otherwise VSS. Labs notable for electrolytes wnl, Cr 0.66, BNP 743, Trop negative x1, CBC wnl. CXR with pulmonary vascular congestion and interstitial edema. EKG with sinus tachycardia with LVH, non-specific IVCD, artifact limiting  further interpretation. She was placed on BiPAP by EMS which was weaned off. She was given IV lasix 40mg  in the ED for pulmonary edema and started on nitro gtt for hypertensive emergency. Admitted to medicine with cardiology following for acute on chronic diastolic CHF.   Past Medical History:  Diagnosis Date  . BACTERIAL PNEUMONIA 10/09/2009   Qualifier: Diagnosis of  By: Huntley Dec, Scott    . CHF (congestive heart failure) (HCC)    grade 1 diastolic, 2016  . Diabetes mellitus without complication (HCC)   . Hypertension     History reviewed. No pertinent surgical history.   Home Medications:  Prior to Admission medications   Medication Sig Start Date End Date Taking? Authorizing Provider  atorvastatin (LIPITOR) 80 MG tablet Take 1 tablet (80 mg total) by mouth daily. 08/10/17  Yes Hoy Register, MD  cetirizine (ZYRTEC) 10 MG tablet Take 1 tablet (10 mg total) by mouth daily. 11/02/17  Yes Hoy Register, MD  cloNIDine (CATAPRES) 0.3 MG tablet Take 1 tablet (0.3 mg total) by mouth 3 (three) times daily. 08/10/17  Yes Hoy Register, MD  ferrous sulfate 325 (65 FE) MG tablet Take 1 tablet (325 mg total) by mouth daily with breakfast. 01/31/17  Yes McClung, Angela M, PA-C  furosemide (LASIX) 20 MG tablet Take 1 tablet (20 mg total) by mouth daily. 08/10/17  Yes Hoy Register, MD  gabapentin (NEURONTIN) 300 MG capsule Take 2 capsules (600 mg total) by mouth 3 (three) times daily. 08/10/17  Yes Hoy Register, MD  insulin NPH-regular Human (NOVOLIN 70/30) (70-30) 100 UNIT/ML injection Inject 20 Units into the skin 2 (two) times daily with a meal. 08/10/17  Yes Newlin, Enobong, MD  lisinopril (PRINIVIL,ZESTRIL) 40 MG tablet Take  1 tablet (40 mg total) by mouth daily. 08/10/17  Yes Hoy Register, MD  metFORMIN (GLUCOPHAGE) 1000 MG tablet Take 1 tablet (1,000 mg total) by mouth 2 (two) times daily with a meal. 08/10/17  Yes Newlin, Enobong, MD  VENTOLIN HFA 108 (90 Base) MCG/ACT inhaler INHALE 2 PUFFS  INTO THE LUNGS EVERY SIX HOURS AS NEEDED FOR WHEEZING OR SHORTNESS OF BREATH. Patient taking differently: Inhale 2 puffs into the lungs every 6 (six) hours as needed.  01/12/18  Yes Hoy Register, MD  aspirin 81 MG tablet Take 1 tablet (81 mg total) by mouth daily. 11/19/14   Casey Burkitt, MD  Blood Glucose Monitoring Suppl (TRUE METRIX METER) DEVI 1 each by Does not apply route 3 (three) times daily. 07/18/17   Hoy Register, MD  glucose blood (TRUE METRIX BLOOD GLUCOSE TEST) test strip 1 each by Other route 3 (three) times daily. 07/18/17   Hoy Register, MD  glucose blood test strip Use as instructed 07/18/17   Hoy Register, MD  Insulin Syringe-Needle U-100 (BD INSULIN SYRINGE ULTRAFINE) 31G X 15/64" 0.5 ML MISC Use as directed 05/18/17   Hoy Register, MD  isosorbide mononitrate (IMDUR) 30 MG 24 hr tablet Take 1 tablet (30 mg total) by mouth daily. 08/10/17   Hoy Register, MD  meloxicam (MOBIC) 7.5 MG tablet Take 1 tablet (7.5 mg total) by mouth daily. 11/15/17   Hoy Register, MD  TRUEPLUS LANCETS 28G MISC USE AS DIRECTED 07/18/17   Hoy Register, MD    Inpatient Medications: Scheduled Meds: . aspirin EC  81 mg Oral Daily  . atorvastatin  80 mg Oral Daily  . cloNIDine  0.3 mg Oral TID  . enoxaparin (LOVENOX) injection  40 mg Subcutaneous Q24H  . [START ON 02/09/2018] ferrous sulfate  325 mg Oral Q breakfast  . furosemide  40 mg Intravenous Q12H  . gabapentin  600 mg Oral TID  . insulin aspart  0-15 Units Subcutaneous TID WC  . insulin aspart  0-5 Units Subcutaneous QHS  . insulin aspart protamine- aspart  20 Units Subcutaneous BID WC  . isosorbide mononitrate  30 mg Oral Daily  . lisinopril  40 mg Oral Daily  . loratadine  10 mg Oral Daily  . sodium chloride flush  3 mL Intravenous Q12H   Continuous Infusions: . sodium chloride     PRN Meds: sodium chloride, acetaminophen, albuterol, ondansetron (ZOFRAN) IV, sodium chloride flush  Allergies:   No Known  Allergies  Social History:   Social History   Socioeconomic History  . Marital status: Widowed    Spouse name: Not on file  . Number of children: Not on file  . Years of education: Not on file  . Highest education level: Not on file  Occupational History  . Not on file  Social Needs  . Financial resource strain: Not on file  . Food insecurity:    Worry: Not on file    Inability: Not on file  . Transportation needs:    Medical: Not on file    Non-medical: Not on file  Tobacco Use  . Smoking status: Never Smoker  . Smokeless tobacco: Never Used  Substance and Sexual Activity  . Alcohol use: No  . Drug use: No  . Sexual activity: Not Currently  Lifestyle  . Physical activity:    Days per week: Not on file    Minutes per session: Not on file  . Stress: Not on file  Relationships  . Social connections:  Talks on phone: Not on file    Gets together: Not on file    Attends religious service: Not on file    Active member of club or organization: Not on file    Attends meetings of clubs or organizations: Not on file    Relationship status: Not on file  . Intimate partner violence:    Fear of current or ex partner: Not on file    Emotionally abused: Not on file    Physically abused: Not on file    Forced sexual activity: Not on file  Other Topics Concern  . Not on file  Social History Narrative  . Not on file    Family History:    Family History  Problem Relation Age of Onset  . Diabetes Mother   . Hypertension Mother   . Heart disease Mother   . Hypertension Father      ROS:  Please see the history of present illness.   All other ROS reviewed and negative.     Physical Exam/Data:   Vitals:   02/08/18 1045 02/08/18 1105 02/08/18 1145 02/08/18 1227  BP: (!) 173/80 (!) 160/71 (!) 168/70 (!) 195/87  Pulse: 86  81 75  Resp: 15  14 18   Temp:    98.7 F (37.1 C)  TempSrc:    Oral  SpO2: 99%  98% 100%  Weight:        Intake/Output Summary (Last 24  hours) at 02/08/2018 1238 Last data filed at 02/08/2018 1207 Gross per 24 hour  Intake 3 ml  Output 2850 ml  Net -2847 ml   Filed Weights   02/08/18 1034  Weight: 75.2 kg   Body mass index is 31.33 kg/m.  General:  Well nourished, well developed, laying in bed in no acute distress HEENT: sclera anicteric  Neck: mild JVD Vascular: No carotid bruits; distal pulses 2+ bilaterally Cardiac:  normal S1, S2; RRR; +soft murmur, no rubs or gallops Lungs:  clear to auscultation bilaterally with fine crackles at lung bases; no wheezing or rhonchi  Abd: NABS, soft, nontender, no hepatomegaly Ext: no edema Musculoskeletal:  No deformities, BUE and BLE strength normal and equal Skin: warm and dry  Neuro:  CNs 2-12 intact, no focal abnormalities noted Psych:  Normal affect   EKG:  The EKG was personally reviewed and demonstrates:  sinus tachycardia with LVH, non-specific IVCD, artifact limiting further interpretation Telemetry:  Telemetry was personally reviewed and demonstrates:  Sinus rhythm with   Relevant CV Studies: Echocardiogram 2016: Study Conclusions  - Left ventricle: The cavity size was normal. There was severe concentric hypertrophy. Systolic function was normal. The estimated ejection fraction was in the range of 55% to 60%. Wall motion was normal; there were no regional wall motion abnormalities. There was an increased relative contribution of atrial contraction to ventricular filling. Doppler parameters are consistent with abnormal left ventricular relaxation (grade 1 diastolic dysfunction). - Aortic valve: Trileaflet; normal thickness, mildly calcified   leaflets. - Left atrium: The atrium was mildly dilated.  NST 2016:  Nuclear stress EF: 50%.  There was no ST segment deviation noted during stress.  Defect 1: There is a large defect of moderate severity present in the basal inferior, basal inferolateral, mid inferior, mid inferolateral and apical inferior  location.  Defect 2: There is a small defect of mild severity present in the mid anteroseptal location.  This is a low risk study.  The left ventricular ejection fraction is mildly decreased (45-54%).  Defects  likely due to diaphragmatic and breast attenuation, but cannot rule out infarct. There is no evidence of inducible ischemia.  Laboratory Data:  Chemistry Recent Labs  Lab 02/08/18 0446  NA 136  K 4.5  CL 101  CO2 26  GLUCOSE 205*  BUN 11  CREATININE 0.66  CALCIUM 8.9  GFRNONAA >60  GFRAA >60  ANIONGAP 9    Recent Labs  Lab 02/08/18 0446  PROT 6.7  ALBUMIN 3.6  AST 56*  ALT 26  ALKPHOS 97  BILITOT 1.6*   Hematology Recent Labs  Lab 02/08/18 0446  WBC 10.4  RBC 4.88  HGB 13.8  HCT 43.8  MCV 89.8  MCH 28.3  MCHC 31.5  RDW 14.6  PLT 242   Cardiac Enzymes Recent Labs  Lab 02/08/18 0446  TROPONINI <0.03   No results for input(s): TROPIPOC in the last 168 hours.  BNP Recent Labs  Lab 02/08/18 0446  BNP 743.8*    DDimer No results for input(s): DDIMER in the last 168 hours.  Radiology/Studies:  Dg Chest Port 1 View  Result Date: 02/08/2018 CLINICAL DATA:  Acute onset shortness of breath 1 hour ago. EXAM: PORTABLE CHEST 1 VIEW COMPARISON:  11/11/2014 FINDINGS: Cardiac enlargement with pulmonary vascular congestion. Interstitial edema. No consolidation. No blunting of costophrenic angles. No pneumothorax. Mediastinal contours appear intact. Calcification of the aorta. Degenerative changes in the spine and shoulders. IMPRESSION: Cardiac enlargement with pulmonary vascular congestion and interstitial edema. Electronically Signed   By: Burman Nieves M.D.   On: 02/08/2018 04:50    Assessment and Plan:   1. Acute on chronic diastolic CHF: patient presented with acute onset SOB which woke her from sleep at 3am. BNP elevated to 700s. CXR with pulmonary edema. She was weaned off BiPAP and received IV lasix 40mg . She is diuresing well with UOP -2.7L  since presentation to the ED. Last echo in 2016 with EF 55-60%, G1DD, no wall motion abnormalities, and severe LVH. Repeat echo pending. Likely this was driven by hypertensive urgency.  - Continue lasix 40mg  IV BID - anticipate transition to po tomorrow - Will follow-up echocardiogram - Will plan for NST in AM to rule out ischemic cause given multiple risk factors.  - Start carvedilol 6.25mg  BID - Continue lisionpril - Continue to monitor strict I&Os and daily weights - Continue to monitor electrolytes closely to maintain K >4, Mg >2  2. Hypertensive urgency: BP markedly elevated. Briefly on nitro gtt. Home clonidine, lisinopril, and imdur continued. - Start carvedilol 6.25mg  BID - Continue clonidine, lisinopril, and imdur for now - Further medication adjustments pending above work-up  3. DM type 2: Last A1C 8.7 08/2017; goal <7 - Continue management per primary team  4. HLD: LDL 105 08/2017; goal <100. Atorvastatin discontinued due to myalgias  - Consider zetia for risk modifications  5. Soft murmur on exam:  - Echo pending       For questions or updates, please contact [facility] Please consult [email or web address] for contact info under Cardiology/STEMI.   Signed, Beatriz Stallion, PA-C  02/08/2018 12:38 PM [phone]  Patient examined chart reviewed. Discussed care wit PA and grand daughter who interprets for her and lives with her Known severe HTN on multiple meds. Compliant Only cooks with salt none added. Sudden onset dyspnea last night couldn't sleep Seen in ER with HTN urgency and CHF. ECG with chronic LVH strain r/o Denies chest pain Responded to lasix, nitro and bipap. Having TTE now. Exam with elderly [place] female basilar  crackles S4 gallop soft abdomen no renal bruits no edema good peripheral pulses. Will review TTE when done if EF normal do lexiscan myovue in am to r/o CAD given risk factors. Add coreg bid to current regimen. Reviewed her myovue from 2016 and had  inferior lateral defect at mid and basal level no ischemia thought to be soft tissue attenuation No clinical history of MI  Charlton Haws

## 2018-02-08 NOTE — Progress Notes (Signed)
  Echocardiogram 2D Echocardiogram has been performed.  Carol Finley 02/08/2018, 3:38 PM

## 2018-02-08 NOTE — ED Provider Notes (Signed)
[facility] EMERGENCY DEPARTMENT Provider Note   CSN: [id number] Arrival date & time: 02/08/18  0434   History   Chief Complaint Chief Complaint  Patient presents with  . Respiratory Distress   Level 5 caveat due to respiratory distress HPI Carol Finley is a 67 y.o. female.  The history is provided by the patient and a relative. The history is limited by the condition of the patient.  Shortness of Breath  This is a new problem. The average episode lasts 1 hour. The problem occurs continuously.The problem has been rapidly worsening. Associated symptoms include cough.  Patient with history of diabetes and hypertension presents with shortness of breath.  Granddaughter is at bedside who is most history.  She reports her grandmother has been having recent shortness of breath and was told she may have pneumonia.  Tonight she woke up at 3 AM and had significant shortness of breath.  EMS was called, she was placed on CPAP and given nebulized therapy/magnesium/Solu-Medrol No recent travel.  No smoking/vaping   Past Medical History:  Diagnosis Date  . BACTERIAL PNEUMONIA 10/09/2009   Qualifier: Diagnosis of  By: Huntley Dec, Scott    . CHF (congestive heart failure) (HCC)   . Diabetes mellitus without complication (HCC)   . Hypertension     Patient Active Problem List   Diagnosis Date Noted  . Helicobacter pylori gastritis 04/11/2017  . Gastroenteritis 09/06/2016  . Blurred vision 08/19/2016  . Abdominal pain, periumbilical 08/19/2016  . Epistaxis 07/13/2016  . Insomnia 05/10/2016  . Acute pulpitis 01/29/2016  . Osteoarthritis 08/29/2015  . Routine screening for STI (sexually transmitted infection) 04/16/2015  . Diastolic dysfunction 11/19/2014  . Neuropathy 06/20/2013  . Right knee pain 06/20/2013  . Decreased visual acuity 11/15/2012  . Hyperlipidemia 08/08/2012  . Annual physical exam 05/07/2012  . ALLERGIC RHINITIS 06/19/2010  . OBESITY  03/23/2010  . ONYCHOMYCOSIS, BILATERAL 03/03/2009  . Diabetes type 2, uncontrolled (HCC) 12/02/2008  . Essential hypertension, benign 12/02/2008    History reviewed. No pertinent surgical history.   OB History    Gravida  3   Para      Term      Preterm      AB      Living  3     SAB      TAB      Ectopic      Multiple      Live Births  3            Home Medications    Prior to Admission medications   Medication Sig Start Date End Date Taking? Authorizing Provider  aspirin 81 MG tablet Take 1 tablet (81 mg total) by mouth daily. 11/19/14   Casey Burkitt, MD  atorvastatin (LIPITOR) 80 MG tablet Take 1 tablet (80 mg total) by mouth daily. 08/10/17   Hoy Register, MD  Blood Glucose Monitoring Suppl (TRUE METRIX METER) DEVI 1 each by Does not apply route 3 (three) times daily. 07/18/17   Hoy Register, MD  cetirizine (ZYRTEC) 10 MG tablet Take 1 tablet (10 mg total) by mouth daily. 11/02/17   Hoy Register, MD  cloNIDine (CATAPRES) 0.3 MG tablet Take 1 tablet (0.3 mg total) by mouth 3 (three) times daily. 08/10/17   Hoy Register, MD  ferrous sulfate 325 (65 FE) MG tablet Take 1 tablet (325 mg total) by mouth daily with breakfast. 01/31/17   Anders Simmonds, PA-C  furosemide (LASIX) 20 MG tablet Take 1  tablet (20 mg total) by mouth daily. 08/10/17   Hoy Register, MD  gabapentin (NEURONTIN) 300 MG capsule Take 2 capsules (600 mg total) by mouth 3 (three) times daily. 08/10/17   Hoy Register, MD  glucose blood (TRUE METRIX BLOOD GLUCOSE TEST) test strip 1 each by Other route 3 (three) times daily. 07/18/17   Hoy Register, MD  glucose blood test strip Use as instructed 07/18/17   Hoy Register, MD  insulin NPH-regular Human (NOVOLIN 70/30) (70-30) 100 UNIT/ML injection Inject 20 Units into the skin 2 (two) times daily with a meal. 08/10/17   Hoy Register, MD  Insulin Syringe-Needle U-100 (BD INSULIN SYRINGE ULTRAFINE) 31G X 15/64" 0.5 ML MISC Use as  directed 05/18/17   Hoy Register, MD  isosorbide mononitrate (IMDUR) 30 MG 24 hr tablet Take 1 tablet (30 mg total) by mouth daily. 08/10/17   Hoy Register, MD  lisinopril (PRINIVIL,ZESTRIL) 40 MG tablet Take 1 tablet (40 mg total) by mouth daily. 08/10/17   Hoy Register, MD  meloxicam (MOBIC) 7.5 MG tablet Take 1 tablet (7.5 mg total) by mouth daily. 11/15/17   Hoy Register, MD  metFORMIN (GLUCOPHAGE) 1000 MG tablet Take 1 tablet (1,000 mg total) by mouth 2 (two) times daily with a meal. 08/10/17   Hoy Register, MD  TRUEPLUS LANCETS 28G MISC USE AS DIRECTED 07/18/17   Hoy Register, MD  VENTOLIN HFA 108 (90 Base) MCG/ACT inhaler INHALE 2 PUFFS INTO THE LUNGS EVERY SIX HOURS AS NEEDED FOR WHEEZING OR SHORTNESS OF BREATH. 01/12/18   Hoy Register, MD    Family History Family History  Problem Relation Age of Onset  . Diabetes Mother   . Hypertension Mother   . Heart disease Mother   . Hypertension Father     Social History Social History   Tobacco Use  . Smoking status: Never Smoker  . Smokeless tobacco: Never Used  Substance Use Topics  . Alcohol use: No  . Drug use: No     Allergies   Patient has no known allergies.   Review of Systems Review of Systems  Unable to perform ROS: Severe respiratory distress  Respiratory: Positive for cough and shortness of breath.      Physical Exam Updated Vital Signs BP (!) 205/158 (BP Location: Right Arm)   Pulse (!) 126   Temp (!) 97.5 F (36.4 C) (Temporal)   Resp (!) 40   SpO2 100%   Physical Exam  CONSTITUTIONAL: Elderly and anxious HEAD: Normocephalic/atraumatic EYES: EOMI/PERRL ENMT: Mucous membranes moist, CPAP mask in place NECK: supple no meningeal signs SPINE/BACK:entire spine nontender CV: S1/S2 noted, tachycardic LUNGS: Respiratory distress noted, crackles bilaterally ABDOMEN: soft, nontender, no rebound or guarding, bowel sounds noted throughout abdomen GU:no cva tenderness NEURO: Pt is  awake/alert/appropriate, moves all extremitiesx4.  No facial droop.   EXTREMITIES: pulses normal/equal, full ROM, edema noted lower extremities SKIN: warm, color normal PSYCH: anxious  ED Treatments / Results  Labs (all labs ordered are listed, but only abnormal results are displayed) Labs Reviewed  BASIC METABOLIC PANEL - Abnormal; Notable for the following components:      Result Value   Glucose, Bld 205 (*)    All other components within normal limits  BRAIN NATRIURETIC PEPTIDE - Abnormal; Notable for the following components:   B Natriuretic Peptide 743.8 (*)    All other components within normal limits  CBC WITH DIFFERENTIAL/PLATELET  TROPONIN I    EKG ED ECG REPORT   Date: 02/08/2018 0439  Rate: 121  Rhythm: sinus tachycardia  QRS Axis: normal  Intervals: normal  ST/T Wave abnormalities: nonspecific ST changes  Conduction Disutrbances:left bundle branch block  Narrative Interpretation:     I have personally reviewed the EKG tracing and agree with the computerized printout as noted.    Radiology Dg Chest Port 1 View  Result Date: 02/08/2018 CLINICAL DATA:  Acute onset shortness of breath 1 hour ago. EXAM: PORTABLE CHEST 1 VIEW COMPARISON:  11/11/2014 FINDINGS: Cardiac enlargement with pulmonary vascular congestion. Interstitial edema. No consolidation. No blunting of costophrenic angles. No pneumothorax. Mediastinal contours appear intact. Calcification of the aorta. Degenerative changes in the spine and shoulders. IMPRESSION: Cardiac enlargement with pulmonary vascular congestion and interstitial edema. Electronically Signed   By: Burman Nieves M.D.   On: 02/08/2018 04:50    Procedures Procedures  CRITICAL CARE Performed by: Joya Gaskins Total critical care time: 40 minutes Critical care time was exclusive of separately billable procedures and treating other patients. Critical care was necessary to treat or prevent imminent or life-threatening  deterioration. Critical care was time spent personally by me on the following activities: development of treatment plan with patient and/or surrogate as well as nursing, discussions with consultants, evaluation of patient's response to treatment, examination of patient, obtaining history from patient or surrogate, ordering and performing treatments and interventions, ordering and review of laboratory studies, ordering and review of radiographic studies, pulse oximetry and re-evaluation of patient's condition. Patient with respiratory failure requiring BiPAP, nitroglycerin and Lasix for pulmonary edema.  Patient will need to be admitted Medications Ordered in ED Medications  nitroGLYCERIN 50 mg in dextrose 5 % 250 mL (0.2 mg/mL) infusion (50 mcg/min Intravenous New Bag/Given 02/08/18 0450)  furosemide (LASIX) injection 40 mg (40 mg Intravenous Given 02/08/18 0522)     Initial Impression / Assessment and Plan / ED Course  I have reviewed the triage vital signs and the nursing notes.  Pertinent labs & imaging results that were available during my care of the patient were reviewed by me and considered in my medical decision making (see chart for details).     5:02 AM Seen on arrival for respiratory distress.  She was transitioned to BiPAP, and nitroglycerin drip ordered.  Chest x-ray confirms pulmonary edema.  IV Lasix has been ordered.  Patient's work of breathing is improving.  We will continue to monitor 5:51 AM Overall patient is improving, awaiting labs and then will need to be admitted 6:44 AM Discussed with Dr. Clyde Lundborg for admission.  Patient will need stepdown admission at this time.  Final Clinical Impressions(s) / ED Diagnoses   Final diagnoses:  Acute pulmonary edema (HCC)  Acute respiratory failure, unspecified whether with hypoxia or hypercapnia (HCC)  Acute systolic congestive heart failure [facility])    ED Discharge Orders    None       Zadie Rhine, MD 02/08/18 [phone]

## 2018-02-08 NOTE — ED Triage Notes (Signed)
Acute onset of shortness of breath one hour prior to EMS arrival.  Patient placed on CPAP en route to ED and given 2gm Magnesium, 125mg  solumedrol and duoneb en route to ED.  Patient dyspneic, tachycardic and anxious upon arrival to ED.

## 2018-02-08 NOTE — ED Notes (Signed)
Patient complaining of headache at this time.  New orders per Dr Bebe Shaggy.

## 2018-02-08 NOTE — Progress Notes (Signed)
Placed patient on bipap due to increased respiratory distress.  

## 2018-02-08 NOTE — H&P (Signed)
History and Physical    Carol Finley ZOX:[id number] DOB: Nov 09, 1950 DOA: 02/08/2018  PCP: Hoy Register, MD Consultants:  None Patient coming from:  Home - lives with children and grandchildren; NOK: Daughter, [phone]  Chief Complaint: SOB  HPI: Carol Finley is a 67 y.o. female with medical history significant of dCHF, hypertension, hyperlipidemia, diabetes mellitus, who presents with respiratory distress, needs BiPAP.  This AM, she woke up about 3AM with SOB.  She also had a "whistling type of noise when she would breathe."  Lying down made it worse.  She was placed on BIPAP with EMS and in the ER but she is now off BIPAP and appears more comfortable.  No chest pain.  She felt fine yesterday.  No edema.  Her weight is up about 8 pounds over the last month or two.  No cough.  She has had this problem before but it was more severe and lasted longer this time.  Echo in 8/16 with preserved EF and grade 1 diastolic dysfunction.   ED Course: Carryover, as per Dr. Clyde Lundborg: Found to have elevated BNP, pulmonary edema on chest x-ray, clinically consistent with CHF exacerbation.  Patient was given 1 dose of Lasix 40 mg, started nitroglycerin drip.  Troponin negative. Patient is admitted to stepdown as inpatient.  Review of Systems: As per HPI; otherwise review of systems reviewed and negative.   Ambulatory Status:  Ambulates without assistance  Past Medical History:  Diagnosis Date  . BACTERIAL PNEUMONIA 10/09/2009   Qualifier: Diagnosis of  By: Huntley Dec, Scott    . CHF (congestive heart failure) (HCC)    grade 1 diastolic, 2016  . Diabetes mellitus without complication (HCC)   . Hypertension     History reviewed. No pertinent surgical history.  Social History   Socioeconomic History  . Marital status: Widowed    Spouse name: Not on file  . Number of children: Not on file  . Years of education: Not on file  . Highest education level: Not on file    Occupational History  . Not on file  Social Needs  . Financial resource strain: Not on file  . Food insecurity:    Worry: Not on file    Inability: Not on file  . Transportation needs:    Medical: Not on file    Non-medical: Not on file  Tobacco Use  . Smoking status: Never Smoker  . Smokeless tobacco: Never Used  Substance and Sexual Activity  . Alcohol use: No  . Drug use: No  . Sexual activity: Not Currently  Lifestyle  . Physical activity:    Days per week: Not on file    Minutes per session: Not on file  . Stress: Not on file  Relationships  . Social connections:    Talks on phone: Not on file    Gets together: Not on file    Attends religious service: Not on file    Active member of club or organization: Not on file    Attends meetings of clubs or organizations: Not on file    Relationship status: Not on file  . Intimate partner violence:    Fear of current or ex partner: Not on file    Emotionally abused: Not on file    Physically abused: Not on file    Forced sexual activity: Not on file  Other Topics Concern  . Not on file  Social History Narrative  . Not on file    No Known Allergies  Family History  Problem Relation Age of Onset  . Diabetes Mother   . Hypertension Mother   . Heart disease Mother   . Hypertension Father     Prior to Admission medications   Medication Sig Start Date End Date Taking? Authorizing Provider  aspirin 81 MG tablet Take 1 tablet (81 mg total) by mouth daily. 11/19/14   Casey Burkitt, MD  atorvastatin (LIPITOR) 80 MG tablet Take 1 tablet (80 mg total) by mouth daily. 08/10/17   Hoy Register, MD  Blood Glucose Monitoring Suppl (TRUE METRIX METER) DEVI 1 each by Does not apply route 3 (three) times daily. 07/18/17   Hoy Register, MD  cetirizine (ZYRTEC) 10 MG tablet Take 1 tablet (10 mg total) by mouth daily. 11/02/17   Hoy Register, MD  cloNIDine (CATAPRES) 0.3 MG tablet Take 1 tablet (0.3 mg total) by mouth 3  (three) times daily. 08/10/17   Hoy Register, MD  ferrous sulfate 325 (65 FE) MG tablet Take 1 tablet (325 mg total) by mouth daily with breakfast. 01/31/17   Anders Simmonds, PA-C  furosemide (LASIX) 20 MG tablet Take 1 tablet (20 mg total) by mouth daily. 08/10/17   Hoy Register, MD  gabapentin (NEURONTIN) 300 MG capsule Take 2 capsules (600 mg total) by mouth 3 (three) times daily. 08/10/17   Hoy Register, MD  glucose blood (TRUE METRIX BLOOD GLUCOSE TEST) test strip 1 each by Other route 3 (three) times daily. 07/18/17   Hoy Register, MD  glucose blood test strip Use as instructed 07/18/17   Hoy Register, MD  insulin NPH-regular Human (NOVOLIN 70/30) (70-30) 100 UNIT/ML injection Inject 20 Units into the skin 2 (two) times daily with a meal. 08/10/17   Hoy Register, MD  Insulin Syringe-Needle U-100 (BD INSULIN SYRINGE ULTRAFINE) 31G X 15/64" 0.5 ML MISC Use as directed 05/18/17   Hoy Register, MD  isosorbide mononitrate (IMDUR) 30 MG 24 hr tablet Take 1 tablet (30 mg total) by mouth daily. 08/10/17   Hoy Register, MD  lisinopril (PRINIVIL,ZESTRIL) 40 MG tablet Take 1 tablet (40 mg total) by mouth daily. 08/10/17   Hoy Register, MD  meloxicam (MOBIC) 7.5 MG tablet Take 1 tablet (7.5 mg total) by mouth daily. 11/15/17   Hoy Register, MD  metFORMIN (GLUCOPHAGE) 1000 MG tablet Take 1 tablet (1,000 mg total) by mouth 2 (two) times daily with a meal. 08/10/17   Hoy Register, MD  TRUEPLUS LANCETS 28G MISC USE AS DIRECTED 07/18/17   Hoy Register, MD  VENTOLIN HFA 108 (90 Base) MCG/ACT inhaler INHALE 2 PUFFS INTO THE LUNGS EVERY SIX HOURS AS NEEDED FOR WHEEZING OR SHORTNESS OF BREATH. 01/12/18   Hoy Register, MD    Physical Exam: Vitals:   02/08/18 1105 02/08/18 1145 02/08/18 1227 02/08/18 1740  BP: (!) 160/71 (!) 168/70 (!) 195/87 (!) 161/67  Pulse:  81 75 76  Resp:  14 18   Temp:   98.7 F (37.1 C)   TempSrc:   Oral   SpO2:  98% 100% 98%  Weight:   75.2 kg   Height:   5'  3" (1.6 m)      General:  Appears calm and comfortable and is NAD; on [place] O2 at the time of my evaluation Eyes:  PERRL, EOMI, normal lids, iris ENT:  grossly normal hearing, lips & tongue, mmm Neck:  no LAD, masses or thyromegaly; no carotid bruits or JVD Cardiovascular:  RRR, no m/r/g. No LE edema.  Respiratory:   CTA  bilaterally with no wheezes/rales/rhonchi.  Normal respiratory effort. Abdomen:  soft, NT, ND, NABS Back:   normal alignment, no CVAT Skin:  no rash or induration seen on limited exam Musculoskeletal:  grossly normal tone BUE/BLE, good ROM, no bony abnormality Lower extremity:  No LE edema.  Limited foot exam with no ulcerations.  2+ distal pulses. Psychiatric:  grossly normal mood and affect, speech fluent and appropriate, AOx3 Neurologic:  CN 2-12 grossly intact, moves all extremities in coordinated fashion, sensation intact    Radiological Exams on Admission: Dg Chest Port 1 View  Result Date: 02/08/2018 CLINICAL DATA:  Acute onset shortness of breath 1 hour ago. EXAM: PORTABLE CHEST 1 VIEW COMPARISON:  11/11/2014 FINDINGS: Cardiac enlargement with pulmonary vascular congestion. Interstitial edema. No consolidation. No blunting of costophrenic angles. No pneumothorax. Mediastinal contours appear intact. Calcification of the aorta. Degenerative changes in the spine and shoulders. IMPRESSION: Cardiac enlargement with pulmonary vascular congestion and interstitial edema. Electronically Signed   By: Burman Nieves M.D.   On: 02/08/2018 04:50    EKG: Independently reviewed.  Sinus tachycardia with rate 121; LBBB; nonspecific ST changes with artifact  Labs on Admission: I have personally reviewed the available labs and imaging studies at the time of the admission.  Pertinent labs:   Glucose 205 BMP otherwise WNL BNP 743.8 Troponin <0.03 Normal CBC   Assessment/Plan Principal Problem:   Acute on chronic diastolic (congestive) heart failure (HCC) Active  Problems:   Diabetes type 2, uncontrolled (HCC)   Essential hypertension, benign   Hyperlipidemia   Acute respiratory failure resulting from acute on chronic CHF -Patient without prior h/o respiratory failure presenting with worsening SOB and hypoxia  -She was placed on BIPAP and given Lasix and NTG drip with improvement in her symptoms -She has not had LE edema but has had weight gain and had acute onset of symptoms overnight with respiratory failure - suspicious for flash pulmonary edema -CXR with pulmonary vascular congestion and edema -Elevated BNP -With elevated BNP and abnl CXR, CHF seems most probable as diagnosis -Will admit with telemetry now that she is no longer requiring BIPAP -NTG drip was able to be discontinued -Will request echocardiogram  -Will continue ASA -Will continue ACE  -Add Coreg -CHF order set utilized -Will consult cardiology -Was given Lasix 40 mg x 1 in ER and will repeat with 40 mg IV BID -Continue [place] O2 for now, since she is no longer requiring BIPAP -Normal kidney function at this time, will follow -Repeat EKG in AM anti-coagulation.  Will start 81 mg ASA for now and await repeat EKG, defer to day team in AM.  DM -Will check A1c - it was 8.7 6 months ago -hold Glucophage -She takes 70/30 insulin at home - will continue -Cover with moderate-scale SSI  HTN -Continue home medications - Clonidine, Lisinopril -Start Coreg  HLD -Continue Lipitor -Last lipid panel was in 5/19 (TC 165; TG 85; HDL 43; LDL 105) so will not recheck at this time    DVT prophylaxis:  Lovenox  Code Status:  Full - confirmed with patient/family Family Communication: Granddaughter present throughout evaluation (and served as Nurse, learning disability)  Disposition Plan:  Home once clinically improved Consults called: Cardiology; CM  Admission status: Admit - It is my clinical opinion that admission to INPATIENT is reasonable and necessary because of the expectation that this patient  will require hospital care that crosses at least 2 midnights to treat this condition based on the medical complexity of the problems presented.  Given the aforementioned information, the predictability of an adverse outcome is felt to be significant.    Jonah Blue MD [facility]  If note is complete, please contact covering daytime or nighttime physician. [email or web address] Password [facility]  02/08/2018, 5:48 PM

## 2018-02-08 NOTE — Progress Notes (Signed)
Patient appears to be breathing a lot easier at this time. Removed patient for bipap and laced patient on 3lm nasal cannula. Sp02 at this time is 97%. Breath sounds course crackles in lower lobes. Will continue to monitor patient.

## 2018-02-08 NOTE — Care Management (Signed)
This is a no charge note  Pending admission per Dr. Bebe Shaggy  67 year old lady with past medical history of dCHF, hypertension, hyperlipidemia, diabetes mellitus, who presents with respiratory distress, needs BiPAP.  Found to have elevated BNP, pulmonary edema on chest x-ray, clinically consistent with CHF exacerbation.  Patient was given 1 dose of Lasix 40 mg, started nitroglycerin drip.  Troponin negative. Patient is admitted to stepdown as inpatient.   Lorretta Harp, MD  [facility] Pager [phone]  If 7PM-7AM, please contact night-coverage [email or web address] Password [email or web address] 02/08/2018, 6:44 AM

## 2018-02-09 DIAGNOSIS — J9601 Acute respiratory failure with hypoxia: Secondary | ICD-10-CM | POA: Diagnosis present

## 2018-02-09 DIAGNOSIS — I161 Hypertensive emergency: Secondary | ICD-10-CM | POA: Diagnosis not present

## 2018-02-09 DIAGNOSIS — I110 Hypertensive heart disease with heart failure: Secondary | ICD-10-CM | POA: Diagnosis not present

## 2018-02-09 DIAGNOSIS — I5043 Acute on chronic combined systolic (congestive) and diastolic (congestive) heart failure: Secondary | ICD-10-CM

## 2018-02-09 DIAGNOSIS — I5021 Acute systolic (congestive) heart failure: Secondary | ICD-10-CM | POA: Diagnosis present

## 2018-02-09 LAB — CBC WITH DIFFERENTIAL/PLATELET
ABS IMMATURE GRANULOCYTES: 0.03 10*3/uL (ref 0.00–0.07)
BASOS ABS: 0 10*3/uL (ref 0.0–0.1)
Basophils Relative: 0 %
Eosinophils Absolute: 0 10*3/uL (ref 0.0–0.5)
Eosinophils Relative: 0 %
HEMATOCRIT: 36.2 % (ref 36.0–46.0)
Hemoglobin: 11.7 g/dL — ABNORMAL LOW (ref 12.0–15.0)
Immature Granulocytes: 0 %
LYMPHS ABS: 1.7 10*3/uL (ref 0.7–4.0)
LYMPHS PCT: 19 %
MCH: 28.3 pg (ref 26.0–34.0)
MCHC: 32.3 g/dL (ref 30.0–36.0)
MCV: 87.4 fL (ref 80.0–100.0)
Monocytes Absolute: 1.1 10*3/uL — ABNORMAL HIGH (ref 0.1–1.0)
Monocytes Relative: 12 %
NEUTROS ABS: 6.2 10*3/uL (ref 1.7–7.7)
NRBC: 0 % (ref 0.0–0.2)
Neutrophils Relative %: 69 %
Platelets: 209 10*3/uL (ref 150–400)
RBC: 4.14 MIL/uL (ref 3.87–5.11)
RDW: 14.7 % (ref 11.5–15.5)
WBC: 9.1 10*3/uL (ref 4.0–10.5)

## 2018-02-09 LAB — GLUCOSE, CAPILLARY
Glucose-Capillary: 154 mg/dL — ABNORMAL HIGH (ref 70–99)
Glucose-Capillary: 236 mg/dL — ABNORMAL HIGH (ref 70–99)
Glucose-Capillary: 302 mg/dL — ABNORMAL HIGH (ref 70–99)
Glucose-Capillary: 120 mg/dL — ABNORMAL HIGH (ref 70–99)

## 2018-02-09 LAB — HEMOGLOBIN A1C
Hgb A1c MFr Bld: 7.7 % — ABNORMAL HIGH (ref 4.8–5.6)
Mean Plasma Glucose: 174.29 mg/dL

## 2018-02-09 LAB — BASIC METABOLIC PANEL
Anion gap: 10 (ref 5–15)
BUN: 20 mg/dL (ref 8–23)
CO2: 27 mmol/L (ref 22–32)
Calcium: 8.7 mg/dL — ABNORMAL LOW (ref 8.9–10.3)
Chloride: 101 mmol/L (ref 98–111)
Creatinine, Ser: 0.87 mg/dL (ref 0.44–1.00)
GFR calc Af Amer: >60 mL/min (ref >60)
GLUCOSE: 153 mg/dL — AB (ref 70–99)
POTASSIUM: 3.6 mmol/L (ref 3.5–5.1)
Sodium: 138 mmol/L (ref 135–145)

## 2018-02-09 LAB — HIV ANTIBODY (ROUTINE TESTING W REFLEX): HIV SCREEN 4TH GENERATION: NONREACTIVE

## 2018-02-09 MED ORDER — SODIUM CHLORIDE 0.9% FLUSH: 3.0000 mL | INTRAVENOUS | Status: DC | PRN

## 2018-02-09 MED ORDER — SODIUM CHLORIDE 0.9 % IV SOLN
INTRAVENOUS | Status: DC
  Administered 2018-02-10: 05:00:00 via INTRAVENOUS

## 2018-02-09 MED ORDER — SODIUM CHLORIDE 0.9% FLUSH: 3.0000 mL | Freq: Two times a day (BID) | INTRAVENOUS | Status: DC

## 2018-02-09 MED ORDER — ASPIRIN 81 MG PO CHEW
81.0000 mg | CHEWABLE_TABLET | ORAL | Status: AC
  Administered 2018-02-10: 81 mg via ORAL
  Filled 2018-02-09: qty 1

## 2018-02-09 MED ORDER — SODIUM CHLORIDE 0.9 % IV SOLN: 250.0000 mL | INTRAVENOUS | Status: DC | PRN

## 2018-02-09 NOTE — Progress Notes (Signed)
Patient ID: Carol Finley, female   DOB: 25-Aug-1950, 67 y.o.   MRN: [id number]  PROGRESS NOTE    Carol Finley  WUJ:[id number] DOB: 27-Jul-1950 DOA: 02/08/2018 PCP: Hoy Register, MD   Brief Narrative:  67 year old female with history of diastolic CHF, hypertension, hyperlipidemia, diabetes mellitus presented with shortness of breath.  She was found to have pulmonary edema on chest x-ray and admitted for CHF exacerbation, started on intravenous Lasix and nitroglycerin.  Cardiology was consulted.  Patient initially required BiPAP.   Assessment & Plan:   Principal Problem:   Acute on chronic diastolic (congestive) heart failure (HCC) Active Problems:   Diabetes type 2, uncontrolled (HCC)   Essential hypertension, benign   Hyperlipidemia  Acute hypoxic respiratory failure probably secondary to CHF exacerbation -Initially required BiPAP.  Respiratory status has improved.  Still requiring oxygen via nasal cannula at 2 L/min.  Wean off as able.  Acute systolic heart failure -Echo shows EF of 30 to 35% with grade 1 diastolic dysfunction and akinesis of the inferior lateral and inferior myocardium.  Prior echo had EF of 60 to 65% in 2016 -Cardiology following.  Plan for cardiac catheterization on 02/10/2018 -Continue strict input and output.  Negative balance of 2808.8 cc since admission.  Daily weights -Continue Lasix IV, Coreg, lisinopril and isosorbide mononitrate  Diabetes mellitus type 2 -Hemoglobin A1c 7.7 -Oral medications on hold -Continue Accu-Cheks with insulin sliding scale coverage along with 70/30 insulin  Hypertension -Blood pressure stable.  Continue lisinopril, clonidine, Coreg, Lasix  Hyperlipidemia -Continue Lipitor    DVT prophylaxis: Lovenox Code Status: Full Family Communication: Patient and family member at bedside Disposition Plan: Depends on clinical outcome  Consultants: Cardiology  Procedures:  Echo on 02/08/2018 Study  Conclusions  - Left ventricle: The cavity size was at the upper limits of   normal. Wall thickness was increased in a pattern of moderate   LVH. Systolic function was moderately to severely reduced. The   estimated ejection fraction was in the range of 30% to 35%. There   is akinesis of the inferolateral and inferior myocardium. Doppler   parameters are consistent with abnormal left ventricular   relaxation (grade 1 diastolic dysfunction). Doppler parameters   are consistent with high ventricular filling pressure. - Ventricular septum: Septal motion showed abnormal function and   dyssynergy. These changes are consistent with a left bundle   branch block. - Mitral valve: There was mild regurgitation. - Left atrium: The atrium was moderately dilated. - Pulmonary arteries: PA peak pressure: 41 mm Hg (S).   Antimicrobials: None   Subjective: Patient seen and examined at bedside.  She denies any worsening chest pain or shortness of breath.  No current nausea or vomiting.  Objective: Vitals:   02/08/18 1950 02/09/18 0024 02/09/18 0427 02/09/18 0741  BP: 134/63 132/66 132/63 136/69  Pulse: 68 62 (!) 58 (!) 57  Resp: 20 18 18 16   Temp: 98.3 F (36.8 C) 98.4 F (36.9 C) 98.6 F (37 C) 98.1 F (36.7 C)  TempSrc: Oral Oral Oral Oral  SpO2: 95% 93% 96% 98%  Weight:   75.3 kg   Height:        Intake/Output Summary (Last 24 hours) at 02/09/2018 0953 Last data filed at 02/09/2018 0600 Gross per 24 hour  Intake 441.17 ml  Output 1350 ml  Net -908.83 ml   Filed Weights   02/08/18 1034 02/08/18 1227 02/09/18 0427  Weight: 75.2 kg 75.2 kg 75.3 kg    Examination:  General exam:  Appears calm and comfortable  Respiratory system: Bilateral decreased breath sounds at bases, basilar crackles Cardiovascular system: S1 & S2 heard, mild bradycardia Gastrointestinal system: Abdomen is nondistended, soft and nontender. Normal bowel sounds heard. Extremities: No cyanosis; Trace  edema  Data Reviewed: I have personally reviewed following labs and imaging studies  CBC: Recent Labs  Lab 02/08/18 0446 02/09/18 0418  WBC 10.4 9.1  NEUTROABS 5.4 6.2  HGB 13.8 11.7*  HCT 43.8 36.2  MCV 89.8 87.4  PLT 242 209   Basic Metabolic Panel: Recent Labs  Lab 02/08/18 0446 02/09/18 0418  NA 136 138  K 4.5 3.6  CL 101 101  CO2 26 27  GLUCOSE 205* 153*  BUN 11 20  CREATININE 0.66 0.87  CALCIUM 8.9 8.7*   GFR: Estimated Creatinine Clearance: 61 mL/min (by C-G formula based on SCr of 0.87 mg/dL). Liver Function Tests: Recent Labs  Lab 02/08/18 0446  AST 56*  ALT 26  ALKPHOS 97  BILITOT 1.6*  PROT 6.7  ALBUMIN 3.6   No results for input(s): LIPASE, AMYLASE in the last 168 hours. No results for input(s): AMMONIA in the last 168 hours. Coagulation Profile: No results for input(s): INR, PROTIME in the last 168 hours. Cardiac Enzymes: Recent Labs  Lab 02/08/18 0446  TROPONINI <0.03   BNP (last 3 results) No results for input(s): PROBNP in the last 8760 hours. HbA1C: Recent Labs    02/09/18 0418  HGBA1C 7.7*   CBG: Recent Labs  Lab 02/08/18 1222 02/08/18 1703 02/08/18 2155 02/09/18 0735  GLUCAP 541* 403* 214* 154*   Lipid Profile: No results for input(s): CHOL, HDL, LDLCALC, TRIG, CHOLHDL, LDLDIRECT in the last 72 hours. Thyroid Function Tests: No results for input(s): TSH, T4TOTAL, FREET4, T3FREE, THYROIDAB in the last 72 hours. Anemia Panel: No results for input(s): VITAMINB12, FOLATE, FERRITIN, TIBC, IRON, RETICCTPCT in the last 72 hours. Sepsis Labs: No results for input(s): PROCALCITON, LATICACIDVEN in the last 168 hours.  No results found for this or any previous visit (from the past 240 hour(s)).       Radiology Studies: Dg Chest Port 1 View  Result Date: 02/08/2018 CLINICAL DATA:  Acute onset shortness of breath 1 hour ago. EXAM: PORTABLE CHEST 1 VIEW COMPARISON:  11/11/2014 FINDINGS: Cardiac enlargement with pulmonary  vascular congestion. Interstitial edema. No consolidation. No blunting of costophrenic angles. No pneumothorax. Mediastinal contours appear intact. Calcification of the aorta. Degenerative changes in the spine and shoulders. IMPRESSION: Cardiac enlargement with pulmonary vascular congestion and interstitial edema. Electronically Signed   By: Burman Nieves M.D.   On: 02/08/2018 04:50        Scheduled Meds: . aspirin EC  81 mg Oral Daily  . atorvastatin  80 mg Oral Daily  . carvedilol  6.25 mg Oral BID WC  . cloNIDine  0.3 mg Oral TID  . enoxaparin (LOVENOX) injection  40 mg Subcutaneous Q24H  . ferrous sulfate  325 mg Oral Q breakfast  . furosemide  40 mg Intravenous Q12H  . gabapentin  600 mg Oral TID  . insulin aspart  0-15 Units Subcutaneous TID WC  . insulin aspart  0-5 Units Subcutaneous QHS  . insulin aspart protamine- aspart  20 Units Subcutaneous BID WC  . isosorbide mononitrate  30 mg Oral Daily  . lisinopril  40 mg Oral Daily  . loratadine  10 mg Oral Daily  . sodium chloride flush  3 mL Intravenous Q12H   Continuous Infusions: . sodium chloride  LOS: 1 day        Glade Lloyd, MD [facility] Pager [phone]  If 7PM-7AM, please contact night-coverage [email or web address] Password [email or web address] 02/09/2018, 9:53 AM

## 2018-02-09 NOTE — Plan of Care (Signed)
  Problem: Clinical Measurements: Goal: Diagnostic test results will improve Outcome: Progressing   Problem: Clinical Measurements: Goal: Respiratory complications will improve Outcome: Progressing   Problem: Cardiac: Goal: Ability to achieve and maintain adequate cardiopulmonary perfusion will improve Outcome: Progressing

## 2018-02-09 NOTE — Plan of Care (Signed)
  Problem: Clinical Measurements: Goal: Ability to maintain clinical measurements within normal limits will improve Outcome: Progressing   Problem: Clinical Measurements: Goal: Diagnostic test results will improve Outcome: Progressing   Problem: Clinical Measurements: Goal: Cardiovascular complication will be avoided Outcome: Progressing   

## 2018-02-09 NOTE — Progress Notes (Signed)
Progress Note  Patient Name: Carol Finley Date of Encounter: 02/09/2018  Primary Cardiologist: Armanda Magic   Subjective   Breathing much better Long discussion via grand daughter as interpreter regarding need for Cath in am   Inpatient Medications    Scheduled Meds: . aspirin EC  81 mg Oral Daily  . atorvastatin  80 mg Oral Daily  . carvedilol  6.25 mg Oral BID WC  . cloNIDine  0.3 mg Oral TID  . enoxaparin (LOVENOX) injection  40 mg Subcutaneous Q24H  . ferrous sulfate  325 mg Oral Q breakfast  . furosemide  40 mg Intravenous Q12H  . gabapentin  600 mg Oral TID  . insulin aspart  0-15 Units Subcutaneous TID WC  . insulin aspart  0-5 Units Subcutaneous QHS  . insulin aspart protamine- aspart  20 Units Subcutaneous BID WC  . isosorbide mononitrate  30 mg Oral Daily  . lisinopril  40 mg Oral Daily  . loratadine  10 mg Oral Daily  . sodium chloride flush  3 mL Intravenous Q12H   Continuous Infusions: . sodium chloride     PRN Meds: sodium chloride, acetaminophen, albuterol, ondansetron (ZOFRAN) IV, sodium chloride flush   Vital Signs    Vitals:   02/08/18 1950 02/09/18 0024 02/09/18 0427 02/09/18 0741  BP: 134/63 132/66 132/63 136/69  Pulse: 68 62 (!) 58 (!) 57  Resp: 20 18 18 16   Temp: 98.3 F (36.8 C) 98.4 F (36.9 C) 98.6 F (37 C) 98.1 F (36.7 C)  TempSrc: Oral Oral Oral Oral  SpO2: 95% 93% 96% 98%  Weight:   75.3 kg   Height:        Intake/Output Summary (Last 24 hours) at 02/09/2018 0826 Last data filed at 02/09/2018 0600 Gross per 24 hour  Intake 441.17 ml  Output 2550 ml  Net -2108.83 ml   Filed Weights   02/08/18 1034 02/08/18 1227 02/09/18 0427  Weight: 75.2 kg 75.2 kg 75.3 kg    Telemetry    NSR 02/09/2018  - Personally Reviewed  ECG    NSR LVH with strain  - Personally Reviewed  Physical Exam  Elderly [place] female  GEN: No acute distress.   Neck: No JVD Cardiac: RRR, no murmurs, rubs, or gallops.    Respiratory: basilar crackles  GI: Soft, nontender, non-distended  MS: No edema; No deformity. Neuro:  Nonfocal  Psych: Normal affect   Labs    Chemistry Recent Labs  Lab 02/08/18 0446 02/09/18 0418  NA 136 138  K 4.5 3.6  CL 101 101  CO2 26 27  GLUCOSE 205* 153*  BUN 11 20  CREATININE 0.66 0.87  CALCIUM 8.9 8.7*  PROT 6.7  --   ALBUMIN 3.6  --   AST 56*  --   ALT 26  --   ALKPHOS 97  --   BILITOT 1.6*  --   GFRNONAA >60 >60  GFRAA >60 >60  ANIONGAP 9 10     Hematology Recent Labs  Lab 02/08/18 0446 02/09/18 0418  WBC 10.4 9.1  RBC 4.88 4.14  HGB 13.8 11.7*  HCT 43.8 36.2  MCV 89.8 87.4  MCH 28.3 28.3  MCHC 31.5 32.3  RDW 14.6 14.7  PLT 242 209    Cardiac Enzymes Recent Labs  Lab 02/08/18 0446  TROPONINI <0.03   No results for input(s): TROPIPOC in the last 168 hours.   BNP Recent Labs  Lab 02/08/18 0446  BNP 743.8*     DDimer  No results for input(s): DDIMER in the last 168 hours.   Radiology    Dg Chest Port 1 View  Result Date: 02/08/2018 CLINICAL DATA:  Acute onset shortness of breath 1 hour ago. EXAM: PORTABLE CHEST 1 VIEW COMPARISON:  11/11/2014 FINDINGS: Cardiac enlargement with pulmonary vascular congestion. Interstitial edema. No consolidation. No blunting of costophrenic angles. No pneumothorax. Mediastinal contours appear intact. Calcification of the aorta. Degenerative changes in the spine and shoulders. IMPRESSION: Cardiac enlargement with pulmonary vascular congestion and interstitial edema. Electronically Signed   By: Burman Nieves M.D.   On: 02/08/2018 04:50    Cardiac Studies   TTE EF 30-35% RWMA inferolateral and inferior   Patient Profile     67 y.o. female admitted with flash pulmonary edema and hypertensive urgency. No chest pain Negative troponin but TTE abnormal with EF 30-35% and RWMA;s Responded well to bipap Diuresis and Rx BP  Assessment & Plan    DCM:  Multiple coronary risk factors ECG LVH with strain  Negative troponin May be dealing with Takatsubo Type etiology but has RWMA in inferior and inferolateral walls by TTE. Continue diuresis and RX for BP Discussed right and left cath with patient and grand daughter Risks including stroke, bleeding , intubation  Contrast reaction and need for emergent surgery discussed willing to proceed. Orders done have scheduled For Dr [place] 2 nd case tomorrow have asked for spanish interpreter to be present  HTN:  Much improved continue current Rx  DM:  Discussed low carb diet.  Target hemoglobin A1c is 6.5 or less.  Continue current medications.       For questions or updates, please contact [facility] Please consult [email or web address] for contact info under        Signed, Charlton Haws, MD  02/09/2018, 8:26 AM

## 2018-02-10 ENCOUNTER — Encounter (HOSPITAL_COMMUNITY)
Admission: EM | Disposition: A | Discharge status: Home / Self Care | Payer: Self-pay | Source: Home / Self Care | Attending: Internal Medicine

## 2018-02-10 ENCOUNTER — Encounter (HOSPITAL_COMMUNITY): Payer: Self-pay | Admitting: Cardiology

## 2018-02-10 DIAGNOSIS — E11641 Type 2 diabetes mellitus with hypoglycemia with coma: Secondary | ICD-10-CM

## 2018-02-10 DIAGNOSIS — E782 Mixed hyperlipidemia: Secondary | ICD-10-CM

## 2018-02-10 HISTORY — PX: RIGHT/LEFT HEART CATH AND CORONARY ANGIOGRAPHY: CATH118266

## 2018-02-10 LAB — POCT I-STAT 3, ART BLOOD GAS (G3+)
ACID-BASE EXCESS: 8 mmol/L — AB (ref 0.0–2.0)
Bicarbonate: 33.2 mmol/L — ABNORMAL HIGH (ref 20.0–28.0)
O2 SAT: 96 %
TCO2: 35 mmol/L — ABNORMAL HIGH (ref 22–32)
pCO2 arterial: 46.9 mmHg (ref 32.0–48.0)
pH, Arterial: 7.458 — ABNORMAL HIGH (ref 7.350–7.450)
pO2, Arterial: 78 mmHg — ABNORMAL LOW (ref 83.0–108.0)

## 2018-02-10 LAB — POCT I-STAT 3, VENOUS BLOOD GAS (G3P V)
ACID-BASE EXCESS: 6 mmol/L — AB (ref 0.0–2.0)
Bicarbonate: 32.8 mmol/L — ABNORMAL HIGH (ref 20.0–28.0)
O2 Saturation: 64 %
TCO2: 34 mmol/L — ABNORMAL HIGH (ref 22–32)
pCO2, Ven: 55.2 mmHg (ref 44.0–60.0)
pH, Ven: 7.381 (ref 7.250–7.430)
pO2, Ven: 35 mmHg (ref 32.0–45.0)

## 2018-02-10 LAB — BASIC METABOLIC PANEL
ANION GAP: 8 (ref 5–15)
BUN: 20 mg/dL (ref 8–23)
CALCIUM: 8.6 mg/dL — AB (ref 8.9–10.3)
CHLORIDE: 101 mmol/L (ref 98–111)
CO2: 30 mmol/L (ref 22–32)
CREATININE: 0.75 mg/dL (ref 0.44–1.00)
GFR calc non Af Amer: >60 mL/min (ref >60)
Glucose, Bld: 98 mg/dL (ref 70–99)
Potassium: 3.3 mmol/L — ABNORMAL LOW (ref 3.5–5.1)
Sodium: 139 mmol/L (ref 135–145)

## 2018-02-10 LAB — GLUCOSE, CAPILLARY
GLUCOSE-CAPILLARY: 278 mg/dL — AB (ref 70–99)
Glucose-Capillary: 134 mg/dL — ABNORMAL HIGH (ref 70–99)
Glucose-Capillary: 144 mg/dL — ABNORMAL HIGH (ref 70–99)
Glucose-Capillary: 147 mg/dL — ABNORMAL HIGH (ref 70–99)

## 2018-02-10 LAB — MAGNESIUM: Magnesium: 1.9 mg/dL (ref 1.7–2.4)

## 2018-02-10 SURGERY — RIGHT/LEFT HEART CATH AND CORONARY ANGIOGRAPHY
Anesthesia: LOCAL

## 2018-02-10 MED ORDER — ENOXAPARIN SODIUM 40 MG/0.4ML ~~LOC~~ SOLN
40.0000 mg | SUBCUTANEOUS | Status: DC
  Administered 2018-02-11: 40 mg via SUBCUTANEOUS
  Filled 2018-02-10 (×2): qty 0.4

## 2018-02-10 MED ORDER — SODIUM CHLORIDE 0.9 % WEIGHT BASED INFUSION
1.0000 mL/kg/h | INTRAVENOUS | Status: AC
  Administered 2018-02-10: 1 mL/kg/h via INTRAVENOUS

## 2018-02-10 MED ORDER — MIDAZOLAM HCL 2 MG/2ML IJ SOLN
INTRAMUSCULAR | Status: AC
  Filled 2018-02-10: qty 2

## 2018-02-10 MED ORDER — LIDOCAINE HCL (PF) 1 % IJ SOLN
INTRAMUSCULAR | Status: AC
  Filled 2018-02-10: qty 30

## 2018-02-10 MED ORDER — FENTANYL CITRATE (PF) 100 MCG/2ML IJ SOLN
INTRAMUSCULAR | Status: DC | PRN
  Administered 2018-02-10: 25 ug via INTRAVENOUS

## 2018-02-10 MED ORDER — VERAPAMIL HCL 2.5 MG/ML IV SOLN
INTRAVENOUS | Status: AC
  Filled 2018-02-10: qty 2

## 2018-02-10 MED ORDER — FENTANYL CITRATE (PF) 100 MCG/2ML IJ SOLN
INTRAMUSCULAR | Status: AC
  Filled 2018-02-10: qty 2

## 2018-02-10 MED ORDER — POTASSIUM CHLORIDE CRYS ER 20 MEQ PO TBCR: 40.0000 meq | EXTENDED_RELEASE_TABLET | Freq: Once | ORAL | Status: DC

## 2018-02-10 MED ORDER — SODIUM CHLORIDE 0.9 % IV SOLN: 250.0000 mL | INTRAVENOUS | Status: DC | PRN

## 2018-02-10 MED ORDER — HEPARIN (PORCINE) IN NACL 1000-0.9 UT/500ML-% IV SOLN
INTRAVENOUS | Status: DC | PRN
  Administered 2018-02-10 (×2): 500 mL

## 2018-02-10 MED ORDER — HEPARIN (PORCINE) IN NACL 1000-0.9 UT/500ML-% IV SOLN
INTRAVENOUS | Status: AC
  Filled 2018-02-10: qty 1000

## 2018-02-10 MED ORDER — SODIUM CHLORIDE 0.9% FLUSH
3.0000 mL | Freq: Two times a day (BID) | INTRAVENOUS | Status: DC
  Administered 2018-02-11: 3 mL via INTRAVENOUS

## 2018-02-10 MED ORDER — MIDAZOLAM HCL 2 MG/2ML IJ SOLN
INTRAMUSCULAR | Status: DC | PRN
  Administered 2018-02-10: 1 mg via INTRAVENOUS

## 2018-02-10 MED ORDER — HEPARIN SODIUM (PORCINE) 1000 UNIT/ML IJ SOLN
INTRAMUSCULAR | Status: DC | PRN
  Administered 2018-02-10: 4000 [IU] via INTRAVENOUS

## 2018-02-10 MED ORDER — LIDOCAINE HCL (PF) 1 % IJ SOLN
INTRAMUSCULAR | Status: DC | PRN
  Administered 2018-02-10: 5 mL

## 2018-02-10 MED ORDER — SODIUM CHLORIDE 0.9% FLUSH: 3.0000 mL | INTRAVENOUS | Status: DC | PRN

## 2018-02-10 MED ORDER — VERAPAMIL HCL 2.5 MG/ML IV SOLN
INTRAVENOUS | Status: DC | PRN
  Administered 2018-02-10: 10 mL via INTRA_ARTERIAL

## 2018-02-10 MED ORDER — IOHEXOL 350 MG/ML SOLN
INTRAVENOUS | Status: DC | PRN
  Administered 2018-02-10: 75 mL via INTRA_ARTERIAL

## 2018-02-10 SURGICAL SUPPLY — 12 items
CATH 5FR JL3.5 JR4 ANG PIG MP (CATHETERS) ×1 IMPLANT
CATH BALLN WEDGE 5F 110CM (CATHETERS) ×1 IMPLANT
DEVICE RAD COMP TR BAND LRG (VASCULAR PRODUCTS) ×1 IMPLANT
GLIDESHEATH SLEND SS 6F .021 (SHEATH) ×1 IMPLANT
GUIDEWIRE INQWIRE 1.5J.035X260 (WIRE) IMPLANT
INQWIRE 1.5J .035X260CM (WIRE) ×2
KIT HEART LEFT (KITS) ×2 IMPLANT
PACK CARDIAC CATHETERIZATION (CUSTOM PROCEDURE TRAY) ×2 IMPLANT
SHEATH GLIDE SLENDER 4/5FR (SHEATH) ×1 IMPLANT
SYR MEDRAD MARK V 150ML (SYRINGE) ×2 IMPLANT
TRANSDUCER W/STOPCOCK (MISCELLANEOUS) ×2 IMPLANT
TUBING CIL FLEX 10 FLL-RA (TUBING) ×2 IMPLANT

## 2018-02-10 NOTE — Care Management Note (Signed)
Case Management Note  Patient Details  Name: Carol Finley MRN: [id number] Date of Birth: November 06, 1950  Subjective/Objective:     Diabetes, Heart Catherizaztion 02/10/2018             Action/Plan: CM talked to granddaughter; Patient lives at home with her family; PCP: Hoy Register, MD; no medical insurance, in the [place] for 17 yrs; goes to the [facility] for medication/ primary care; no DME in the home; CM will continue to follow for progression of care. Expected Discharge Date:   possibly 02/11/2018               Expected Discharge Plan:  Home/Self Care  Discharge planning Services  CM Consult  Status of Service:  In process, will continue to follow  Reola Mosher [phone] 02/10/2018, 4:08 PM

## 2018-02-10 NOTE — Progress Notes (Signed)
Patient ID: Carol Finley, female   DOB: 1950/07/11, 67 y.o.   MRN: [id number]  PROGRESS NOTE    Carol Finley  PRX:[id number] DOB: Aug 10, 1950 DOA: 02/08/2018 PCP: Hoy Register, MD   Brief Narrative:  67 year old female with history of diastolic CHF, hypertension, hyperlipidemia, diabetes mellitus presented with shortness of breath.  She was found to have pulmonary edema on chest x-ray and admitted for CHF exacerbation, started on intravenous Lasix and nitroglycerin.  Cardiology was consulted.  Patient initially required BiPAP.   Assessment & Plan:   Active Problems:   Diabetes type 2, uncontrolled (HCC)   Essential hypertension, benign   Hyperlipidemia   Acute systolic CHF (congestive heart failure) (HCC)   Acute respiratory failure with hypoxia (HCC)  Acute hypoxic respiratory failure probably secondary to CHF exacerbation -Initially required BiPAP.  Respiratory status has improved.  Still requiring oxygen via nasal cannula at 2 L/min.  Wean off as able. -Incentive spirometry  Acute systolic heart failure -Echo shows EF of 30 to 35% with grade 1 diastolic dysfunction and akinesis of the inferior lateral and inferior myocardium.  Prior echo had EF of 60 to 65% in 2016 -Cardiology following.  Plan for cardiac catheterization today.  Will follow further recommendations. -Continue strict input and output.  Negative balance of 3288.8 cc since admission.  Daily weights -Continue Lasix IV, Coreg, lisinopril and isosorbide mononitrate  Diabetes mellitus type 2 -Hemoglobin A1c 7.7 -Oral medications on hold -Continue Accu-Cheks with insulin sliding scale coverage along with 70/30 insulin  Hypokalemia -Replace.  Repeat a.m. labs  Hypertension -Blood pressure stable.  Continue lisinopril, clonidine, Coreg, Lasix and isosorbide mononitrate  Hyperlipidemia -Continue Lipitor    DVT prophylaxis: Lovenox Code Status: Full Family Communication: Patient and  family member at bedside Disposition Plan: Depends on clinical outcome  Consultants: Cardiology  Procedures:  Echo on 02/08/2018 Study Conclusions  - Left ventricle: The cavity size was at the upper limits of   normal. Wall thickness was increased in a pattern of moderate   LVH. Systolic function was moderately to severely reduced. The   estimated ejection fraction was in the range of 30% to 35%. There   is akinesis of the inferolateral and inferior myocardium. Doppler   parameters are consistent with abnormal left ventricular   relaxation (grade 1 diastolic dysfunction). Doppler parameters   are consistent with high ventricular filling pressure. - Ventricular septum: Septal motion showed abnormal function and   dyssynergy. These changes are consistent with a left bundle   branch block. - Mitral valve: There was mild regurgitation. - Left atrium: The atrium was moderately dilated. - Pulmonary arteries: PA peak pressure: 41 mm Hg (S).   Antimicrobials: None   Subjective: Patient seen and examined at bedside with the help of Spanish interpreter at bedside.  No overnight fever, nausea, vomiting, worsening chest pain or shortness of breath  Objective: Vitals:   02/10/18 0026 02/10/18 0503 02/10/18 0516 02/10/18 0944  BP: (!) 151/74 (!) 148/73    Pulse: (!) 58 (!) 58    Resp: 18 18    Temp: 98.1 F (36.7 C)  98.4 F (36.9 C)   TempSrc: Oral  Oral   SpO2: 99% 96%  100%  Weight:  74.4 kg    Height:        Intake/Output Summary (Last 24 hours) at 02/10/2018 0952 Last data filed at 02/10/2018 0600 Gross per 24 hour  Intake 120 ml  Output 600 ml  Net -480 ml   [employer]  02/08/18 1227 02/09/18 0427 02/10/18 0503  Weight: 75.2 kg 75.3 kg 74.4 kg    Examination:  General exam: Appears calm and comfortable, no distress Respiratory system: Bilateral decreased breath sounds at bases, basilar crackles, no wheezing Cardiovascular system: S1 & S2 heard, still has mild  bradycardia Gastrointestinal system: Abdomen is nondistended, soft and nontender. Normal bowel sounds heard. Extremities: No cyanosis; Trace edema  Data Reviewed: I have personally reviewed following labs and imaging studies  CBC: Recent Labs  Lab 02/08/18 0446 02/09/18 0418  WBC 10.4 9.1  NEUTROABS 5.4 6.2  HGB 13.8 11.7*  HCT 43.8 36.2  MCV 89.8 87.4  PLT 242 209   Basic Metabolic Panel: Recent Labs  Lab 02/08/18 0446 02/09/18 0418 02/10/18 0323  NA 136 138 139  K 4.5 3.6 3.3*  CL 101 101 101  CO2 26 27 30   GLUCOSE 205* 153* 98  BUN 11 20 20   CREATININE 0.66 0.87 0.75  CALCIUM 8.9 8.7* 8.6*  MG  --   --  1.9   GFR: Estimated Creatinine Clearance: 65.9 mL/min (by C-G formula based on SCr of 0.75 mg/dL). Liver Function Tests: Recent Labs  Lab 02/08/18 0446  AST 56*  ALT 26  ALKPHOS 97  BILITOT 1.6*  PROT 6.7  ALBUMIN 3.6   No results for input(s): LIPASE, AMYLASE in the last 168 hours. No results for input(s): AMMONIA in the last 168 hours. Coagulation Profile: No results for input(s): INR, PROTIME in the last 168 hours. Cardiac Enzymes: Recent Labs  Lab 02/08/18 0446  TROPONINI <0.03   BNP (last 3 results) No results for input(s): PROBNP in the last 8760 hours. HbA1C: Recent Labs    02/09/18 0418  HGBA1C 7.7*   CBG: Recent Labs  Lab 02/09/18 0735 02/09/18 1154 02/09/18 1629 02/09/18 2142 02/10/18 0756  GLUCAP 154* 236* 302* 120* 134*   Lipid Profile: No results for input(s): CHOL, HDL, LDLCALC, TRIG, CHOLHDL, LDLDIRECT in the last 72 hours. Thyroid Function Tests: No results for input(s): TSH, T4TOTAL, FREET4, T3FREE, THYROIDAB in the last 72 hours. Anemia Panel: No results for input(s): VITAMINB12, FOLATE, FERRITIN, TIBC, IRON, RETICCTPCT in the last 72 hours. Sepsis Labs: No results for input(s): PROCALCITON, LATICACIDVEN in the last 168 hours.  No results found for this or any previous visit (from the past 240 hour(s)).        Radiology Studies: No results found.      Scheduled Meds: . [MAR Hold] aspirin EC  81 mg Oral Daily  . [MAR Hold] atorvastatin  80 mg Oral Daily  . [MAR Hold] carvedilol  6.25 mg Oral BID WC  . [MAR Hold] cloNIDine  0.3 mg Oral TID  . [MAR Hold] enoxaparin (LOVENOX) injection  40 mg Subcutaneous Q24H  . [MAR Hold] ferrous sulfate  325 mg Oral Q breakfast  . [MAR Hold] furosemide  40 mg Intravenous Q12H  . [MAR Hold] gabapentin  600 mg Oral TID  . [MAR Hold] insulin aspart  0-15 Units Subcutaneous TID WC  . [MAR Hold] insulin aspart  0-5 Units Subcutaneous QHS  . [MAR Hold] insulin aspart protamine- aspart  20 Units Subcutaneous BID WC  . [MAR Hold] isosorbide mononitrate  30 mg Oral Daily  . [MAR Hold] lisinopril  40 mg Oral Daily  . [MAR Hold] loratadine  10 mg Oral Daily  . potassium chloride  40 mEq Oral Once  . [MAR Hold] sodium chloride flush  3 mL Intravenous Q12H  . sodium chloride flush  3  mL Intravenous Q12H   Continuous Infusions: . [MAR Hold] sodium chloride    . sodium chloride    . sodium chloride 10 mL/hr at 02/10/18 0518     LOS: 2 days        Glade Lloyd, MD [facility] Pager [phone]  If 7PM-7AM, please contact night-coverage [email or web address] Password [email or web address] 02/10/2018, 9:52 AM

## 2018-02-10 NOTE — H&P (View-Only) (Signed)
 Progress Note  Patient Name: Carol Finley Date of Encounter: 02/10/2018  Primary Cardiologist: Traci Turner   Subjective   No dyspnea or chest pain Have interpretor for cath lab   Inpatient Medications    Scheduled Meds: . aspirin EC  81 mg Oral Daily  . atorvastatin  80 mg Oral Daily  . carvedilol  6.25 mg Oral BID WC  . cloNIDine  0.3 mg Oral TID  . enoxaparin (LOVENOX) injection  40 mg Subcutaneous Q24H  . ferrous sulfate  325 mg Oral Q breakfast  . furosemide  40 mg Intravenous Q12H  . gabapentin  600 mg Oral TID  . insulin aspart  0-15 Units Subcutaneous TID WC  . insulin aspart  0-5 Units Subcutaneous QHS  . insulin aspart protamine- aspart  20 Units Subcutaneous BID WC  . isosorbide mononitrate  30 mg Oral Daily  . lisinopril  40 mg Oral Daily  . loratadine  10 mg Oral Daily  . potassium chloride  40 mEq Oral Once  . sodium chloride flush  3 mL Intravenous Q12H  . sodium chloride flush  3 mL Intravenous Q12H   Continuous Infusions: . sodium chloride    . sodium chloride    . sodium chloride 10 mL/hr at 02/10/18 0518   PRN Meds: sodium chloride, sodium chloride, acetaminophen, albuterol, ondansetron (ZOFRAN) IV, sodium chloride flush, sodium chloride flush   Vital Signs    Vitals:   02/09/18 2017 02/10/18 0026 02/10/18 0503 02/10/18 0516  BP: (!) 163/77 (!) 151/74 (!) 148/73   Pulse: (!) 59 (!) 58 (!) 58   Resp: 18 18 18   Temp: 98.3 F (36.8 C) 98.1 F (36.7 C)  98.4 F (36.9 C)  TempSrc: Oral Oral  Oral  SpO2: 94% 99% 96%   Weight:   74.4 kg   Height:        Intake/Output Summary (Last 24 hours) at 02/10/2018 0817 Last data filed at 02/10/2018 0600 Gross per 24 hour  Intake 120 ml  Output 600 ml  Net -480 ml   Filed Weights   02/08/18 1227 02/09/18 0427 02/10/18 0503  Weight: 75.2 kg 75.3 kg 74.4 kg    Telemetry    NSR 02/10/2018  - Personally Reviewed  ECG    NSR LVH with strain  - Personally Reviewed  Physical Exam   Elderly [place] female  GEN: No acute distress.   Neck: No JVD Cardiac: RRR, no murmurs, rubs, or gallops.  Respiratory: basilar crackles  GI: Soft, nontender, non-distended  MS: No edema; No deformity. Neuro:  Nonfocal  Psych: Normal affect   Labs    Chemistry Recent Labs  Lab 02/08/18 0446 02/09/18 0418 02/10/18 0323  NA 136 138 139  K 4.5 3.6 3.3*  CL 101 101 101  CO2 26 27 30  GLUCOSE 205* 153* 98  BUN 11 20 20  CREATININE 0.66 0.87 0.75  CALCIUM 8.9 8.7* 8.6*  PROT 6.7  --   --   ALBUMIN 3.6  --   --   AST 56*  --   --   ALT 26  --   --   ALKPHOS 97  --   --   BILITOT 1.6*  --   --   GFRNONAA >60 >60 >60  GFRAA >60 >60 >60  ANIONGAP 9 10 8     Hematology Recent Labs  Lab 02/08/18 0446 02/09/18 0418  WBC 10.4 9.1  RBC 4.88 4.14  HGB 13.8 11.7*  HCT 43.8   36.2  MCV 89.8 87.4  MCH 28.3 28.3  MCHC 31.5 32.3  RDW 14.6 14.7  PLT 242 209    Cardiac Enzymes Recent Labs  Lab 02/08/18 0446  TROPONINI <0.03   No results for input(s): TROPIPOC in the last 168 hours.   BNP Recent Labs  Lab 02/08/18 0446  BNP 743.8*     DDimer No results for input(s): DDIMER in the last 168 hours.   Radiology    No results found.  Cardiac Studies   TTE EF 30-35% RWMA inferolateral and inferior   Patient Profile     67 y.o. female admitted with flash pulmonary edema and hypertensive urgency. No chest pain Negative troponin but TTE abnormal with EF 30-35% and RWMA;s Responded well to bipap Diuresis and Rx BP  Assessment & Plan    DCM:  Multiple coronary risk factors ECG LVH with strain Negative troponin May be dealing with Takatsubo Type etiology but has RWMA in inferior and inferolateral walls by TTE. Continue diuresis and RX for BP Discussed right and left cath with patient and grand daughter Risks including stroke, bleeding , intubation  Contrast reaction and need for emergent surgery discussed willing to proceed. Orders done have scheduled With Dr  Jordan latter this am   HTN:  Much improved continue current Rx  DM:  Discussed low carb diet.  Target hemoglobin A1c is 6.5 or less.  Continue current medications.       For questions or updates, please contact [facility] Please consult [email or web address] for contact info under        Signed, Davin Muramoto, MD  02/10/2018, 8:17 AM    

## 2018-02-10 NOTE — Interval H&P Note (Signed)
History and Physical Interval Note:  02/10/2018 9:25 AM  Carol Finley  has presented today for surgery, with the diagnosis of acute systolic heart failure  The various methods of treatment have been discussed with the patient and family. After consideration of risks, benefits and other options for treatment, the patient has consented to  Procedure(s): RIGHT/LEFT HEART CATH AND CORONARY ANGIOGRAPHY (N/A) as a surgical intervention .  The patient's history has been reviewed, patient examined, no change in status, stable for surgery.  I have reviewed the patient's chart and labs.  Questions were answered to the patient's satisfaction.    Cath Lab Visit (complete for each Cath Lab visit)  Clinical Evaluation Leading to the Procedure:   ACS: No.  Non-ACS:    Anginal Classification: CCS II2  Anti-ischemic medical therapy: Maximal Therapy (2 or more classes of medications)  Non-Invasive Test Results: Intermediate-risk stress test findings: cardiac mortality 1-3%/year  Prior CABG: No previous CABG       Theron Arista [facility] 02/10/2018 9:26 AM

## 2018-02-10 NOTE — Progress Notes (Signed)
Progress Note  Patient Name: Carol Finley Date of Encounter: 02/10/2018  Primary Cardiologist: Armanda Magic   Subjective   No dyspnea or chest pain Have interpretor for cath lab   Inpatient Medications    Scheduled Meds: . aspirin EC  81 mg Oral Daily  . atorvastatin  80 mg Oral Daily  . carvedilol  6.25 mg Oral BID WC  . cloNIDine  0.3 mg Oral TID  . enoxaparin (LOVENOX) injection  40 mg Subcutaneous Q24H  . ferrous sulfate  325 mg Oral Q breakfast  . furosemide  40 mg Intravenous Q12H  . gabapentin  600 mg Oral TID  . insulin aspart  0-15 Units Subcutaneous TID WC  . insulin aspart  0-5 Units Subcutaneous QHS  . insulin aspart protamine- aspart  20 Units Subcutaneous BID WC  . isosorbide mononitrate  30 mg Oral Daily  . lisinopril  40 mg Oral Daily  . loratadine  10 mg Oral Daily  . potassium chloride  40 mEq Oral Once  . sodium chloride flush  3 mL Intravenous Q12H  . sodium chloride flush  3 mL Intravenous Q12H   Continuous Infusions: . sodium chloride    . sodium chloride    . sodium chloride 10 mL/hr at 02/10/18 0518   PRN Meds: sodium chloride, sodium chloride, acetaminophen, albuterol, ondansetron (ZOFRAN) IV, sodium chloride flush, sodium chloride flush   Vital Signs    Vitals:   02/09/18 2017 02/10/18 0026 02/10/18 0503 02/10/18 0516  BP: (!) 163/77 (!) 151/74 (!) 148/73   Pulse: (!) 59 (!) 58 (!) 58   Resp: 18 18 18    Temp: 98.3 F (36.8 C) 98.1 F (36.7 C)  98.4 F (36.9 C)  TempSrc: Oral Oral  Oral  SpO2: 94% 99% 96%   Weight:   74.4 kg   Height:        Intake/Output Summary (Last 24 hours) at 02/10/2018 0817 Last data filed at 02/10/2018 0600 Gross per 24 hour  Intake 120 ml  Output 600 ml  Net -480 ml   Filed Weights   02/08/18 1227 02/09/18 0427 02/10/18 0503  Weight: 75.2 kg 75.3 kg 74.4 kg    Telemetry    NSR 02/10/2018  - Personally Reviewed  ECG    NSR LVH with strain  - Personally Reviewed  Physical Exam   Elderly [place] female  GEN: No acute distress.   Neck: No JVD Cardiac: RRR, no murmurs, rubs, or gallops.  Respiratory: basilar crackles  GI: Soft, nontender, non-distended  MS: No edema; No deformity. Neuro:  Nonfocal  Psych: Normal affect   Labs    Chemistry Recent Labs  Lab 02/08/18 0446 02/09/18 0418 02/10/18 0323  NA 136 138 139  K 4.5 3.6 3.3*  CL 101 101 101  CO2 26 27 30   GLUCOSE 205* 153* 98  BUN 11 20 20   CREATININE 0.66 0.87 0.75  CALCIUM 8.9 8.7* 8.6*  PROT 6.7  --   --   ALBUMIN 3.6  --   --   AST 56*  --   --   ALT 26  --   --   ALKPHOS 97  --   --   BILITOT 1.6*  --   --   GFRNONAA >60 >60 >60  GFRAA >60 >60 >60  ANIONGAP 9 10 8      Hematology Recent Labs  Lab 02/08/18 0446 02/09/18 0418  WBC 10.4 9.1  RBC 4.88 4.14  HGB 13.8 11.7*  HCT 43.8  36.2  MCV 89.8 87.4  MCH 28.3 28.3  MCHC 31.5 32.3  RDW 14.6 14.7  PLT 242 209    Cardiac Enzymes Recent Labs  Lab 02/08/18 0446  TROPONINI <0.03   No results for input(s): TROPIPOC in the last 168 hours.   BNP Recent Labs  Lab 02/08/18 0446  BNP 743.8*     DDimer No results for input(s): DDIMER in the last 168 hours.   Radiology    No results found.  Cardiac Studies   TTE EF 30-35% RWMA inferolateral and inferior   Patient Profile     67 y.o. female admitted with flash pulmonary edema and hypertensive urgency. No chest pain Negative troponin but TTE abnormal with EF 30-35% and RWMA;s Responded well to bipap Diuresis and Rx BP  Assessment & Plan    DCM:  Multiple coronary risk factors ECG LVH with strain Negative troponin May be dealing with Takatsubo Type etiology but has RWMA in inferior and inferolateral walls by TTE. Continue diuresis and RX for BP Discussed right and left cath with patient and grand daughter Risks including stroke, bleeding , intubation  Contrast reaction and need for emergent surgery discussed willing to proceed. Orders done have scheduled With Dr  [place] latter this am   HTN:  Much improved continue current Rx  DM:  Discussed low carb diet.  Target hemoglobin A1c is 6.5 or less.  Continue current medications.       For questions or updates, please contact [facility] Please consult [email or web address] for contact info under        Signed, Charlton Haws, MD  02/10/2018, 8:17 AM

## 2018-02-10 NOTE — Plan of Care (Signed)
  Problem: Education: Goal: Knowledge of General Education information will improve Description Including pain rating scale, medication(s)/side effects and non-pharmacologic comfort measures Outcome: Progressing   Problem: Health Behavior/Discharge Planning: Goal: Ability to manage health-related needs will improve Outcome: Progressing   Problem: Clinical Measurements: Goal: Ability to maintain clinical measurements within normal limits will improve Outcome: Progressing   Problem: Skin Integrity: Goal: Risk for impaired skin integrity will decrease Outcome: Progressing   Problem: Education: Goal: Ability to demonstrate management of disease process will improve Outcome: Progressing Goal: Ability to verbalize understanding of medication therapies will improve Outcome: Progressing   Problem: Activity: Goal: Capacity to carry out activities will improve Outcome: Progressing   Problem: Cardiac: Goal: Ability to achieve and maintain adequate cardiopulmonary perfusion will improve Outcome: Progressing

## 2018-02-10 NOTE — Progress Notes (Signed)
RN assisted pt to bedside chair and completed full linen change.

## 2018-02-10 NOTE — Progress Notes (Signed)
RN rounded on pt. Pt/family state they do not need anything at this time. 

## 2018-02-11 DIAGNOSIS — E1165 Type 2 diabetes mellitus with hyperglycemia: Secondary | ICD-10-CM

## 2018-02-11 DIAGNOSIS — I10 Essential (primary) hypertension: Secondary | ICD-10-CM

## 2018-02-11 DIAGNOSIS — I5021 Acute systolic (congestive) heart failure: Secondary | ICD-10-CM

## 2018-02-11 DIAGNOSIS — J9601 Acute respiratory failure with hypoxia: Secondary | ICD-10-CM

## 2018-02-11 DIAGNOSIS — E785 Hyperlipidemia, unspecified: Secondary | ICD-10-CM

## 2018-02-11 LAB — BASIC METABOLIC PANEL
Anion gap: 9 (ref 5–15)
BUN: 19 mg/dL (ref 8–23)
CHLORIDE: 99 mmol/L (ref 98–111)
CO2: 28 mmol/L (ref 22–32)
CREATININE: 0.87 mg/dL (ref 0.44–1.00)
Calcium: 8.5 mg/dL — ABNORMAL LOW (ref 8.9–10.3)
GFR calc Af Amer: >60 mL/min (ref >60)
GFR calc non Af Amer: >60 mL/min (ref >60)
GLUCOSE: 135 mg/dL — AB (ref 70–99)
POTASSIUM: 3.9 mmol/L (ref 3.5–5.1)
Sodium: 136 mmol/L (ref 135–145)

## 2018-02-11 LAB — GLUCOSE, CAPILLARY: Glucose-Capillary: 160 mg/dL — ABNORMAL HIGH (ref 70–99)

## 2018-02-11 LAB — MAGNESIUM: Magnesium: 2 mg/dL (ref 1.7–2.4)

## 2018-02-11 MED ORDER — CARVEDILOL 12.5 MG PO TABS: 12.5000 mg | ORAL_TABLET | Freq: Two times a day (BID) | ORAL | Status: DC

## 2018-02-11 MED ORDER — FUROSEMIDE 40 MG PO TABS: 40.0000 mg | ORAL_TABLET | Freq: Two times a day (BID) | ORAL | Status: DC

## 2018-02-11 MED ORDER — FUROSEMIDE 40 MG PO TABS: 40.0000 mg | ORAL_TABLET | Freq: Two times a day (BID) | ORAL | 0 refills | Status: DC

## 2018-02-11 MED ORDER — CLONIDINE HCL 0.2 MG PO TABS: 0.2000 mg | ORAL_TABLET | Freq: Three times a day (TID) | ORAL | Status: DC

## 2018-02-11 MED ORDER — CLONIDINE HCL 0.2 MG PO TABS: 0.2000 mg | ORAL_TABLET | Freq: Three times a day (TID) | ORAL | 0 refills | Status: DC

## 2018-02-11 MED ORDER — CARVEDILOL 12.5 MG PO TABS: 12.5000 mg | ORAL_TABLET | Freq: Two times a day (BID) | ORAL | 0 refills | Status: DC

## 2018-02-11 NOTE — Progress Notes (Signed)
Progress Note  Patient Name: Carol Finley Date of Encounter: 02/11/2018  Primary Cardiologist:   No primary care provider on file.   Subjective   She is breathing back to baseline and wants to go home.  No pain.  Ambulated   Inpatient Medications    Scheduled Meds: . aspirin EC  81 mg Oral Daily  . atorvastatin  80 mg Oral Daily  . carvedilol  6.25 mg Oral BID WC  . cloNIDine  0.3 mg Oral TID  . enoxaparin (LOVENOX) injection  40 mg Subcutaneous Q24H  . ferrous sulfate  325 mg Oral Q breakfast  . furosemide  40 mg Intravenous Q12H  . gabapentin  600 mg Oral TID  . insulin aspart  0-15 Units Subcutaneous TID WC  . insulin aspart  0-5 Units Subcutaneous QHS  . insulin aspart protamine- aspart  20 Units Subcutaneous BID WC  . isosorbide mononitrate  30 mg Oral Daily  . lisinopril  40 mg Oral Daily  . loratadine  10 mg Oral Daily  . sodium chloride flush  3 mL Intravenous Q12H  . sodium chloride flush  3 mL Intravenous Q12H   Continuous Infusions: . sodium chloride    . sodium chloride     PRN Meds: sodium chloride, sodium chloride, acetaminophen, albuterol, ondansetron (ZOFRAN) IV, sodium chloride flush, sodium chloride flush   Vital Signs    Vitals:   02/10/18 2110 02/11/18 0512 02/11/18 0514 02/11/18 0804  BP: 134/69 (!) 141/82  (!) 128/54  Pulse: 63 66  73  Resp: 18 16  17   Temp: 99 F (37.2 C) 99.3 F (37.4 C)  99.1 F (37.3 C)  TempSrc: Oral Oral  Oral  SpO2: 93% 95%  94%  Weight:   74.1 kg   Height:        Intake/Output Summary (Last 24 hours) at 02/11/2018 1020 Last data filed at 02/11/2018 0555 Gross per 24 hour  Intake 314.4 ml  Output 1800 ml  Net -1485.6 ml   Filed Weights   02/09/18 0427 02/10/18 0503 02/11/18 0514  Weight: 75.3 kg 74.4 kg 74.1 kg    Telemetry    NSR - Personally Reviewed  ECG    NA - Personally Reviewed  Physical Exam   GEN: No acute distress.   Neck: No  JVD Cardiac: RRR, no murmurs, rubs, or  gallops.  Respiratory: Clear  to auscultation bilaterally. GI: Soft, nontender, non-distended  MS: No  edema; No deformity.  Right wrist without bruising or bleeding.  Neuro:  Nonfocal  Psych: Normal affect   Labs    Chemistry Recent Labs  Lab 02/08/18 0446 02/09/18 0418 02/10/18 0323 02/11/18 0531  NA 136 138 139 136  K 4.5 3.6 3.3* 3.9  CL 101 101 101 99  CO2 26 27 30 28   GLUCOSE 205* 153* 98 135*  BUN 11 20 20 19   CREATININE 0.66 0.87 0.75 0.87  CALCIUM 8.9 8.7* 8.6* 8.5*  PROT 6.7  --   --   --   ALBUMIN 3.6  --   --   --   AST 56*  --   --   --   ALT 26  --   --   --   ALKPHOS 97  --   --   --   BILITOT 1.6*  --   --   --   GFRNONAA >60 >60 >60 >60  GFRAA >60 >60 >60 >60  ANIONGAP 9 10 8  9  Hematology Recent Labs  Lab 02/08/18 0446 02/09/18 0418  WBC 10.4 9.1  RBC 4.88 4.14  HGB 13.8 11.7*  HCT 43.8 36.2  MCV 89.8 87.4  MCH 28.3 28.3  MCHC 31.5 32.3  RDW 14.6 14.7  PLT 242 209    Cardiac Enzymes Recent Labs  Lab 02/08/18 0446  TROPONINI <0.03   No results for input(s): TROPIPOC in the last 168 hours.   BNP Recent Labs  Lab 02/08/18 0446  BNP 743.8*     DDimer No results for input(s): DDIMER in the last 168 hours.   Radiology    No results found.  Cardiac Studies   Cath 1. Mild coronary irregularities. No significant obstructive disease 2. Severe LV dysfunction 3. Normal LV filling pressures 4. Normal right heart pressures   Most Recent Value  Fick Cardiac Output 4.63 L/min  Fick Cardiac Output Index 2.61 (L/min)/BSA  RA A Wave 4 mmHg  RA V Wave 2 mmHg  RA Mean 1 mmHg  RV Systolic Pressure 27 mmHg  RV Diastolic Pressure -2 mmHg  RV EDP 2 mmHg  PA Systolic Pressure 33 mmHg  PA Diastolic Pressure 7 mmHg  PA Mean 16 mmHg  PW A Wave 14 mmHg  PW V Wave 13 mmHg  PW Mean 10 mmHg  AO Systolic Pressure 113 mmHg  AO Diastolic Pressure 65 mmHg  AO Mean 84 mmHg  LV Systolic Pressure 141 mmHg  LV Diastolic Pressure 7 mmHg    LV EDP 13 mmHg  AOp Systolic Pressure 143 mmHg  AOp Diastolic Pressure 63 mmHg  AOp Mean Pressure 95 mmHg  LVp Systolic Pressure 139 mmHg  LVp Diastolic Pressure 6 mmHg  LVp EDP Pressure 14 mmHg  QP/QS 1  TPVR Index 6.13 HRUI  TSVR Index 32.17 HRUI  PVR SVR Ratio 0.07  TPVR/TSVR Ratio    Echo: \ Study Conclusions  - Left ventricle: The cavity size was at the upper limits of   normal. Wall thickness was increased in a pattern of moderate   LVH. Systolic function was moderately to severely reduced. The   estimated ejection fraction was in the range of 30% to 35%. There   is akinesis of the inferolateral and inferior myocardium. Doppler   parameters are consistent with abnormal left ventricular   relaxation (grade 1 diastolic dysfunction). Doppler parameters   are consistent with high ventricular filling pressure. - Ventricular septum: Septal motion showed abnormal function and   dyssynergy. These changes are consistent with a left bundle   branch block. - Mitral valve: There was mild regurgitation. - Left atrium: The atrium was moderately dilated. - Pulmonary arteries: PA peak pressure: 41 mm Hg (S).  Patient Profile     67 y.o. female with flash pulmonary edema and hypertensive urgency. No chest pain.  Negative troponin but TTE abnormal with EF 30-35% and RWMA;s Responded well to bipap.  Diuresis and Rx BP  Assessment & Plan    ACUTE SYSTOLIC HF:  Non ischemic.  Mild coronary plaque as above. LV filling .  Further work up of etiology can be per Dr. Eden Emms as an outpatient.   Down 4.7 liters this admission. I talked to the family and patient about low salt.  She will need a dietary appt as an out patient.    HTN:    I am going to reduce the Clonidine as this should be weaned down over time and I will increase Coreg.  For now continue ACE inhibitor.  Can change to [facility]  as an outpatient.      For questions or updates, please contact [facility] Please consult  [email or web address] for contact info under Cardiology/STEMI.   Signed, Rollene Rotunda, MD  02/11/2018, 10:20 AM

## 2018-02-11 NOTE — Plan of Care (Signed)
Nutrition Education Note  RD consulted for nutrition education regarding CHF.  Spoke with pt and various family members at bedside. Pt and family declined use of an interpreter.  Pt states that she does not follow a typical "American" eating schedule and does not eat typical "American" foods. Pt states that she eats beans, rice, and vegetables and does not eat red meat. Pt also shares that she drinks diet Coke daily.  Per RN, pt had breakfast from [employer] this morning that her family brought. Discussed high sodium content of restaurant food with pt and family.  Pt denies consuming a lot of processed foods like canned soups, canned vegetables, and freezer meals but endorses consuming cheeses. Pt occasionally adds salt when cooking.  RD provided "Low Sodium Nutrition Therapy" handout from the [employer]. Reviewed patient's dietary recall. Provided examples on ways to decrease sodium intake in diet. Discouraged intake of processed foods and use of salt shaker. Encouraged fresh fruits and vegetables as well as whole grain sources of carbohydrates to maximize fiber intake.   RD discussed why it is important for patient to adhere to diet recommendations, and emphasized the role of fluids, foods to avoid, and importance of weighing self daily. Teach back method used.  Expect fair compliance.  Body mass index is 28.93 kg/m. Pt meets criteria for overweight based on current BMI.  Current diet order is Heart Healthy, patient is consuming approximately 15% of hospital-provided meals and 100% of meals provided by family at this time. Labs and medications reviewed. No further nutrition interventions warranted at this time. RD contact information provided. If additional nutrition issues arise, please re-consult RD.    Earma Reading, MS, RD, LDN Inpatient Clinical Dietitian Pager: [phone] Weekend/After Hours: [phone]

## 2018-02-11 NOTE — Progress Notes (Signed)
Discharge education provided at bedside with pt and pt family  Pt pharmacy closed, paged MD to send to [facility] on [address] per family request  Pt IVs removed by nursing student and telemetry removed  Pt has all belongings  Pt discharged via wheelchair with nursing student

## 2018-02-11 NOTE — Discharge Summary (Signed)
Physician Discharge Summary  Carol Finley RJJ:[id number] DOB: May 21, 1950 DOA: 02/08/2018  PCP: Hoy Register, MD  Admit date: 02/08/2018 Discharge date: 02/11/2018  Admitted From: Home Disposition:  Home  Recommendations for Outpatient Follow-up:  1. Follow up with PCP in 1 week with repeat BMP 2. Follow-up with cardiology as an outpatient 3. Follow-up in the ED if symptoms worsen or new appear   Home Health: No Equipment/Devices: None  Discharge Condition: Stable CODE STATUS: Full Diet recommendation: Heart Healthy / Carb Modified /fluid restriction up to 1200 cc a day  Brief/Interim Summary: 67 year old female with history of diastolic CHF, hypertension, hyperlipidemia, diabetes mellitus presented with shortness of breath.  She was found to have pulmonary edema on chest x-ray and admitted for CHF exacerbation, started on intravenous Lasix and nitroglycerin.  Cardiology was consulted.  Patient initially required BiPAP.  Respiratory status improved with diuresis.  EF was found to be 30 to 35%.  She underwent cardiac catheterization which showed no significant obstructive disease.  She has diuresed well.  Cardiology has cleared the patient for discharge.  Discharge Diagnoses:  Active Problems:   Diabetes type 2, uncontrolled (HCC)   Essential hypertension, benign   Hyperlipidemia   Acute systolic CHF (congestive heart failure) (HCC)   Acute respiratory failure with hypoxia (HCC)  Acute hypoxic respiratory failure probably secondary to CHF exacerbation -Initially required BiPAP.   -Resolved.  Currently on room air  Acute systolic heart failure -Echo shows EF of 30 to 35% with grade 1 diastolic dysfunction and akinesis of the inferior lateral and inferior myocardium.  Prior echo had EF of 60 to 65% in 2016 -Status post cardiac cath on 02/10/2018 which showed no significant obstructive disease. -Negative balance of 4774.4 cc since admission.   -Patient has been on  intravenous Lasix so far.  Cardiology has switched the patient to Lasix 40 mg twice a day, Coreg 12.5 mg twice a day.  Continue aspirin, lisinopril and isosorbide mononitrate.  Cardiology has cleared the patient for discharge.  Outpatient follow-up with cardiology -Outpatient follow-up of BMP.  Comply with medications, diet and fluid restriction of up to 1200 cc a day  Diabetes mellitus type 2 -Hemoglobin A1c 7.7 -Resume home regimen upon discharge.  Outpatient follow-up  Hypokalemia -Improved.  Outpatient follow-up  Hypertension -Blood pressure stable.  Continue lisinopril, Coreg, Lasix and isosorbide mononitrate.  Clonidine dose has been decreased to 0.2 mg 3 times a day by cardiology.  Outpatient follow-up  Hyperlipidemia -Continue Lipitor  Discharge Instructions  Discharge Instructions    (HEART FAILURE PATIENTS) Call MD:  Anytime you have any of the following symptoms: 1) 3 pound weight gain in 24 hours or 5 pounds in 1 week 2) shortness of breath, with or without a dry hacking cough 3) swelling in the hands, feet or stomach 4) if you have to sleep on extra pillows at night in order to breathe.   Complete by:  As directed    Ambulatory referral to Cardiology   Complete by:  As directed    CHF followup   Call MD for:  difficulty breathing, headache or visual disturbances   Complete by:  As directed    Call MD for:  extreme fatigue   Complete by:  As directed    Call MD for:  hives   Complete by:  As directed    Call MD for:  persistant dizziness or light-headedness   Complete by:  As directed    Call MD for:  persistant nausea and vomiting  Complete by:  As directed    Call MD for:  severe uncontrolled pain   Complete by:  As directed    Call MD for:  temperature >100.4   Complete by:  As directed    Diet - low sodium heart healthy   Complete by:  As directed    Diet Carb Modified   Complete by:  As directed    Discharge instructions   Complete by:  As directed     Fluid restriction up to 1200 cc a day   Increase activity slowly   Complete by:  As directed      Allergies as of 02/11/2018   No Known Allergies     Medication List    STOP taking these medications   meloxicam 7.5 MG tablet Commonly known as:  MOBIC     TAKE these medications   aspirin 81 MG tablet Take 1 tablet (81 mg total) by mouth daily.   atorvastatin 80 MG tablet Commonly known as:  LIPITOR Take 1 tablet (80 mg total) by mouth daily.   carvedilol 12.5 MG tablet Commonly known as:  COREG Take 1 tablet (12.5 mg total) by mouth 2 (two) times daily with a meal.   cetirizine 10 MG tablet Commonly known as:  ZYRTEC Take 1 tablet (10 mg total) by mouth daily.   cloNIDine 0.2 MG tablet Commonly known as:  CATAPRES Take 1 tablet (0.2 mg total) by mouth 3 (three) times daily. What changed:    medication strength  how much to take   ferrous sulfate 325 (65 FE) MG tablet Take 1 tablet (325 mg total) by mouth daily with breakfast.   furosemide 40 MG tablet Commonly known as:  LASIX Take 1 tablet (40 mg total) by mouth 2 (two) times daily. What changed:    medication strength  how much to take  when to take this   gabapentin 300 MG capsule Commonly known as:  NEURONTIN Take 2 capsules (600 mg total) by mouth 3 (three) times daily.   glucose blood test strip Use as instructed   glucose blood test strip 1 each by Other route 3 (three) times daily.   insulin NPH-regular Human (70-30) 100 UNIT/ML injection Inject 20 Units into the skin 2 (two) times daily with a meal.   Insulin Syringe-Needle U-100 31G X 15/64" 0.5 ML Misc Use as directed   isosorbide mononitrate 30 MG 24 hr tablet Commonly known as:  IMDUR Take 1 tablet (30 mg total) by mouth daily.   lisinopril 40 MG tablet Commonly known as:  PRINIVIL,ZESTRIL Take 1 tablet (40 mg total) by mouth daily.   metFORMIN 1000 MG tablet Commonly known as:  GLUCOPHAGE Take 1 tablet (1,000 mg total) by  mouth 2 (two) times daily with a meal.   TRUE METRIX METER Devi 1 each by Does not apply route 3 (three) times daily.   TRUEPLUS LANCETS 28G Misc USE AS DIRECTED   VENTOLIN HFA 108 (90 Base) MCG/ACT inhaler Generic drug:  albuterol INHALE 2 PUFFS INTO THE LUNGS EVERY SIX HOURS AS NEEDED FOR WHEEZING OR SHORTNESS OF BREATH. What changed:  See the new instructions.      Follow-up Information    Hoy Register, MD. Schedule an appointment as soon as possible for a visit in 1 week(s).   Specialty:  Family Medicine Why:  with repeat bmp Contact information: [address] [place] [address] [phone]        Wendall Stade, MD .  Specialty:  Cardiology Contact information: [address] [place] [address] [phone]          No Known Allergies  Consultations:  Cardiology   Procedures/Studies: Dg Chest Port 1 View  Result Date: 02/08/2018 CLINICAL DATA:  Acute onset shortness of breath 1 hour ago. EXAM: PORTABLE CHEST 1 VIEW COMPARISON:  11/11/2014 FINDINGS: Cardiac enlargement with pulmonary vascular congestion. Interstitial edema. No consolidation. No blunting of costophrenic angles. No pneumothorax. Mediastinal contours appear intact. Calcification of the aorta. Degenerative changes in the spine and shoulders. IMPRESSION: Cardiac enlargement with pulmonary vascular congestion and interstitial edema. Electronically Signed   By: Burman Nieves M.D.   On: 02/08/2018 04:50    Echo on 02/08/2018 Study Conclusions  - Left ventricle: The cavity size was at the upper limits of normal. Wall thickness was increased in a pattern of moderate LVH. Systolic function was moderately to severely reduced. The estimated ejection fraction was in the range of 30% to 35%. There is akinesis of the inferolateral and inferior myocardium. Doppler parameters are consistent with abnormal left ventricular relaxation (grade 1 diastolic  dysfunction). Doppler parameters are consistent with high ventricular filling pressure. - Ventricular septum: Septal motion showed abnormal function and dyssynergy. These changes are consistent with a left bundle branch block. - Mitral valve: There was mild regurgitation. - Left atrium: The atrium was moderately dilated. - Pulmonary arteries: PA peak pressure: 41 mm Hg (S).  Cardiac cath on 02/10/2018: No significant obstructive disease   Subjective: Patient seen and examined at bedside.  Granddaughter at bedside translated for the patient.  Patient currently denies any fever, nausea, vomiting, worsening chest pain or shortness of breath.  Discharge Exam: Vitals:   02/11/18 0512 02/11/18 0804  BP: (!) 141/82 (!) 128/54  Pulse: 66 73  Resp: 16 17  Temp: 99.3 F (37.4 C) 99.1 F (37.3 C)  SpO2: 95% 94%   Vitals:   02/10/18 2110 02/11/18 0512 02/11/18 0514 02/11/18 0804  BP: 134/69 (!) 141/82  (!) 128/54  Pulse: 63 66  73  Resp: 18 16  17   Temp: 99 F (37.2 C) 99.3 F (37.4 C)  99.1 F (37.3 C)  TempSrc: Oral Oral  Oral  SpO2: 93% 95%  94%  Weight:   74.1 kg   Height:        General: Pt is alert, awake, not in acute distress Cardiovascular: rate controlled, S1/S2 + Respiratory: bilateral decreased breath sounds at bases with some basilar crackles Abdominal: Soft, NT, ND, bowel sounds + Extremities: Trace edema, no cyanosis    The results of significant diagnostics from this hospitalization (including imaging, microbiology, ancillary and laboratory) are listed below for reference.     Microbiology: No results found for this or any previous visit (from the past 240 hour(s)).   Labs: BNP (last 3 results) Recent Labs    02/08/18 0446  BNP 743.8*   Basic Metabolic Panel: Recent Labs  Lab 02/08/18 0446 02/09/18 0418 02/10/18 0323 02/11/18 0531  NA 136 138 139 136  K 4.5 3.6 3.3* 3.9  CL 101 101 101 99  CO2 26 27 30 28   GLUCOSE 205* 153* 98 135*   BUN 11 20 20 19   CREATININE 0.66 0.87 0.75 0.87  CALCIUM 8.9 8.7* 8.6* 8.5*  MG  --   --  1.9 2.0   Liver Function Tests: Recent Labs  Lab 02/08/18 0446  AST 56*  ALT 26  ALKPHOS 97  BILITOT 1.6*  PROT 6.7  ALBUMIN 3.6   No results for input(s): LIPASE, AMYLASE in the last 168 hours. No results for input(s): AMMONIA in the last 168 hours. CBC: Recent Labs  Lab 02/08/18 0446 02/09/18 0418  WBC 10.4 9.1  NEUTROABS 5.4 6.2  HGB 13.8 11.7*  HCT 43.8 36.2  MCV 89.8 87.4  PLT 242 209   Cardiac Enzymes: Recent Labs  Lab 02/08/18 0446  TROPONINI <0.03   BNP: Invalid input(s): POCBNP CBG: Recent Labs  Lab 02/10/18 0756 02/10/18 1134 02/10/18 1648 02/10/18 2140 02/11/18 0744  GLUCAP 134* 144* 278* 147* 160*   D-Dimer No results for input(s): DDIMER in the last 72 hours. Hgb A1c Recent Labs    02/09/18 0418  HGBA1C 7.7*   Lipid Profile No results for input(s): CHOL, HDL, LDLCALC, TRIG, CHOLHDL, LDLDIRECT in the last 72 hours. Thyroid function studies No results for input(s): TSH, T4TOTAL, T3FREE, THYROIDAB in the last 72 hours.  Invalid input(s): FREET3 Anemia work up No results for input(s): VITAMINB12, FOLATE, FERRITIN, TIBC, IRON, RETICCTPCT in the last 72 hours. Urinalysis    Component Value Date/Time   COLORURINE lt. yellow 06/19/2010 1127   APPEARANCEUR Clear 12/10/2008 1406   LABSPEC 1.010 06/19/2010 1127   PHURINE 6.0 06/19/2010 1127   HGBUR negative 06/19/2010 1127   BILIRUBINUR NEG 03/05/2014 0837   PROTEINUR NEG 03/05/2014 0837   UROBILINOGEN 0.2 03/05/2014 0837   UROBILINOGEN 0.2 06/19/2010 1127   NITRITE NEG 03/05/2014 0837   NITRITE negative 06/19/2010 1127   LEUKOCYTESUR Trace 03/05/2014 0837   Sepsis Labs Invalid input(s): PROCALCITONIN,  WBC,  LACTICIDVEN Microbiology No results found for this or any previous visit (from the past 240 hour(s)).   Time coordinating discharge: 35 minutes  SIGNED:   Glade Lloyd,  MD  [facility] 02/11/2018, 10:50 AM Pager: [phone]  If 7PM-7AM, please contact night-coverage [email or web address] Password [email or web address]

## 2018-02-13 ENCOUNTER — Telehealth: Payer: Self-pay | Admitting: Nurse Practitioner

## 2018-02-13 MED FILL — CARVEDILOL 12.5 MG TABLET: 12.5 | 30 days supply | Qty: 60 | Fill #0

## 2018-02-13 MED FILL — FUROSEMIDE 40 MG TAB: 40 | 30 days supply | Qty: 60 | Fill #0

## 2018-02-13 MED FILL — cloNIDine HCL 0.2 MG TABS: 0.2 | 30 days supply | Qty: 90 | Fill #0

## 2018-02-13 NOTE — Telephone Encounter (Signed)
New Message   Patient has a TOC on 11/25 with Norma Fredrickson.

## 2018-02-13 NOTE — Telephone Encounter (Signed)
**Note De-Identified Carol Finley Obfuscation** Patient contacted regarding discharge from [facility] on 02/11/18.  Patient understands to follow up with provider Norma Fredrickson, NP on 02/27/18 at 10:00 at [address] [facility] [address] in [place]. Patient understands discharge instructions? Yes Patient understands medications and regiment? Yes Patient understands to bring all medications to this visit? Yes  The pt came to the phone and gave permission for me to talk to her granddaughter, Sue Lush. Sue Lush answered all of these TCM questions on the pts behalf.

## 2018-02-16 ENCOUNTER — Ambulatory Visit: Payer: Self-pay | Attending: Family Medicine | Admitting: Family Medicine

## 2018-02-16 ENCOUNTER — Encounter: Payer: Self-pay | Admitting: Family Medicine

## 2018-02-16 VITALS — BP 154/80 | HR 69 | Temp 97.6°F | Wt 166.6 lb

## 2018-02-16 DIAGNOSIS — E1149 Type 2 diabetes mellitus with other diabetic neurological complication: Secondary | ICD-10-CM

## 2018-02-16 DIAGNOSIS — M545 Low back pain: Secondary | ICD-10-CM

## 2018-02-16 DIAGNOSIS — I10 Essential (primary) hypertension: Secondary | ICD-10-CM

## 2018-02-16 DIAGNOSIS — Z833 Family history of diabetes mellitus: Secondary | ICD-10-CM | POA: Insufficient documentation

## 2018-02-16 DIAGNOSIS — Z794 Long term (current) use of insulin: Secondary | ICD-10-CM

## 2018-02-16 DIAGNOSIS — Z8249 Family history of ischemic heart disease and other diseases of the circulatory system: Secondary | ICD-10-CM | POA: Insufficient documentation

## 2018-02-16 DIAGNOSIS — E785 Hyperlipidemia, unspecified: Secondary | ICD-10-CM

## 2018-02-16 DIAGNOSIS — E1165 Type 2 diabetes mellitus with hyperglycemia: Secondary | ICD-10-CM

## 2018-02-16 DIAGNOSIS — G8929 Other chronic pain: Secondary | ICD-10-CM

## 2018-02-16 DIAGNOSIS — I110 Hypertensive heart disease with heart failure: Secondary | ICD-10-CM | POA: Insufficient documentation

## 2018-02-16 DIAGNOSIS — I5033 Acute on chronic diastolic (congestive) heart failure: Secondary | ICD-10-CM

## 2018-02-16 DIAGNOSIS — Z79899 Other long term (current) drug therapy: Secondary | ICD-10-CM | POA: Insufficient documentation

## 2018-02-16 DIAGNOSIS — Z7982 Long term (current) use of aspirin: Secondary | ICD-10-CM | POA: Insufficient documentation

## 2018-02-16 LAB — GLUCOSE, POCT (MANUAL RESULT ENTRY): POC GLUCOSE: 100 mg/dL — AB (ref 70–99)

## 2018-02-16 MED ORDER — INSULIN NPH ISOPHANE & REGULAR (70-30) 100 UNIT/ML ~~LOC~~ SUSP: 22.0000 [IU] | Freq: Two times a day (BID) | SUBCUTANEOUS | 6 refills | Status: AC

## 2018-02-16 MED FILL — !NOVOLIN 70/30 100 UNITS/ML: (70-30) 100 | 22 days supply | Qty: 10 | Fill #0

## 2018-02-16 NOTE — Progress Notes (Signed)
Subjective:  Patient ID: Carol Finley, female    DOB: 1950-11-21  Age: 67 y.o. 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 Patient was recently hospitalized on 02/08/2018 for Acute pulmonary edema, BNP was elevated at 743.8, troponins were negative.  Echocardiogram revealed EF of 30 to 35% with grade 1 diastolic dysfunction, akinesis of inferolateral and inferior myocardium. She underwent cardiac catheterization which showed no significant obstructive disease; patient was diuresed and when stable was discharged on 02/11/2018 with Lasix 40 mg and instructed to follow-up with PCP for BMET. She complains of new onset of lower back pain.  Lower Lumbar Pain - She complains of new onset of back that radiates to her kidneys, she describes the pattern as intermittent,. She says pain is a 6/10. She denies any recent falls or injury, urinary frequency, urinary urgency or dysuria. She takes two tylenol a day for pain control and says they help; walking makes the pain worse.   DM II - (HgbA1C 7.7 02-09-2018; previous 8.7) (Fasting Glucose 100 02-16-2018). She has not been seen my the ophthalmologists and requests a ambulatory referral. She says she has been adhering to her diabetic diet and medication regimen. She walks for exercise. Home monitoring of blood glucose reveals fasting glucose range of 100-280.  Denies numbness/tingl;ing, abdominal pain, flatus, or diarrhea. Denies hypoglycemic episodes.   Acute on Chronic Diastolic Heart Failure: (Latest EF 30-35% 02/08/2018; previous was 55-60% in 2016). Denies shortness of breath, nocturnal dyspnea, weight changes and edema. She admits she is trying to comply with fluid restriction and sodium restriction.  Essential Hypertension:  Not compliant with DASH DIET. Patient states she  is compliant with her medications does not have them with her. She denies chest pain, palpitations, visual changes, slurred speech, facial dropping or paralysis.    Requesting refills for all medications. She is up to date with her influenza vaccination   Past Medical History:  Diagnosis Date  . BACTERIAL PNEUMONIA 10/09/2009   Qualifier: Diagnosis of  By: Finley Dec, Carol    . CHF (congestive heart failure) (HCC)    grade 1 diastolic, 2016  . Diabetes mellitus without complication (HCC)   . Hypertension    Family History  Problem Relation Age of Onset  . Diabetes Mother   . Hypertension Mother   . Heart disease Mother   . Hypertension Father    Social History   Socioeconomic History  . Marital status: Widowed    Spouse name: Not on file  . Number of children: Not on file  . Years of education: Not on file  . Highest education level: Not on file  Occupational History  . Not on file  Social Needs  . Financial resource strain: Not on file  . Food insecurity:    Worry: Not on file    Inability: Not on file  . Transportation needs:    Medical: Not on file    Non-medical: Not on file  Tobacco Use  . Smoking status: Never Smoker  . Smokeless tobacco: Never Used  Substance and Sexual Activity  . Alcohol use: No  . Drug use: No  . Sexual activity: Not Currently  Lifestyle  . Physical activity:    Days per week: Not on file    Minutes per session: Not on file  . Stress: Not on file  Relationships  .  Social connections:    Talks on phone: Not on file    Gets together: Not on file    Attends religious service: Not on file    Active member of club or organization: Not on file    Attends meetings of clubs or organizations: Not on file    Relationship status: Not on file  . Intimate partner violence:    Fear of current or ex partner: Not on file    Emotionally abused: Not on file    Physically abused: Not on file    Forced sexual activity: Not on file  Other Topics  Concern  . Not on file  Social History Narrative  . Not on file     Outpatient Medications Prior to Visit  Medication Sig Dispense Refill  . aspirin 81 MG tablet Take 1 tablet (81 mg total) by mouth daily. 30 tablet 5  . atorvastatin (LIPITOR) 80 MG tablet Take 1 tablet (80 mg total) by mouth daily. 30 tablet 6  . Blood Glucose Monitoring Suppl (TRUE METRIX METER) DEVI 1 each by Does not apply route 3 (three) times daily. 1 Device 0  . carvedilol (COREG) 12.5 MG tablet Take 1 tablet (12.5 mg total) by mouth 2 (two) times daily with a meal. 60 tablet 0  . cetirizine (ZYRTEC) 10 MG tablet Take 1 tablet (10 mg total) by mouth daily. 30 tablet 1  . cloNIDine (CATAPRES) 0.2 MG tablet Take 1 tablet (0.2 mg total) by mouth 3 (three) times daily. 90 tablet 0  . ferrous sulfate 325 (65 FE) MG tablet Take 1 tablet (325 mg total) by mouth daily with breakfast. 100 tablet 1  . furosemide (LASIX) 40 MG tablet Take 1 tablet (40 mg total) by mouth 2 (two) times daily. 60 tablet 0  . gabapentin (NEURONTIN) 300 MG capsule Take 2 capsules (600 mg total) by mouth 3 (three) times daily. 180 capsule 6  . glucose blood (TRUE METRIX BLOOD GLUCOSE TEST) test strip 1 each by Other route 3 (three) times daily. 100 each 12  . glucose blood test strip Use as instructed 100 each 12  . Insulin Syringe-Needle U-100 (BD INSULIN SYRINGE ULTRAFINE) 31G X 15/64" 0.5 ML MISC Use as directed 100 each 2  . isosorbide mononitrate (IMDUR) 30 MG 24 hr tablet Take 1 tablet (30 mg total) by mouth daily. 30 tablet 6  . lisinopril (PRINIVIL,ZESTRIL) 40 MG tablet Take 1 tablet (40 mg total) by mouth daily. 30 tablet 6  . metFORMIN (GLUCOPHAGE) 1000 MG tablet Take 1 tablet (1,000 mg total) by mouth 2 (two) times daily with a meal. 60 tablet 6  . TRUEPLUS LANCETS 28G MISC USE AS DIRECTED 100 each 11  . VENTOLIN HFA 108 (90 Base) MCG/ACT inhaler INHALE 2 PUFFS INTO THE LUNGS EVERY SIX HOURS AS NEEDED FOR WHEEZING OR SHORTNESS OF BREATH.  (Patient taking differently: Inhale 2 puffs into the lungs every 6 (six) hours as needed. ) 18 g 1  . insulin NPH-regular Human (NOVOLIN 70/30) (70-30) 100 UNIT/ML injection Inject 20 Units into the skin 2 (two) times daily with a meal. 30 mL 6   No facility-administered medications prior to visit.     ROS Review of Systems  Constitutional: Negative for chills, diaphoresis, fatigue, fever and unexpected weight change.  Eyes: Negative for visual disturbance.  Respiratory: Negative for cough, chest tightness, shortness of breath and wheezing.   Cardiovascular: Negative for chest pain, palpitations and leg swelling.  Gastrointestinal: Negative for abdominal distention, abdominal  pain, diarrhea, nausea and vomiting.  Endocrine: Negative for polydipsia, polyphagia and polyuria.  Genitourinary: Negative for dysuria, flank pain, frequency and urgency.  Musculoskeletal: Positive for back pain. Negative for arthralgias, gait problem and joint swelling.  Skin: Negative for color change, pallor, rash and wound.  Neurological: Positive for weakness. Negative for facial asymmetry, speech difficulty, numbness and headaches.    Objective:  BP (!) 154/80   Pulse 69   Temp 97.6 F (36.4 C) (Oral)   Wt 166 lb 9.6 oz (75.6 kg)   SpO2 99%   BMI 29.51 kg/m   BP/Weight 02/16/2018 02/11/2018 01/05/2018  Systolic BP 154 128 128  Diastolic BP 80 54 76  Wt. (Lbs) 166.6 163.3 173.2  BMI 29.51 28.93 32.73   Lab Results  Component Value Date   HGBA1C 7.7 (H) 02/09/2018   Lab Results  Component Value Date   POCGLU 100 (A) 02/16/2018       Physical Exam  Constitutional: She is oriented to person, place, and time. She appears well-developed and well-nourished. No distress.  Neck: No JVD present.  Cardiovascular: Normal rate, regular rhythm, normal heart sounds and intact distal pulses. Exam reveals no gallop and no friction rub.  No murmur heard. Pulmonary/Chest: Breath sounds normal. She has no  wheezes. She has no rales.  Abdominal: Soft. Bowel sounds are normal. She exhibits no distension. There is tenderness.  Musculoskeletal: Normal range of motion.       Arms: Patient with complaints of lower lumbar pain. Passive/Active ROM intact  Neurological: She is alert and oriented to person, place, and time. She displays normal reflexes. No sensory deficit.  Skin: Skin is warm and dry. Capillary refill takes less than 2 seconds. No rash noted.  Psychiatric: She has a normal mood and affect. Her behavior is normal. Judgment and thought content normal.  Vitals reviewed.    Assessment & Plan:   1. Uncontrolled type 2 diabetes mellitus with hyperglycemia (HCC) Not Controlled  - POCT glucose 100 - (HgbA1C 7.7) - Increase insulin NPH from 20 units twice a day to 22 units twice a day. - Diet, medication compliance and importance of physical activity discussed with patient. Patient also instructed to watch for signs of hypoglycemia. She verbalizes understand. - Microalbumin - Lipid Panel - Ambulatory Referral to Ophthalmology -discussed community resources for eye exams as she currently has no medical coverage - insulin NPH-regular Human (70-30) 100 UNIT/ML injection; Inject 22 Units into the skin 2 (two) times daily with a meal.  Dispense: 30 mL; Refill: 6  2. Essential hypertension, benign Not Controlled - 154/80 - DASH diet, sodium restriction and exercise discussed with patient. Patient is to bring all her medications in tomorrow for med reconciliation with the clinical pharmacist Plan is to increase dose of isosorbide nitrate if blood pressure is still elevated - Basic Metabolic Panel   3. Type 2 diabetes mellitus with other neurologic complication, with long-term current use of insulin (HCC) Not Controlled HgbA1C 7.7 Fasting Glucose 100 - See plan #1 above  4. Acute on chronic diastolic (congestive) heart failure (HCC) Euvolemic  (Latest EF 30-35% 02/08/2018; previous was  55-60% in 2016) - Fluid and sodium restriction discussed with patient. Patient instructed to weigh herself daily at the same time and on the same scale.  - Continue current medication regimen.  - Obtain BMET to check potassium and creatinine levels  5. Chronic midline low back pain without sciatica New Onset - Continue Tylenol 650 mg twice a day for pain  relief; apply heat - If no significant improvement will place PT referral.  6. Hyperlipidemia, unspecified hyperlipidemia type Above Goal - Continue current medication regimen of Atorvastation 80 mg daily. - Low fat diet and lifestyle changes discussed with patient.   Meds ordered this encounter  Medications  . insulin NPH-regular Human (70-30) 100 UNIT/ML injection    Sig: Inject 22 Units into the skin 2 (two) times daily with a meal.    Dispense:  30 mL    Refill:  6    Discontinue previous dose    Follow-up: Return in about 1 day (around 02/17/2018) for with Franky Macho med recon; 3 months PCP chhronic medical conditions.   Hoy Register MD

## 2018-02-16 NOTE — Patient Instructions (Signed)
Hipertensión  Hypertension  La hipertensión, conocida comúnmente como presión arterial alta, se produce cuando la sangre bombea en las arterias con mucha fuerza. Las arterias son los vasos sanguíneos que transportan la sangre desde el corazón al resto del cuerpo. La hipertensión hace que el corazón haga más esfuerzo para bombear sangre y puede provocar que las arterias se estrechen o endurezcan. La hipertensión no tratada o no controlada puede causar infarto de miocardio, accidentes cerebrovasculares, enfermedad renal y otros problemas.  Una lectura de la presión arterial consiste de un número más alto sobre un número más bajo. En condiciones ideales, la presión arterial debe estar por debajo de 120/80. El primer número ("superior") es la presión sistólica. Es la medida de la presión de las arterias cuando el corazón late. El segundo número ("inferior") es la presión diastólica. Es la medida de la presión en las arterias cuando el corazón se relaja.  ¿Cuáles son las causas?  Se desconoce la causa de esta afección.  ¿Qué incrementa el riesgo?  Algunos factores de riesgo de hipertensión están bajo su control. Otros no.  Factores que puede modificar  · Fumar.  · Tener diabetes mellitus tipo 2, colesterol alto, o ambos.  · No hacer la cantidad suficiente de actividad física o ejercicio.  · Tener sobrepeso.  · Consumir mucha grasa, azúcar, calorías o sal (sodio) en su dieta.  · Beber alcohol en exceso.  Factores que son difíciles o imposibles de modificar  · Tener enfermedad renal crónica.  · Tener antecedentes familiares de presión arterial alta.  · La edad. Los riesgos aumentan con la edad.  · La raza. El riesgo es mayor para las personas afroamericanas.  · El sexo. Antes de los 45 años, los hombres corren más riesgo que las mujeres. Después de los 65 años, las mujeres corren más riesgo que los hombres.  · Tener apnea obstructiva del sueño.  · El estrés.  ¿Cuáles son los signos o los síntomas?   La presión arterial extremadamente alta (crisis hipertensiva) puede provocar:  · Dolor de cabeza.  · Ansiedad.  · Falta de aire.  · Hemorragia nasal.  · Náuseas y vómitos.  · Dolor de pecho intenso.  · Una crisis de movimientos que no puede controlar (convulsiones).    ¿Cómo se diagnostica?  Esta afección se diagnostica midiendo su presión arterial mientras se encuentra sentado, con el brazo apoyado sobre una superficie. El brazalete del tensiómetro debe colocarse directamente sobre la piel de la parte superior del brazo y al nivel de su corazón. Debe medirla al menos dos veces en el mismo brazo. Determinadas condiciones pueden causar una diferencia de presión arterial entre el brazo izquierdo y el derecho.  Ciertos factores pueden provocar que las lecturas de la presión arterial sean inferiores o superiores a lo normal (elevadas) por un período corto de tiempo:  · Si su presión arterial es más alta cuando se encuentra en el consultorio del médico que cuando la mide en su hogar, se denomina "hipertensión de bata blanca". La mayoría de las personas que tienen esta afección no deben ser medicadas.  · Si su presión arterial es más alta en el hogar que cuando se encuentra en el consultorio del médico, se denomina "hipertensión enmascarada". La mayoría de las personas que tienen esta afección deben ser medicadas para controlar la presión arterial.    Si tiene una lecturas de presión arterial alta durante una visita o si tiene presión arterial normal con otros factores de riesgo:  · Es posible que se le   pida que regrese otro día para volver a controlar su presión arterial.  · Se le puede pedir que se controle la presión arterial en su casa durante 1 semana o más.    Si se le diagnostica hipertensión, es posible que se le realicen otros análisis de sangre o estudios de diagnóstico por imágenes para ayudar a su médico a comprender su riesgo general de tener otras afecciones.  ¿Cómo se trata?   Esta afección se trata haciendo cambios saludables en el estilo de vida, tales como ingerir alimentos saludables, realizar más ejercicio y reducir el consumo de alcohol. El médico puede recetarle medicamentos si los cambios en el estilo de vida no son suficientes para lograr controlar la presión arterial y si:  · Su presión arterial sistólica está por encima de 130.  · Su presión arterial diastólica está por encima de 80.    La presión arterial deseada puede variar en función de las enfermedades, la edad y otros factores personales.  Siga estas instrucciones en su casa:  Comida y bebida  · Siga una dieta con alto contenido de fibras y potasio, y con bajo contenido de sodio, azúcar agregada y grasas. Un ejemplo de plan alimenticio es la dieta DASH (Dietary Approaches to Stop Hypertension, Métodos alimenticios para detener la hipertensión). Para alimentarse de esta manera:  ? Coma mucha fruta y verdura fresca. Trate de que la mitad del plato de cada comida sea de frutas y verduras.  ? Coma cereales integrales, como pasta integral, arroz integral y pan integral. Llene aproximadamente un cuarto del plato con cereales integrales.  ? Coma y beba productos lácteos con bajo contenido de grasa, como leche descremada o yogur bajo en grasas.  ? Evite la ingesta de cortes de carne grasa, carne procesada o curada, y carne de ave con piel. Llene aproximadamente un cuarto del plato con proteínas magras, como pescado, pollo sin piel, frijoles, huevos y tofu.  ? Evite ingerir alimentos prehechos o procesados. En general, estos tienen mayor cantidad de sodio, azúcar agregada y grasa.  · Reduzca su ingesta diaria de sodio. La mayoría de las personas que tienen hipertensión deben comer menos de 1500 mg de sodio por día.  · Limite el consumo de alcohol a no más de 1 medida por día si es mujer y no está embarazada y a 2 medidas por día si es hombre. Una medida equivale  a 12 onzas de cerveza, 5 onzas de vino o 1½ onzas de bebidas alcohólicas de alta graduación.  Estilo de vida  · Trabaje con su médico para mantener un peso saludable o perder peso. Pregúntele cual es su peso recomendado.  · Realice al menos 30 minutos de ejercicio que haga que se acelere su corazón (ejercicio aeróbico) la mayoría de los días de la semana. Estas actividades pueden incluir caminar, nadar o andar en bicicleta.  · Incluya ejercicios para fortalecer sus músculos (ejercicios de resistencia), como pilates o levantamiento de pesas, como parte de su rutina semanal de ejercicios. Intente realizar 30 minutos de este tipo de ejercicios al menos tres días a la semana.  · No consuma ningún producto que contenga nicotina o tabaco, como cigarrillos y cigarrillos electrónicos. Si necesita ayuda para dejar de fumar, consulte al médico.  · Contrólese la presión arterial en su casa según las indicaciones del médico.  · Concurra a todas las visitas de control como se lo haya indicado el médico. Esto es importante.  Medicamentos  · Tome los medicamentos de venta libre y los recetados solamente como se   lo haya indicado el médico. Siga cuidadosamente las indicaciones. Los medicamentos para la presión arterial deben tomarse según las indicaciones.  · No omita las dosis de medicamentos para la presión arterial. Si lo hace, estará en riesgo de tener problemas y puede hacer que los medicamentos sean menos eficaces.  · Pregúntele a su médico a qué efectos secundarios o reacciones a los medicamentos debe prestar atención.  Comuníquese con un médico si:  · Piensa que tiene una reacción a un medicamento que está tomando.  · Tiene dolores de cabeza frecuentes (recurrentes).  · Siente mareos.  · Tiene hinchazón en los tobillos.  · Tiene problemas de visión.  Solicite ayuda de inmediato si:  · Siente un dolor de cabeza intenso o confusión.  · Siente debilidad inusual o adormecimiento.  · Siente que va a desmayarse.   · Siente un dolor intenso en el pecho o el abdomen.  · Vomita repetidas veces.  · Tiene dificultad para respirar.  Resumen  · La hipertensión se produce cuando la sangre bombea en las arterias con mucha fuerza. Si esta afección no se controla, podría correr riesgo de tener complicaciones graves.  · La presión arterial deseada puede variar en función de las enfermedades, la edad y otros factores personales. Para la mayoría de las personas, una presión arterial normal es menor que 120/80.  · La hipertensión se trata con cambios en el estilo de vida, medicamentos o una combinación de ambos. Los cambios en el estilo de vida incluyen pérdida de peso, ingerir alimentos sanos, seguir una dieta baja en sodio, hacer más ejercicio y limitar el consumo de alcohol.  Esta información no tiene como fin reemplazar el consejo del médico. Asegúrese de hacerle al médico cualquier pregunta que tenga.  Document Released: 03/22/2005 Document Revised: 03/03/2016 Document Reviewed: 03/03/2016  Elsevier Interactive Patient Education © 2018 [employer]

## 2018-02-17 ENCOUNTER — Encounter: Payer: Self-pay | Admitting: Pharmacist

## 2018-02-17 ENCOUNTER — Ambulatory Visit: Payer: Self-pay | Attending: Family Medicine | Admitting: Pharmacist

## 2018-02-17 ENCOUNTER — Telehealth: Payer: Self-pay

## 2018-02-17 VITALS — BP 128/66 | HR 61

## 2018-02-17 DIAGNOSIS — Z8249 Family history of ischemic heart disease and other diseases of the circulatory system: Secondary | ICD-10-CM | POA: Insufficient documentation

## 2018-02-17 DIAGNOSIS — I10 Essential (primary) hypertension: Secondary | ICD-10-CM | POA: Insufficient documentation

## 2018-02-17 DIAGNOSIS — Z79899 Other long term (current) drug therapy: Secondary | ICD-10-CM | POA: Insufficient documentation

## 2018-02-17 LAB — BASIC METABOLIC PANEL
BUN / CREAT RATIO: 38 — AB (ref 12–28)
BUN: 26 mg/dL (ref 8–27)
CALCIUM: 9.6 mg/dL (ref 8.7–10.3)
CHLORIDE: 98 mmol/L (ref 96–106)
CO2: 25 mmol/L (ref 20–29)
CREATININE: 0.69 mg/dL (ref 0.57–1.00)
GFR calc Af Amer: 104 mL/min/{1.73_m2} (ref >59)
GFR calc non Af Amer: 90 mL/min/{1.73_m2} (ref >59)
GLUCOSE: 92 mg/dL (ref 65–99)
Potassium: 4.5 mmol/L (ref 3.5–5.2)
Sodium: 141 mmol/L (ref 134–144)

## 2018-02-17 NOTE — Telephone Encounter (Signed)
-----   Message from Hoy Register, MD sent at 02/17/2018 11:13 AM EST ----- Please inform the patient that labs are normal. Thank you.

## 2018-02-17 NOTE — Patient Instructions (Signed)

## 2018-02-17 NOTE — Progress Notes (Signed)
   S:    PCP: Dr. Alvis Lemmings Patient arrives in good spirits. Presents to the clinic for hypertension management. Patient was referred by Dr. Alvis Lemmings on 02/16/18. BP was 154/80 at that visit. Dr. Alvis Lemmings gave orders to re-check today and increase isosorbide if needed.  Denies chest pain, shortness of breath, headache, or blurred vision. Patient reports adherence with medications.  Current BP Medications include:   - carvedilol 12.5 mg BID - clonidine 0.2 mg TID - furosemide 40 mg BID - isosorbide mononitrate 30 mg daily - lisinopril 40 mg daily  Dietary habits include: pt limits salt, denies caffeine use Exercise habits include: walks daily Family / Social history: DM (mother), heart disease (mother), HTN (mother, father); never smoker; denies alcohol  Home BP readings:  - Gives wide range - 100s - 140s SBPs  O:  L arm after 5 minutes rest: 128/66, HR 61 Last 3 Office BP readings: BP Readings from Last 3 Encounters:  02/16/18 (!) 154/80  02/11/18 (!) 128/54  01/05/18 128/76   BMET    Component Value Date/Time   NA 141 02/16/2018 1006   K 4.5 02/16/2018 1006   CL 98 02/16/2018 1006   CO2 25 02/16/2018 1006   GLUCOSE 92 02/16/2018 1006   GLUCOSE 135 (H) 02/11/2018 0531   BUN 26 02/16/2018 1006   CREATININE 0.69 02/16/2018 1006   CREATININE 0.69 05/10/2016 0905   CALCIUM 9.6 02/16/2018 1006   GFRNONAA 90 02/16/2018 1006   GFRNONAA >89 05/10/2016 0905   GFRAA 104 02/16/2018 1006   GFRAA >89 05/10/2016 0905   Renal function: Estimated Creatinine Clearance: 66.5 mL/min (by C-G formula based on SCr of 0.69 mg/dL).  A/P: Hypertension longstanding currently controlled on current medications. BP Goal <130/80 mmHg. Patient is adherent with current medications.  -Continued antihypertensive medications.  -Encouraged patient to pick-up insulin and other oral medications from pharmacy -Counseled on lifestyle modifications for blood pressure control including reduced dietary sodium,  increased exercise, adequate sleep  Results reviewed and written information provided. Total time in face-to-face counseling 15 minutes.   F/U Clinic Visit 05/10/2018.   Butch Penny, PharmD, CPP Clinical Pharmacist [facility] [phone]

## 2018-02-17 NOTE — Telephone Encounter (Signed)
Patient was called and informed of lab results via interpreter. 

## 2018-02-27 ENCOUNTER — Ambulatory Visit (INDEPENDENT_AMBULATORY_CARE_PROVIDER_SITE_OTHER): Payer: Self-pay | Admitting: Nurse Practitioner

## 2018-02-27 ENCOUNTER — Encounter: Payer: Self-pay | Admitting: Nurse Practitioner

## 2018-02-27 VITALS — BP 92/64 | HR 64 | Wt 166.6 lb

## 2018-02-27 DIAGNOSIS — I428 Other cardiomyopathies: Secondary | ICD-10-CM

## 2018-02-27 LAB — BASIC METABOLIC PANEL
BUN/Creatinine Ratio: 23 (ref 12–28)
BUN: 25 mg/dL (ref 8–27)
CO2: 24 mmol/L (ref 20–29)
Calcium: 9.6 mg/dL (ref 8.7–10.3)
Chloride: 94 mmol/L — ABNORMAL LOW (ref 96–106)
Creatinine, Ser: 1.07 mg/dL — ABNORMAL HIGH (ref 0.57–1.00)
GFR calc Af Amer: 62 mL/min/{1.73_m2} (ref >59)
GFR calc non Af Amer: 54 mL/min/{1.73_m2} — ABNORMAL LOW (ref >59)
Glucose: 178 mg/dL — ABNORMAL HIGH (ref 65–99)
Potassium: 4.2 mmol/L (ref 3.5–5.2)
Sodium: 137 mmol/L (ref 134–144)

## 2018-02-27 LAB — CBC
Hematocrit: 39.7 % (ref 34.0–46.6)
Hemoglobin: 12.9 g/dL (ref 11.1–15.9)
MCH: 28.3 pg (ref 26.6–33.0)
MCHC: 32.5 g/dL (ref 31.5–35.7)
MCV: 87 fL (ref 79–97)
Platelets: 278 10*3/uL (ref 150–450)
RBC: 4.56 x10E6/uL (ref 3.77–5.28)
RDW: 14.9 % (ref 12.3–15.4)
WBC: 6.4 10*3/uL (ref 3.4–10.8)

## 2018-02-27 LAB — PRO B NATRIURETIC PEPTIDE: NT-Pro BNP: 2176 pg/mL — ABNORMAL HIGH (ref 0–301)

## 2018-02-27 MED ORDER — CARVEDILOL 12.5 MG PO TABS: 6.2500 mg | ORAL_TABLET | Freq: Two times a day (BID) | ORAL | 0 refills | Status: DC

## 2018-02-27 NOTE — Progress Notes (Signed)
CARDIOLOGY OFFICE NOTE  Date:  02/27/2018    [name] Date of Birth: 01/22/1951 Medical Record #[id number]  PCP:  Hoy Register, MD  Cardiologist:  Mayford Knife (seen 11/2014). [name] 02/2018  Chief Complaint  Patient presents with  . Congestive Heart Failure    Post hospital visit - seen for Dr. Fuller Plan    History of Present Illness: Carol Finley is a 67 y.o. female who presents today for a post hospital visit. Seen for Dr. Fuller Plan. Previously followed by Tereso Newcomer, PA.   She has a history of chronic diastolic CHF, HTN, & HLD.   Admitted earlier this month with abrupt onset of dyspnea while sleeping. EMS was called. Required Bipap. The record noted that she follows closely with her PCP but has not seen a cardiologist since an admission in 2016 for chest pain (seen by Dr. Mayford Knife). She had a NST at that time which was felt to have diaphragmatic and breast attenuation but negative for inducible ischemia. Echo in 2016 with EF 55-60%, no wall motion abnormalities, and G1DD.   She was markedly hypertensive, tachycardic/tachpneic. She was treated with IV lasix and started on NTG gtt. Echo noted new reduction in her EF - she underwent cardiac cath - nonobstructive CAD noted but severe systolic dysfunction noted - she is to be managed medically.   Comes in today. Here alone. The interpreter has joined [place]. The patient identifies Dr. Mayford Knife as her doctor. She notes she is doing well but is "dizzy". No chest pain. Breathing is fine. No swelling. She says she is restricting her salt. Her dizziness is her main concern.   She brought in all of her bottles of medicines - she has 3 bottles of Clonidine - varying doses, 2 bottles of lasix and 2 bottles of Coreg. She does not have Imdur or Lisinopril in her bag and says she is not taking those.    Past Medical History:  Diagnosis Date  . BACTERIAL PNEUMONIA 10/09/2009   Qualifier: Diagnosis of  By:  Huntley Dec, Scott    . CHF (congestive heart failure) (HCC)    grade 1 diastolic, 2016  . Diabetes mellitus without complication (HCC)   . Hypertension     Past Surgical History:  Procedure Laterality Date  . RIGHT/LEFT HEART CATH AND CORONARY ANGIOGRAPHY N/A 02/10/2018   Procedure: RIGHT/LEFT HEART CATH AND CORONARY ANGIOGRAPHY;  Surgeon: Swaziland, Peter M, MD;  Location: [facility] INVASIVE CV LAB;  Service: Cardiovascular;  Laterality: N/A;     Medications: Current Meds  Medication Sig  . aspirin 81 MG tablet Take 1 tablet (81 mg total) by mouth daily.  Marland Kitchen atorvastatin (LIPITOR) 80 MG tablet Take 1 tablet (80 mg total) by mouth daily.  . carvedilol (COREG) 12.5 MG tablet Take 0.5 tablets (6.25 mg total) by mouth 2 (two) times daily with a meal.  . cloNIDine (CATAPRES) 0.2 MG tablet Take 1 tablet (0.2 mg total) by mouth 3 (three) times daily.  . ferrous sulfate 325 (65 FE) MG tablet Take 1 tablet (325 mg total) by mouth daily with breakfast.  . furosemide (LASIX) 40 MG tablet Take 1 tablet (40 mg total) by mouth 2 (two) times daily.  Marland Kitchen gabapentin (NEURONTIN) 300 MG capsule Take 2 capsules (600 mg total) by mouth 3 (three) times daily.  . insulin NPH-regular Human (70-30) 100 UNIT/ML injection Inject 22 Units into the skin 2 (two) times daily with a meal.  . Insulin Syringe-Needle U-100 (BD INSULIN SYRINGE ULTRAFINE) 31G X  15/64" 0.5 ML MISC Use as directed  . metFORMIN (GLUCOPHAGE) 1000 MG tablet Take 1 tablet (1,000 mg total) by mouth 2 (two) times daily with a meal.  . VENTOLIN HFA 108 (90 Base) MCG/ACT inhaler INHALE 2 PUFFS INTO THE LUNGS EVERY SIX HOURS AS NEEDED FOR WHEEZING OR SHORTNESS OF BREATH. (Patient taking differently: Inhale 2 puffs into the lungs every 6 (six) hours as needed. )  . [DISCONTINUED] carvedilol (COREG) 12.5 MG tablet Take 1 tablet (12.5 mg total) by mouth 2 (two) times daily with a meal.     Allergies: No Known Allergies  Social History: The patient  reports  that she has never smoked. She has never used smokeless tobacco. She reports that she does not drink alcohol or use drugs.   Family History: The patient's family history includes Diabetes in her mother; Heart disease in her mother; Hypertension in her father and mother.   Review of Systems: Please see the history of present illness.   Otherwise, the review of systems is positive for none.   All other systems are reviewed and negative.   Physical Exam: VS:  BP 92/64 (BP Location: Left Arm, Patient Position: Sitting, Cuff Size: Normal) Comment: 90/60 right  Pulse 64   Wt 166 lb 9.6 oz (75.6 kg)   SpO2 100% Comment: at rest  BMI 29.51 kg/m  .  BMI Body mass index is 29.51 kg/m.  Wt Readings from Last 3 Encounters:  02/27/18 166 lb 9.6 oz (75.6 kg)  02/16/18 166 lb 9.6 oz (75.6 kg)  02/11/18 163 lb 4.8 oz (74.1 kg)   BP is 90/60 in the right arm.  General: Pleasant. Alert and in no acute distress.   HEENT: Normal.  Neck: Supple, no JVD, carotid bruits, or masses noted.  Cardiac: Regular rate and rhythm. No murmurs, rubs, or gallops. No edema.  Respiratory:  Lungs are clear to auscultation bilaterally with normal work of breathing.  GI: Soft and nontender.  MS: No deformity or atrophy. Gait and ROM intact.  Skin: Warm and dry. Color is normal.  Neuro:  Strength and sensation are intact and no gross focal deficits noted.  Psych: Alert, appropriate and with normal affect.   LABORATORY DATA:  EKG:  EKG is not ordered today.  Lab Results  Component Value Date   WBC 9.1 02/09/2018   HGB 11.7 (L) 02/09/2018   HCT 36.2 02/09/2018   PLT 209 02/09/2018   GLUCOSE 92 02/16/2018   CHOL 165 08/15/2017   TRIG 85 08/15/2017   HDL 43 08/15/2017   LDLCALC 105 (H) 08/15/2017   ALT 26 02/08/2018   AST 56 (H) 02/08/2018   NA 141 02/16/2018   K 4.5 02/16/2018   CL 98 02/16/2018   CREATININE 0.69 02/16/2018   BUN 26 02/16/2018   CO2 25 02/16/2018   TSH 3.01 08/29/2015   HGBA1C 7.7 (H)  02/09/2018   MICROALBUR 41.1 05/10/2016     BNP (last 3 results) Recent Labs    02/08/18 0446  BNP 743.8*    ProBNP (last 3 results) No results for input(s): PROBNP in the last 8760 hours.   Other Studies Reviewed Today:  RIGHT/LEFT HEART CATH AND CORONARY ANGIOGRAPHY 02/2018  Conclusion     There is severe left ventricular systolic dysfunction.  LV end diastolic pressure is normal.  The left ventricular ejection fraction is 25-35% by visual estimate.   1. Mild coronary irregularities. No significant obstructive disease 2. Severe LV dysfunction 3. Normal LV filling pressures  4. Normal right heart pressures  Plan: medical management for CHF.  Recommend Aspirin 81mg  daily for moderate CAD.   Echo Study Conclusions 02/2018  - Left ventricle: The cavity size was at the upper limits of   normal. Wall thickness was increased in a pattern of moderate   LVH. Systolic function was moderately to severely reduced. The   estimated ejection fraction was in the range of 30% to 35%. There   is akinesis of the inferolateral and inferior myocardium. Doppler   parameters are consistent with abnormal left ventricular   relaxation (grade 1 diastolic dysfunction). Doppler parameters   are consistent with high ventricular filling pressure. - Ventricular septum: Septal motion showed abnormal function and   dyssynergy. These changes are consistent with a left bundle   branch block. - Mitral valve: There was mild regurgitation. - Left atrium: The atrium was moderately dilated. - Pulmonary arteries: PA peak pressure: 41 mm Hg (S).   Study Conclusions 2016  - Left ventricle: The cavity size was normal. There was severe   concentric hypertrophy. Systolic function was normal. The   estimated ejection fraction was in the range of 55% to 60%. Wall   motion was normal; there were no regional wall motion   abnormalities. There was an increased relative contribution of   atrial  contraction to ventricular filling. Doppler parameters are   consistent with abnormal left ventricular relaxation (grade 1   diastolic dysfunction). - Aortic valve: Trileaflet; normal thickness, mildly calcified   leaflets. - Left atrium: The atrium was mildly dilated.    Assessment/Plan:  1. Recent admission for acute systolic with acute on chronic diastolic HF - she seems to understand the need for salt restriction. BP is soft - I suspect this is why she is dizzy. She is pretty adamant that she is not double/triple dosing herself - we have taken away all extra med bottles and have hopefully reconciled. I am cutting Coreg in half today. Hope to resume ACE on return but at lower doe. Would hold on starting Imdur for now. Lab today. Compliance is going to be key as well as challenging.   2. HTN - has had Coreg added to her regimen - BP pretty soft and she is symptomatic - cutting in half today. Her medicines are quite unclear - did not match up - multiple bottles of the same agent noted. Have tried to correct this.   3. DM - per PCP  4. HLD - back on statin - apparently not able to tolerate in the past (this is noted in the record) - but now taking - not discussed today.   Current medicines are reviewed with the patient today.  The patient does not have concerns regarding medicines other than what has been noted above.  The following changes have been made:  See above.  Labs/ tests ordered today include:    Orders Placed This Encounter  Procedures  . Basic metabolic panel  . CBC  . Pro b natriuretic peptide (BNP)     Disposition:   FU with me next week.   Patient is agreeable to this plan and will call if any problems develop in the interim.   SignedNorma Fredrickson, NP  02/27/2018 10:41 AM  [facility] [address] [place], [place]  [address] Phone: [phone] Fax: [phone]

## 2018-02-27 NOTE — Patient Instructions (Signed)
We will be checking the following labs today - BMET, CBC and BNP   Medication Instructions:    Continue with your current medicines. BUT  I am cutting the Coreg back to just 1/2 a tablet twice a day   If you need a refill on your cardiac medications before your next appointment, please call your pharmacy.     Testing/Procedures To Be Arranged:  N/A  Follow-Up:   See me in about a week - bring all your bottles of medicines back with you.     At [facility], you and your health needs are our priority.  As part of our continuing mission to provide you with exceptional heart care, we have created designated Provider Care Teams.  These Care Teams include your primary Cardiologist (physician) and Advanced Practice Providers (APPs -  Physician Assistants and Nurse Practitioners) who all work together to provide you with the care you need, when you need it.  Special Instructions:  . None  Call the [facility] office at [phone] if you have any questions, problems or concerns.

## 2018-03-01 ENCOUNTER — Other Ambulatory Visit: Payer: Self-pay | Admitting: Physician Assistant

## 2018-03-01 MED FILL — ?METFORMIN HCL 1000MG TABS: 1000 | 30 days supply | Qty: 60 | Fill #4

## 2018-03-01 MED FILL — ATORVASTATIN 80 MG TABLET: 80 | 30 days supply | Qty: 30 | Fill #3

## 2018-03-01 MED FILL — LISINOPRIL 40 MG TABLET: 40 | 30 days supply | Qty: 30 | Fill #3

## 2018-03-01 MED FILL — GABAPENTIN 300 MG CAPSULE: 300 | 30 days supply | Qty: 180 | Fill #5

## 2018-03-06 MED FILL — TRUEplus LANCETS 28G MISC: 25 days supply | Qty: 100 | Fill #1

## 2018-03-07 ENCOUNTER — Ambulatory Visit (INDEPENDENT_AMBULATORY_CARE_PROVIDER_SITE_OTHER): Payer: Self-pay | Admitting: Nurse Practitioner

## 2018-03-07 ENCOUNTER — Encounter: Payer: Self-pay | Admitting: Nurse Practitioner

## 2018-03-07 VITALS — BP 128/80 | HR 66 | Wt 167.8 lb

## 2018-03-07 DIAGNOSIS — E1165 Type 2 diabetes mellitus with hyperglycemia: Secondary | ICD-10-CM

## 2018-03-07 DIAGNOSIS — I428 Other cardiomyopathies: Secondary | ICD-10-CM

## 2018-03-07 DIAGNOSIS — I10 Essential (primary) hypertension: Secondary | ICD-10-CM

## 2018-03-07 LAB — BASIC METABOLIC PANEL
BUN/Creatinine Ratio: 16 (ref 12–28)
BUN: 13 mg/dL (ref 8–27)
CO2: 27 mmol/L (ref 20–29)
Calcium: 9.9 mg/dL (ref 8.7–10.3)
Chloride: 95 mmol/L — ABNORMAL LOW (ref 96–106)
Creatinine, Ser: 0.8 mg/dL (ref 0.57–1.00)
GFR calc Af Amer: 88 mL/min/{1.73_m2} (ref >59)
GFR calc non Af Amer: 77 mL/min/{1.73_m2} (ref >59)
Glucose: 122 mg/dL — ABNORMAL HIGH (ref 65–99)
Potassium: 4 mmol/L (ref 3.5–5.2)
Sodium: 142 mmol/L (ref 134–144)

## 2018-03-07 MED ORDER — LISINOPRIL 40 MG PO TABS: 40.0000 mg | ORAL_TABLET | Freq: Every day | ORAL | 3 refills | Status: AC

## 2018-03-07 MED ORDER — CLONIDINE HCL 0.2 MG PO TABS: 0.2000 mg | ORAL_TABLET | Freq: Three times a day (TID) | ORAL | 3 refills | Status: AC

## 2018-03-07 MED ORDER — ATORVASTATIN CALCIUM 80 MG PO TABS: 80.0000 mg | ORAL_TABLET | Freq: Every day | ORAL | 3 refills | Status: AC

## 2018-03-07 MED ORDER — CARVEDILOL 12.5 MG PO TABS: 6.2500 mg | ORAL_TABLET | Freq: Two times a day (BID) | ORAL | 3 refills | Status: AC

## 2018-03-07 MED ORDER — FUROSEMIDE 40 MG PO TABS: 40.0000 mg | ORAL_TABLET | Freq: Two times a day (BID) | ORAL | 3 refills | Status: AC

## 2018-03-07 NOTE — Patient Instructions (Addendum)
We will be checking the following labs today - BMET  If you have labs (blood work) drawn today and your tests are completely normal, you will receive your results only by: Marland Kitchen MyChart Message (if you have MyChart) OR . A paper copy in the mail If you have any lab test that is abnormal or we need to change your treatment, we will call you to review the results.   Medication Instructions:    Continue with your current medicines. I have sent your heart medicines to [facility]   If you need a refill on your cardiac medications before your next appointment, please call your pharmacy.     Testing/Procedures To Be Arranged:  N/A  Follow-Up:   See me in 2 months    At [facility], you and your health needs are our priority.  As part of our continuing mission to provide you with exceptional heart care, we have created designated Provider Care Teams.  These Care Teams include your primary Cardiologist (physician) and Advanced Practice Providers (APPs -  Physician Assistants and Nurse Practitioners) who all work together to provide you with the care you need, when you need it.  Special Instructions:  . None  Call the [facility] office at [phone] if you have any questions, problems or concerns.

## 2018-03-07 NOTE — Progress Notes (Signed)
CARDIOLOGY OFFICE NOTE  Date:  03/07/2018    Carol Finley Date of Birth: 08/31/1950 Medical Record #[id number]  PCP:  Hoy Register, MD  Cardiologist:  Reita Chard  Chief Complaint  Patient presents with  . Congestive Heart Failure  . Hypertension    1 week check. Seen for Dr. Mayford Knife    History of Present Illness: Carol Finley is a 67 y.o. female who presents today for a one week check. Seen for Dr. Mayford Knife. Previously followed by Carol Newcomer, PA.   She has a history of chronic diastolic CHF, HTN, & HLD.   Admitted back in November with abrupt onset of dyspnea while sleeping. EMS was called. Required Bipap. The record noted that she follows closely with her PCP but had not seen a cardiologist since an admission in 2016 for chest pain (seen by Dr. Mayford Knife at that time). She had a NST at that time which was felt to have diaphragmatic and breast attenuation but negative for inducible ischemia. Echo in 2016 with EF 55-60%, no wall motion abnormalities, and G1DD.  At this last admission - she was markedly hypertensive, tachycardic/tachpneic. She was treated with IV lasix and started on NTG gtt. Echo noted new reduction in her EF - she underwent cardiac cath - nonobstructive CAD noted but severe systolic dysfunction noted - she is to be managed medically.   I then saw her about 10 days ago - her medicines were all mixed up. Multiple bottles of varying dosages. Tried to simplify. She did not have a bottle of Imdur or Lisinopril - these were taken off her list. She told me she was not taking these agents. She was dizzy with lower BP. I cut her dose of Coreg back.   Comes in today. Here alone. The interpreter has joined [place]. Today she has a bottle of Lisinopril in her bag - says she is taking this - did not have at last visit. BP is ok. She says she feels good. Not dizzy anymore. Breathing is fine. No swelling. Seems to be using less salt. Seems to  be taking her medicine.  From our conversation it was learned that when her bottles are empty - she throws them away.   Past Medical History:  Diagnosis Date  . BACTERIAL PNEUMONIA 10/09/2009   Qualifier: Diagnosis of  By: Huntley Dec, Scott    . CHF (congestive heart failure) (HCC)    grade 1 diastolic, 2016  . Diabetes mellitus without complication (HCC)   . Hypertension     Past Surgical History:  Procedure Laterality Date  . RIGHT/LEFT HEART CATH AND CORONARY ANGIOGRAPHY N/A 02/10/2018   Procedure: RIGHT/LEFT HEART CATH AND CORONARY ANGIOGRAPHY;  Surgeon: Swaziland, Peter M, MD;  Location: [facility] INVASIVE CV LAB;  Service: Cardiovascular;  Laterality: N/A;     Medications: Current Meds  Medication Sig  . aspirin 81 MG tablet Take 1 tablet (81 mg total) by mouth daily.  Marland Kitchen atorvastatin (LIPITOR) 80 MG tablet Take 1 tablet (80 mg total) by mouth daily.  . carvedilol (COREG) 12.5 MG tablet Take 0.5 tablets (6.25 mg total) by mouth 2 (two) times daily with a meal.  . cloNIDine (CATAPRES) 0.2 MG tablet Take 1 tablet (0.2 mg total) by mouth 3 (three) times daily.  . ferrous sulfate 325 (65 FE) MG tablet Take 1 tablet (325 mg total) by mouth daily with breakfast.  . furosemide (LASIX) 40 MG tablet Take 1 tablet (40 mg total) by mouth 2 (two) times  daily.  . gabapentin (NEURONTIN) 300 MG capsule Take 2 capsules (600 mg total) by mouth 3 (three) times daily.  . insulin NPH-regular Human (70-30) 100 UNIT/ML injection Inject 22 Units into the skin 2 (two) times daily with a meal.  . Insulin Syringe-Needle U-100 (BD INSULIN SYRINGE ULTRAFINE) 31G X 15/64" 0.5 ML MISC Use as directed  . lisinopril (PRINIVIL,ZESTRIL) 40 MG tablet Take 1 tablet (40 mg total) by mouth daily.  . metFORMIN (GLUCOPHAGE) 1000 MG tablet Take 1 tablet (1,000 mg total) by mouth 2 (two) times daily with a meal.  . VENTOLIN HFA 108 (90 Base) MCG/ACT inhaler INHALE 2 PUFFS INTO THE LUNGS EVERY SIX HOURS AS NEEDED FOR WHEEZING OR  SHORTNESS OF BREATH. (Patient taking differently: Inhale 2 puffs into the lungs every 6 (six) hours as needed. )  . [DISCONTINUED] atorvastatin (LIPITOR) 80 MG tablet Take 1 tablet (80 mg total) by mouth daily.  . [DISCONTINUED] carvedilol (COREG) 12.5 MG tablet Take 0.5 tablets (6.25 mg total) by mouth 2 (two) times daily with a meal.  . [DISCONTINUED] cloNIDine (CATAPRES) 0.2 MG tablet Take 1 tablet (0.2 mg total) by mouth 3 (three) times daily.  . [DISCONTINUED] furosemide (LASIX) 40 MG tablet Take 1 tablet (40 mg total) by mouth 2 (two) times daily.  . [DISCONTINUED] lisinopril (PRINIVIL,ZESTRIL) 40 MG tablet Take 40 mg by mouth daily.      Allergies: No Known Allergies  Social History: The patient  reports that she has never smoked. She has never used smokeless tobacco. She reports that she does not drink alcohol or use drugs.   Family History: The patient's family history includes Diabetes in her mother; Heart disease in her mother; Hypertension in her father and mother.   Review of Systems: Please see the history of present illness.   Otherwise, the review of systems is positive for none.   All other systems are reviewed and negative.   Physical Exam: VS:  BP 128/80   Pulse 66   Wt 167 lb 12.8 oz (76.1 kg)   SpO2 96% Comment: at rest  BMI 29.72 kg/m  .  BMI Body mass index is 29.72 kg/m.  Wt Readings from Last 3 Encounters:  03/07/18 167 lb 12.8 oz (76.1 kg)  02/27/18 166 lb 9.6 oz (75.6 kg)  02/16/18 166 lb 9.6 oz (75.6 kg)    General: Pleasant. Alert and in no acute distress. Minimal English.   HEENT: Normal.  Neck: Supple, no JVD, carotid bruits, or masses noted.  Cardiac: Regular rate and rhythm. No murmurs, rubs, or gallops. No edema.  Respiratory:  Lungs are clear to auscultation bilaterally with normal work of breathing.  GI: Soft and nontender.  MS: No deformity or atrophy. Gait and ROM intact.  Skin: Warm and dry. Color is normal.  Neuro:  Strength and  sensation are intact and no gross focal deficits noted.  Psych: Alert, appropriate and with normal affect.   LABORATORY DATA:  EKG:  EKG is not ordered today.  Lab Results  Component Value Date   WBC 6.4 02/27/2018   HGB 12.9 02/27/2018   HCT 39.7 02/27/2018   PLT 278 02/27/2018   GLUCOSE 178 (H) 02/27/2018   CHOL 165 08/15/2017   TRIG 85 08/15/2017   HDL 43 08/15/2017   LDLCALC 105 (H) 08/15/2017   ALT 26 02/08/2018   AST 56 (H) 02/08/2018   NA 137 02/27/2018   K 4.2 02/27/2018   CL 94 (L) 02/27/2018   CREATININE 1.07 (H)  02/27/2018   BUN 25 02/27/2018   CO2 24 02/27/2018   TSH 3.01 08/29/2015   HGBA1C 7.7 (H) 02/09/2018   MICROALBUR 41.1 05/10/2016     BNP (last 3 results) Recent Labs    02/08/18 0446  BNP 743.8*    ProBNP (last 3 results) Recent Labs    02/27/18 1040  PROBNP 2,176*     Other Studies Reviewed Today:  RIGHT/LEFT HEART CATH AND CORONARY ANGIOGRAPHY 02/2018  Conclusion     There is severe left ventricular systolic dysfunction.  LV end diastolic pressure is normal.  The left ventricular ejection fraction is 25-35% by visual estimate.  1. Mild coronary irregularities. No significant obstructive disease 2. Severe LV dysfunction 3. Normal LV filling pressures 4. Normal right heart pressures  Plan: medical management for CHF.  Recommend Aspirin 81mg  daily for moderate CAD.   Echo Study Conclusions 02/2018  - Left ventricle: The cavity size was at the upper limits of normal. Wall thickness was increased in a pattern of moderate LVH. Systolic function was moderately to severely reduced. The estimated ejection fraction was in the range of 30% to 35%. There is akinesis of the inferolateral and inferior myocardium. Doppler parameters are consistent with abnormal left ventricular relaxation (grade 1 diastolic dysfunction). Doppler parameters are consistent with high ventricular filling pressure. - Ventricular  septum: Septal motion showed abnormal function and dyssynergy. These changes are consistent with a left bundle branch block. - Mitral valve: There was mild regurgitation. - Left atrium: The atrium was moderately dilated. - Pulmonary arteries: PA peak pressure: 41 mm Hg (S).   Study Conclusions 2016  - Left ventricle: The cavity size was normal. There was severe concentric hypertrophy. Systolic function was normal. The estimated ejection fraction was in the range of 55% to 60%. Wall motion was normal; there were no regional wall motion abnormalities. There was an increased relative contribution of atrial contraction to ventricular filling. Doppler parameters are consistent with abnormal left ventricular relaxation (grade 1 diastolic dysfunction). - Aortic valve: Trileaflet; normal thickness, mildly calcified leaflets. - Left atrium: The atrium was mildly dilated.    Assessment/Plan:  1. Recent admission for acute systolic with acute on chronic diastolic HF with reduced LV function - medication compliance seems to be the crux of the issue. Informed several times that bottles need to be refilled when empty and NOT thrown away. Recheck BMET today. May consider repeat limited echo in a few months.   2. HTN - BP is ok. She has her current regimen with her - she is to bring at all visits.   3. DM - per PCP  4. HLD - back on statin - apparently not able to tolerate in the past (this is noted in the record) - but now taking - no issues noted.   5. Non obstructive CAD   Current medicines are reviewed with the patient today.  The patient does not have concerns regarding medicines other than what has been noted above.  The following changes have been made:  See above.  Labs/ tests ordered today include:    Orders Placed This Encounter  Procedures  . Basic metabolic panel     Disposition:   FU with me in 2 months.    Patient is agreeable to this  plan and will call if any problems develop in the interim.   SignedNorma Fredrickson, NP  03/07/2018 10:33 AM  [facility] [address] [place], [place]  [address]  Phone: [phone] Fax: [phone]

## 2018-03-17 MED FILL — !NOVOLIN 70/30 100 UNITS/ML: (70-30) 100 | 22 days supply | Qty: 10 | Fill #1

## 2018-03-18 ENCOUNTER — Emergency Department (HOSPITAL_COMMUNITY): Payer: Medicaid Other

## 2018-03-18 ENCOUNTER — Inpatient Hospital Stay (HOSPITAL_COMMUNITY): Payer: Medicaid Other

## 2018-03-18 ENCOUNTER — Inpatient Hospital Stay (HOSPITAL_COMMUNITY)
Admission: EM | Admit: 2018-03-18 | Discharge: 2018-04-05 | DRG: 308 | Disposition: E | Payer: Medicaid Other | Attending: Pulmonary Disease | Admitting: Pulmonary Disease

## 2018-03-18 ENCOUNTER — Other Ambulatory Visit (HOSPITAL_COMMUNITY): Payer: Self-pay

## 2018-03-18 DIAGNOSIS — R570 Cardiogenic shock: Secondary | ICD-10-CM | POA: Diagnosis not present

## 2018-03-18 DIAGNOSIS — I469 Cardiac arrest, cause unspecified: Secondary | ICD-10-CM

## 2018-03-18 DIAGNOSIS — I4510 Unspecified right bundle-branch block: Secondary | ICD-10-CM | POA: Diagnosis present

## 2018-03-18 DIAGNOSIS — E1165 Type 2 diabetes mellitus with hyperglycemia: Secondary | ICD-10-CM | POA: Diagnosis not present

## 2018-03-18 DIAGNOSIS — I5032 Chronic diastolic (congestive) heart failure: Secondary | ICD-10-CM | POA: Diagnosis present

## 2018-03-18 DIAGNOSIS — E872 Acidosis: Secondary | ICD-10-CM | POA: Diagnosis present

## 2018-03-18 DIAGNOSIS — J309 Allergic rhinitis, unspecified: Secondary | ICD-10-CM | POA: Diagnosis present

## 2018-03-18 DIAGNOSIS — R001 Bradycardia, unspecified: Secondary | ICD-10-CM | POA: Diagnosis not present

## 2018-03-18 DIAGNOSIS — I110 Hypertensive heart disease with heart failure: Secondary | ICD-10-CM | POA: Diagnosis not present

## 2018-03-18 DIAGNOSIS — Z7982 Long term (current) use of aspirin: Secondary | ICD-10-CM | POA: Diagnosis not present

## 2018-03-18 DIAGNOSIS — Z794 Long term (current) use of insulin: Secondary | ICD-10-CM

## 2018-03-18 DIAGNOSIS — Z833 Family history of diabetes mellitus: Secondary | ICD-10-CM | POA: Diagnosis not present

## 2018-03-18 DIAGNOSIS — I4721 Torsades de pointes: Secondary | ICD-10-CM

## 2018-03-18 DIAGNOSIS — Z8249 Family history of ischemic heart disease and other diseases of the circulatory system: Secondary | ICD-10-CM | POA: Diagnosis not present

## 2018-03-18 DIAGNOSIS — I255 Ischemic cardiomyopathy: Secondary | ICD-10-CM | POA: Diagnosis not present

## 2018-03-18 DIAGNOSIS — J9601 Acute respiratory failure with hypoxia: Secondary | ICD-10-CM | POA: Diagnosis not present

## 2018-03-18 DIAGNOSIS — D72829 Elevated white blood cell count, unspecified: Secondary | ICD-10-CM | POA: Diagnosis present

## 2018-03-18 DIAGNOSIS — Z79899 Other long term (current) drug therapy: Secondary | ICD-10-CM | POA: Diagnosis not present

## 2018-03-18 DIAGNOSIS — I4901 Ventricular fibrillation: Principal | ICD-10-CM

## 2018-03-18 DIAGNOSIS — R4020 Unspecified coma: Secondary | ICD-10-CM | POA: Diagnosis present

## 2018-03-18 DIAGNOSIS — I452 Bifascicular block: Secondary | ICD-10-CM | POA: Diagnosis present

## 2018-03-18 DIAGNOSIS — I472 Ventricular tachycardia: Secondary | ICD-10-CM | POA: Diagnosis not present

## 2018-03-18 DIAGNOSIS — E1140 Type 2 diabetes mellitus with diabetic neuropathy, unspecified: Secondary | ICD-10-CM | POA: Diagnosis present

## 2018-03-18 DIAGNOSIS — J939 Pneumothorax, unspecified: Secondary | ICD-10-CM | POA: Diagnosis present

## 2018-03-18 DIAGNOSIS — Z66 Do not resuscitate: Secondary | ICD-10-CM | POA: Diagnosis present

## 2018-03-18 LAB — POCT I-STAT, CHEM 8
BUN: 28 mg/dL — ABNORMAL HIGH (ref 8–23)
BUN: 30 mg/dL — AB (ref 8–23)
CREATININE: 1.2 mg/dL — AB (ref 0.44–1.00)
Calcium, Ion: 0.96 mmol/L — ABNORMAL LOW (ref 1.15–1.40)
Calcium, Ion: 0.96 mmol/L — ABNORMAL LOW (ref 1.15–1.40)
Chloride: 93 mmol/L — ABNORMAL LOW (ref 98–111)
Chloride: 98 mmol/L (ref 98–111)
Creatinine, Ser: 1.1 mg/dL — ABNORMAL HIGH (ref 0.44–1.00)
GLUCOSE: 662 mg/dL — AB (ref 70–99)
Glucose, Bld: 687 mg/dL (ref 70–99)
HCT: 36 % (ref 36.0–46.0)
HCT: 33 % — ABNORMAL LOW (ref 36.0–46.0)
Hemoglobin: 12.2 g/dL (ref 12.0–15.0)
Hemoglobin: 11.2 g/dL — ABNORMAL LOW (ref 12.0–15.0)
Potassium: 5.3 mmol/L — ABNORMAL HIGH (ref 3.5–5.1)
Potassium: 4.7 mmol/L (ref 3.5–5.1)
Sodium: 127 mmol/L — ABNORMAL LOW (ref 135–145)
Sodium: 127 mmol/L — ABNORMAL LOW (ref 135–145)
TCO2: 23 mmol/L (ref 22–32)
TCO2: 17 mmol/L — ABNORMAL LOW (ref 22–32)

## 2018-03-18 LAB — BASIC METABOLIC PANEL
Anion gap: 23 — ABNORMAL HIGH (ref 5–15)
Anion gap: 23 — ABNORMAL HIGH (ref 5–15)
BUN: 19 mg/dL (ref 8–23)
BUN: 21 mg/dL (ref 8–23)
CALCIUM: 7.2 mg/dL — AB (ref 8.9–10.3)
CO2: 20 mmol/L — ABNORMAL LOW (ref 22–32)
CO2: 15 mmol/L — AB (ref 22–32)
Calcium: 7.5 mg/dL — ABNORMAL LOW (ref 8.9–10.3)
Chloride: 100 mmol/L (ref 98–111)
Chloride: 97 mmol/L — ABNORMAL LOW (ref 98–111)
Creatinine, Ser: 0.98 mg/dL (ref 0.44–1.00)
Creatinine, Ser: 1.24 mg/dL — ABNORMAL HIGH (ref 0.44–1.00)
GFR calc Af Amer: >60 mL/min (ref >60)
GFR calc Af Amer: 52 mL/min — ABNORMAL LOW (ref >60)
GFR calc non Af Amer: 60 mL/min — ABNORMAL LOW (ref >60)
GFR calc non Af Amer: 45 mL/min — ABNORMAL LOW (ref >60)
Glucose, Bld: 447 mg/dL — ABNORMAL HIGH (ref 70–99)
Glucose, Bld: 592 mg/dL (ref 70–99)
Potassium: 4 mmol/L (ref 3.5–5.1)
Potassium: 5.1 mmol/L (ref 3.5–5.1)
Sodium: 143 mmol/L (ref 135–145)
Sodium: 135 mmol/L (ref 135–145)

## 2018-03-18 LAB — CBC
HCT: 42.3 % (ref 36.0–46.0)
HCT: 38.5 % (ref 36.0–46.0)
HCT: 30.1 % — ABNORMAL LOW (ref 36.0–46.0)
HEMOGLOBIN: 11.1 g/dL — AB (ref 12.0–15.0)
Hemoglobin: 12.3 g/dL (ref 12.0–15.0)
Hemoglobin: 8.8 g/dL — ABNORMAL LOW (ref 12.0–15.0)
MCH: 28.2 pg (ref 26.0–34.0)
MCH: 28.2 pg (ref 26.0–34.0)
MCH: 28.6 pg (ref 26.0–34.0)
MCHC: 29.1 g/dL — ABNORMAL LOW (ref 30.0–36.0)
MCHC: 28.8 g/dL — ABNORMAL LOW (ref 30.0–36.0)
MCHC: 29.2 g/dL — AB (ref 30.0–36.0)
MCV: 97 fL (ref 80.0–100.0)
MCV: 97.7 fL (ref 80.0–100.0)
MCV: 97.7 fL (ref 80.0–100.0)
NRBC: 0.1 % (ref 0.0–0.2)
PLATELETS: 107 10*3/uL — AB (ref 150–400)
Platelets: 141 10*3/uL — ABNORMAL LOW (ref 150–400)
Platelets: 91 10*3/uL — ABNORMAL LOW (ref 150–400)
RBC: 4.36 MIL/uL (ref 3.87–5.11)
RBC: 3.94 MIL/uL (ref 3.87–5.11)
RBC: 3.08 MIL/uL — ABNORMAL LOW (ref 3.87–5.11)
RDW: 15.3 % (ref 11.5–15.5)
RDW: 15.5 % (ref 11.5–15.5)
RDW: 15.6 % — ABNORMAL HIGH (ref 11.5–15.5)
WBC: 16.2 10*3/uL — ABNORMAL HIGH (ref 4.0–10.5)
WBC: 15 10*3/uL — ABNORMAL HIGH (ref 4.0–10.5)
WBC: 15.1 10*3/uL — ABNORMAL HIGH (ref 4.0–10.5)
nRBC: 1.2 % — ABNORMAL HIGH (ref 0.0–0.2)
nRBC: 0.6 % — ABNORMAL HIGH (ref 0.0–0.2)

## 2018-03-18 LAB — MAGNESIUM
Magnesium: 3.1 mg/dL — ABNORMAL HIGH (ref 1.7–2.4)
Magnesium: 2.7 mg/dL — ABNORMAL HIGH (ref 1.7–2.4)

## 2018-03-18 LAB — I-STAT CHEM 8, ED
BUN: 25 mg/dL — ABNORMAL HIGH (ref 8–23)
Calcium, Ion: 0.86 mmol/L — CL (ref 1.15–1.40)
Chloride: 101 mmol/L (ref 98–111)
Creatinine, Ser: 0.8 mg/dL (ref 0.44–1.00)
Glucose, Bld: 423 mg/dL — ABNORMAL HIGH (ref 70–99)
HCT: 40 % (ref 36.0–46.0)
Hemoglobin: 13.6 g/dL (ref 12.0–15.0)
Potassium: 3.8 mmol/L (ref 3.5–5.1)
Sodium: 141 mmol/L (ref 135–145)
TCO2: 24 mmol/L (ref 22–32)

## 2018-03-18 LAB — LACTIC ACID, PLASMA
Lactic Acid, Venous: 14.1 mmol/L (ref 0.5–1.9)
Lactic Acid, Venous: 12 mmol/L (ref 0.5–1.9)

## 2018-03-18 LAB — POCT I-STAT 3, ART BLOOD GAS (G3+)
Acid-base deficit: 13 mmol/L — ABNORMAL HIGH (ref 0.0–2.0)
Acid-base deficit: 15 mmol/L — ABNORMAL HIGH (ref 0.0–2.0)
Acid-base deficit: 8 mmol/L — ABNORMAL HIGH (ref 0.0–2.0)
BICARBONATE: 14.7 mmol/L — AB (ref 20.0–28.0)
BICARBONATE: 22.5 mmol/L (ref 20.0–28.0)
Bicarbonate: 17 mmol/L — ABNORMAL LOW (ref 20.0–28.0)
O2 SAT: 99 %
O2 SAT: 97 %
O2 Saturation: 82 %
PCO2 ART: 50.6 mmHg — AB (ref 32.0–48.0)
Patient temperature: 35.5
Patient temperature: 35.3
Patient temperature: 35.1
TCO2: 19 mmol/L — ABNORMAL LOW (ref 22–32)
TCO2: 16 mmol/L — ABNORMAL LOW (ref 22–32)
TCO2: 25 mmol/L (ref 22–32)
pCO2 arterial: 44 mmHg (ref 32.0–48.0)
pCO2 arterial: 67.9 mmHg (ref 32.0–48.0)
pH, Arterial: 7.124 — CL (ref 7.350–7.450)
pH, Arterial: 7.12 — CL (ref 7.350–7.450)
pH, Arterial: 7.116 — CL (ref 7.350–7.450)
pO2, Arterial: 175 mmHg — ABNORMAL HIGH (ref 83.0–108.0)
pO2, Arterial: 112 mmHg — ABNORMAL HIGH (ref 83.0–108.0)
pO2, Arterial: 57 mmHg — ABNORMAL LOW (ref 83.0–108.0)

## 2018-03-18 LAB — CREATININE, SERUM
Creatinine, Ser: 1.41 mg/dL — ABNORMAL HIGH (ref 0.44–1.00)
GFR calc Af Amer: 45 mL/min — ABNORMAL LOW (ref >60)
GFR calc non Af Amer: 38 mL/min — ABNORMAL LOW (ref >60)

## 2018-03-18 LAB — I-STAT TROPONIN, ED: Troponin i, poc: 0.2 ng/mL (ref 0.00–0.08)

## 2018-03-18 LAB — BRAIN NATRIURETIC PEPTIDE: B Natriuretic Peptide: 139 pg/mL — ABNORMAL HIGH (ref 0.0–100.0)

## 2018-03-18 LAB — PROTIME-INR
INR: 2.02
INR: 2.62
INR: 5.51
Prothrombin Time: 22.6 seconds — ABNORMAL HIGH (ref 11.4–15.2)
Prothrombin Time: 27.6 seconds — ABNORMAL HIGH (ref 11.4–15.2)
Prothrombin Time: 49.2 seconds — ABNORMAL HIGH (ref 11.4–15.2)

## 2018-03-18 LAB — PHOSPHORUS: Phosphorus: 8.8 mg/dL — ABNORMAL HIGH (ref 2.5–4.6)

## 2018-03-18 LAB — PROCALCITONIN: Procalcitonin: 0.62 ng/mL

## 2018-03-18 LAB — HEMOGLOBIN A1C
Hgb A1c MFr Bld: 7.9 % — ABNORMAL HIGH (ref 4.8–5.6)
Mean Plasma Glucose: 180.03 mg/dL

## 2018-03-18 LAB — APTT
APTT: 109 s — AB (ref 24–36)
APTT: 158 s — AB (ref 24–36)
aPTT: 104 seconds — ABNORMAL HIGH (ref 24–36)

## 2018-03-18 LAB — MRSA PCR SCREENING: MRSA by PCR: NEGATIVE

## 2018-03-18 LAB — RAPID HIV SCREEN (HIV 1/2 AB+AG)
HIV 1/2 Antibodies: NONREACTIVE
HIV-1 P24 Antigen - HIV24: NONREACTIVE

## 2018-03-18 LAB — CBG MONITORING, ED: Glucose-Capillary: 441 mg/dL — ABNORMAL HIGH (ref 70–99)

## 2018-03-18 LAB — TROPONIN I: Troponin I: 5.86 ng/mL (ref <0.03)

## 2018-03-18 LAB — CORTISOL: Cortisol, Plasma: 23.8 ug/dL

## 2018-03-18 MED ORDER — NOREPINEPHRINE 4 MG/250ML-% IV SOLN
INTRAVENOUS | Status: AC
  Administered 2018-03-18: 60 mg
  Filled 2018-03-18: qty 250

## 2018-03-18 MED ORDER — SODIUM CHLORIDE 0.9 % IV SOLN: INTRAVENOUS | Status: DC

## 2018-03-18 MED ORDER — VASOPRESSIN 20 UNIT/ML IV SOLN
0.0300 [IU]/min | INTRAVENOUS | Status: DC
  Administered 2018-03-18: 0.03 [IU]/min via INTRAVENOUS
  Filled 2018-03-18: qty 2

## 2018-03-18 MED ORDER — INSULIN REGULAR(HUMAN) IN NACL 100-0.9 UT/100ML-% IV SOLN: INTRAVENOUS | Status: DC

## 2018-03-18 MED ORDER — AMIODARONE HCL IN DEXTROSE 360-4.14 MG/200ML-% IV SOLN
60.0000 mg/h | INTRAVENOUS | Status: AC
  Administered 2018-03-18: 60 mg/h via INTRAVENOUS
  Filled 2018-03-18 (×3): qty 200

## 2018-03-18 MED ORDER — NOREPINEPHRINE 16 MG/250ML-% IV SOLN: 0.0000 ug/min | INTRAVENOUS | Status: DC

## 2018-03-18 MED ORDER — NOREPINEPHRINE 4 MG/250ML-% IV SOLN
INTRAVENOUS | Status: AC
  Administered 2018-03-18: 4 mg
  Filled 2018-03-18: qty 250

## 2018-03-18 MED ORDER — SODIUM CHLORIDE 0.9 % IV SOLN
INTRAVENOUS | Status: DC
  Administered 2018-03-18: 75 mL/h via INTRAVENOUS

## 2018-03-18 MED ORDER — NOREPINEPHRINE 4 MG/250ML-% IV SOLN
INTRAVENOUS | Status: AC
  Administered 2018-03-18: 05:00:00
  Filled 2018-03-18: qty 250

## 2018-03-18 MED ORDER — HEPARIN SODIUM (PORCINE) 5000 UNIT/ML IJ SOLN
5000.0000 [IU] | Freq: Three times a day (TID) | INTRAMUSCULAR | Status: DC
  Administered 2018-03-18: 5000 [IU] via SUBCUTANEOUS
  Filled 2018-03-18: qty 1

## 2018-03-18 MED ORDER — EPINEPHRINE PF 1 MG/10ML IJ SOSY
PREFILLED_SYRINGE | INTRAMUSCULAR | Status: AC | PRN
  Administered 2018-03-18: 1 via INTRAVENOUS

## 2018-03-18 MED ORDER — INSULIN REGULAR(HUMAN) IN NACL 100-0.9 UT/100ML-% IV SOLN
INTRAVENOUS | Status: DC
  Administered 2018-03-18: 10.8 [IU]/h via INTRAVENOUS
  Filled 2018-03-18: qty 100

## 2018-03-18 MED ORDER — NOREPINEPHRINE 4 MG/250ML-% IV SOLN
INTRAVENOUS | Status: AC
  Filled 2018-03-18: qty 250

## 2018-03-18 MED ORDER — AMIODARONE HCL IN DEXTROSE 360-4.14 MG/200ML-% IV SOLN: 30.0000 mg/h | INTRAVENOUS | Status: DC

## 2018-03-18 MED ORDER — ALBUTEROL SULFATE (2.5 MG/3ML) 0.083% IN NEBU
2.5000 mg | INHALATION_SOLUTION | RESPIRATORY_TRACT | Status: DC
  Administered 2018-03-18: 2.5 mg via RESPIRATORY_TRACT
  Filled 2018-03-18 (×2): qty 3

## 2018-03-18 MED ORDER — ATROPINE SULFATE 1 MG/ML IJ SOLN
INTRAMUSCULAR | Status: AC | PRN
  Administered 2018-03-18: 1 mg via INTRAVENOUS

## 2018-03-18 MED ORDER — NOREPINEPHRINE 4 MG/250ML-% IV SOLN
INTRAVENOUS | Status: AC
  Administered 2018-03-18: 10 ug/min
  Filled 2018-03-18: qty 250

## 2018-03-18 MED ORDER — FAMOTIDINE IN NACL 20-0.9 MG/50ML-% IV SOLN
20.0000 mg | Freq: Two times a day (BID) | INTRAVENOUS | Status: DC
  Administered 2018-03-18: 20 mg via INTRAVENOUS
  Filled 2018-03-18: qty 50

## 2018-03-18 MED ORDER — INSULIN ASPART 100 UNIT/ML ~~LOC~~ SOLN: 0.0000 [IU] | SUBCUTANEOUS | Status: DC

## 2018-03-18 MED ORDER — SODIUM CHLORIDE 0.9 % IV SOLN
INTRAVENOUS | Status: AC | PRN
  Administered 2018-03-18: 1000 mL via INTRAVENOUS

## 2018-03-18 MED ORDER — FENTANYL 2500MCG IN NS 250ML (10MCG/ML) PREMIX INFUSION
0.0000 ug/h | INTRAVENOUS | Status: DC
  Administered 2018-03-18: 50 ug/h via INTRAVENOUS
  Filled 2018-03-18: qty 250

## 2018-03-18 MED ORDER — SODIUM BICARBONATE 8.4 % IV SOLN
INTRAVENOUS | Status: AC | PRN
  Administered 2018-03-18: 50 meq via INTRAVENOUS

## 2018-03-18 MED ORDER — MIDAZOLAM HCL 2 MG/2ML IJ SOLN: 2.0000 mg | INTRAMUSCULAR | Status: DC | PRN

## 2018-03-18 MED ORDER — NOREPINEPHRINE 4 MG/250ML-% IV SOLN
INTRAVENOUS | Status: AC
  Administered 2018-03-18: 50 ug/min
  Filled 2018-03-18: qty 250

## 2018-03-18 MED ORDER — NOREPINEPHRINE 4 MG/250ML-% IV SOLN: 0.0000 ug/min | Freq: Once | INTRAVENOUS | Status: DC

## 2018-03-19 LAB — HEPATITIS PANEL, ACUTE
HCV Ab: <0.1 s/co ratio (ref 0.0–0.9)
Hep A IgM: NEGATIVE
Hep B C IgM: NEGATIVE
Hepatitis B Surface Ag: NEGATIVE

## 2018-03-20 ENCOUNTER — Telehealth: Payer: Self-pay | Admitting: *Deleted

## 2018-03-20 MED FILL — Medication: Qty: 1 | Status: AC

## 2018-03-20 NOTE — Telephone Encounter (Signed)
Received D/C from [facility] -D/C forwarded to Dr.Agarwala to be signed

## 2018-03-22 NOTE — Telephone Encounter (Signed)
Received D/C-[facility] Notified for pick up

## 2018-03-23 LAB — CULTURE, BLOOD (ROUTINE X 2)
Culture: NO GROWTH
Culture: NO GROWTH
Special Requests: ADEQUATE

## 2018-03-27 MED FILL — ?METFORMIN HCL 1,000 MG TAB: 1000 | 30 days supply | Qty: 60 | Fill #5

## 2018-03-27 MED FILL — TRUEplus LANCETS 28G MISC: 25 days supply | Qty: 100 | Fill #2

## 2018-03-27 MED FILL — ATORVASTATIN 80 MG TABLET: 80 | 30 days supply | Qty: 30 | Fill #0

## 2018-03-27 MED FILL — LISINOPRIL 40 MG TABLET: 40 | 30 days supply | Qty: 30 | Fill #4

## 2018-03-27 MED FILL — ?CARVEDILOL 12.5 MG TABLET: 12.5 | 30 days supply | Qty: 15 | Fill #0

## 2018-03-27 MED FILL — GABAPENTIN 300 MG CAPSULE: 300 | 30 days supply | Qty: 180 | Fill #6

## 2018-03-27 MED FILL — ?FUROSEMIDE 40 MG TABLET: 40 | 30 days supply | Qty: 60 | Fill #0

## 2018-03-27 MED FILL — ?CLONIDINE HCL 0.2 MG TABLE: 0.2 MG | 30 days supply | Qty: 90 | Fill #0

## 2018-04-05 NOTE — ED Notes (Signed)
ICE PACKS placed at this time

## 2018-04-05 NOTE — Significant Event (Signed)
Critical care attending note:  Called to bedside for cardiac arrest.  68 year old woman who suffered a VF cardiac arrest at home with a prolonged downtime.  Known severe LV dysfunction with no significant obstructive disease.  Repeat cardiac arrest this morning.  On my arrival, CPR was in progress.  At rhythm check, patient was in the VF and was shocked to sinus bradycardia.  Given epinephrine and bicarbonate to sustain blood pressure.  Ventricular fibrillation recurred again.  Patient was shocked once more and loaded with amiodarone.  ROSC was achieved. However, patient remained obtunded and mechanically ventilated with copious frothy secretions and increasing oxygen requirements.  He remained critically ill due to cardiogenic shock requiring high-dose infusions of norepinephrine and vasopressin.  In spite of this her cardiac rhythm began to further degenerate into bradycardia with decreasing blood pressure.  I I then spoke to the family indicating the dismal prognosis given a failing heart at baseline and the prospect of poor neurological recovery after 2 cardiac arrests.  They agreed that the compassionate thing would be to forego further CPR to avoid further suffering.  A DNR no further escalation of care order was entered the family and chaplain were then allowed into the room.  She became asystolic at [phone] and was pronounced dead at that time.  I have provided 30 minutes of critical care time including managing the cardiac arrest discussing CODE STATUS with family.  Lynnell Catalan, MD [facility] ICU Physician [facility]  Pager: [phone] Mobile: [phone] After hours: [phone].  17-Apr-2018, 8:29 AM

## 2018-04-05 NOTE — Progress Notes (Signed)
eLink Physician-Brief Progress Note Patient Name: Carol Finley DOB: 27-Jun-1950 MRN: [id number]   Date of Service  March 24, 2018  HPI/Events of Note  Witnessed cardiac arrest with bystander CPR. Torsades on EMS arrival then v fib. Autocooled maintaining at 36. Sustained right sided pneumothorax likely from CPR, no reported trauma.  eICU Interventions   Repeat CXR pending  Insulin drip started  Vent adjusted to correct acidosis     Intervention Category Major Interventions: Arrhythmia - evaluation and management;Acid-Base disturbance - evaluation and management Intermediate Interventions: Hyperglycemia - evaluation and treatment Evaluation Type: New Patient Evaluation  Darl Pikes 2018/03/24, 5:48 AM

## 2018-04-05 NOTE — Death Summary Note (Signed)
DEATH SUMMARY   Patient Details  Name: Carol Finley MRN: [id number] DOB: 30-May-1950  Admission/Discharge Information   Admit Date:  Apr 05, 2018  Date of Death: Date of Death: 04/05/2018  Time of Death: Time of Death: 0824  Length of Stay: 1  Referring Physician: Hoy Register, MD   Reason(s) for Hospitalization  Cardiac arrest  Diagnoses  Preliminary cause of death:  Secondary Diagnoses (including complications and co-morbidities):  Principal Problem:   Ventricular fibrillation (HCC) Active Problems:   Pneumothorax, right   Torsades de pointes (HCC)   Right bundle branch block (RBBB) with left anterior fascicular block (LAFB)   Leukocytosis   Brief Hospital Course (including significant findings, care, treatment, and services provided and events leading to death)  Carol Finley is a 68 y.o. year old female who had a known non-revascularizable ischemic cardiomyopathy.  She suffered a prolonged cardiac arrest at home due to ventricular fibrillation and subsequent PEA. She made no spontaneous neurological recovery and remained in found and refractory cardiogenic shock despite multiple vasopressor infusions. Despite maximal interventions her heart rate and blood pressure continued to decline. The situation was discussed with her family who were advised that there were no further meaningful interventions that could be performed for her. All resuscitative efforts were then terminated and the patient was allowed to pass away naturally.   Pertinent Labs and Studies  Significant Diagnostic Studies Ct Head Wo Contrast  Result Date: 04/05/2018 CLINICAL DATA:  Cardiac arrest. History of hypertension and diabetes. EXAM: CT HEAD WITHOUT CONTRAST TECHNIQUE: Contiguous axial images were obtained from the base of the skull through the vertex without intravenous contrast. COMPARISON:  None. FINDINGS: BRAIN: No intraparenchymal hemorrhage, mass effect nor midline  shift. The ventricles and sulci are normal for age. Minimal supratentorial white matter hypodensities less than expected for patient's age, though non-specific are most compatible with chronic small vessel ischemic disease. No acute large vascular territory infarcts. No abnormal extra-axial fluid collections. Basal cisterns are patent. VASCULAR: Luminal calcific atherosclerosis of the carotid siphons. SKULL: No skull fracture. No significant scalp soft tissue swelling. SINUSES/ORBITS: Sphenoid sinus air-fluid levels with mild paranasal sinus mucosal thickening. 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 OTHER: Patient is edentulous. IMPRESSION: Normal noncontrast CT HEAD for age. Electronically Signed   By: Awilda Metro M.D.   On: Apr 05, 2018 04:50   Dg Chest Port 1 View  Result Date: April 05, 2018 CLINICAL DATA:  68 year old female with right-sided pneumothorax EXAM: PORTABLE CHEST 1 VIEW COMPARISON:  Prior chest x-ray earlier today at 2:21 a.m. FINDINGS: The endotracheal tube has advanced and is now 1 cm above the carina. Gastric tube in unchanged position overlying the gastric fundus. Perhaps slight interval increase in the size of the right-sided pneumothorax. Pleural displacement from the rib margin measures up to 12 mm today compared to 9 mm previously. Pneumothorax volume remains less than 15%. Persistent and perhaps slightly increased diffuse bilateral interstitial and airspace opacities. No acute osseous abnormality. IMPRESSION: 1. Similar to perhaps minimally enlarged right sided pneumothorax. Pneumothorax volume remains no more than 15%. 2. Low position of the endotracheal tube approximately 1 cm above the carina. 3. Progressive diffuse bilateral interstitial and airspace opacities which may represent a combination of pulmonary edema and multifocal pneumonia versus ARDS. Electronically Signed   By: Malachy Moan M.D.   On:  04/05/18 10:14   Dg Chest Portable 1 View  Result Date: 2018/04/05 CLINICAL DATA:  Reposition tubes. EXAM: PORTABLE CHEST 1 VIEW  COMPARISON:  Radiograph earlier this day at 0137 hour FINDINGS: Endotracheal tube has been retracted, tip now 3.4 cm from the carina. Enteric tube is in place, coursing below the diaphragm, likely coiled in the stomach, tip not included in the field of view. Small right pneumothorax appears similar to prior exam. Small amount subcutaneous emphysema noted about the right chest wall. Cardiomegaly and multifocal pulmonary opacities are again seen. IMPRESSION: 1. Endotracheal tube has been retracted, tip now 3.4 cm from the carina. Enteric tube in place, likely coiled in the stomach, tip not included in the field of view. 2. Right pneumothorax appears similar to prior exam. Subcutaneous emphysema in the right chest wall, new. 3. Unchanged cardiomegaly. Unchanged multifocal pulmonary opacities which may represent pulmonary edema, aspiration, or multifocal pneumonia. Electronically Signed   By: Narda Rutherford M.D.   On: 03/30/18 02:33   Dg Chest Portable 1 View  Result Date: 03/30/2018 CLINICAL DATA:  Post intubation/CPR. EXAM: PORTABLE CHEST 1 VIEW COMPARISON:  02/08/2018 FINDINGS: Endotracheal tube is at the origin of the right mainstem bronchus. Small right pneumothorax. Cardiomegaly is similar to prior. Diffuse lung opacities, greatest in the perihilar region. No large pleural effusion. IMPRESSION: 1. Endotracheal tube at the origin of the right mainstem bronchus. Recommend retraction of 2.8 cm. 2. Small right pneumothorax. 3. Diffuse lung opacities, greatest in the perihilar region, may reflect pulmonary edema or multifocal pneumonia. 4. Cardiomegaly. Critical Value/emergent results were called by telephone at the time of interpretation on 30-Mar-2018 at 2:10 am to Dr. Marily Memos , who verbally acknowledged these results. Electronically Signed   By: Narda Rutherford M.D.    On: 03/30/2018 02:10    Microbiology Recent Results (from the past 240 hour(s))  Culture, blood (routine x 2)     Status: None (Preliminary result)   Collection Time: March 30, 2018  3:25 AM  Result Value Ref Range Status   Specimen Description BLOOD RIGHT HAND  Final   Special Requests   Final    BOTTLES DRAWN AEROBIC AND ANAEROBIC Blood Culture adequate volume   Culture   Final    NO GROWTH 2 DAYS Performed at [facility], [address] [place], [place] [address]    Report Status PENDING  Incomplete  Culture, blood (routine x 2)     Status: None (Preliminary result)   Collection Time: 03/30/2018  3:40 AM  Result Value Ref Range Status   Specimen Description BLOOD RIGHT ARM  Final   Special Requests   Final    BOTTLES DRAWN AEROBIC AND ANAEROBIC Blood Culture results may not be optimal due to an inadequate volume of blood received in culture bottles   Culture   Final    NO GROWTH 2 DAYS Performed at [facility], [address], [place], [place] [address]    Report Status PENDING  Incomplete  MRSA PCR Screening     Status: None   Collection Time: March 30, 2018  5:39 AM  Result Value Ref Range Status   MRSA by PCR NEGATIVE NEGATIVE Final    Comment:        The GeneXpert MRSA Assay (FDA approved for NASAL specimens only), is one component of a comprehensive MRSA colonization surveillance program. It is not intended to diagnose MRSA infection nor to guide or monitor treatment for MRSA infections. Performed at [facility], [address] [place], [place] [address]     Lab Basic Metabolic Panel: Recent Labs  Lab 03/30/2018 0124 30-Mar-2018 [phone] 03-30-2018 0206  2018-04-16 0349 2018-04-16 0529 April 16, 2018 0600 2018/04/16 0652 04-16-2018 0827  NA 141 143  --  135 127*  --  127*  --   K 3.8 4.0  --  5.1 5.3*  --  4.7  --   CL 101 100  --  97* 93*  --  98  --   CO2  --  20*  --  15*  --   --   --   --   GLUCOSE 423* 447*  --  592* 662*  --  687*  --   BUN 25* 19  --  21  28*  --  30*  --   CREATININE 0.80 0.98  --  1.24* 1.10*  --  1.20* 1.41*  CALCIUM  --  7.2*  --  7.5*  --   --   --   --   MG  --   --  3.1*  --   --  2.7*  --   --   PHOS  --   --   --   --   --  8.8*  --   --    Liver Function Tests: No results for input(s): AST, ALT, ALKPHOS, BILITOT, PROT, ALBUMIN in the last 168 hours. No results for input(s): LIPASE, AMYLASE in the last 168 hours. No results for input(s): AMMONIA in the last 168 hours. CBC: Recent Labs  Lab April 16, 2018 0137 04-16-2018 0349 04/16/2018 0529 2018-04-16 0652 2018/04/16 0827  WBC 16.2* 15.0*  --   --  15.1*  HGB 12.3 11.1* 12.2 11.2* 8.8*  HCT 42.3 38.5 36.0 33.0* 30.1*  MCV 97.0 97.7  --   --  97.7  PLT 107* 141*  --   --  91*   Cardiac Enzymes: Recent Labs  Lab 04/16/2018 0600  TROPONINI 5.86*   Sepsis Labs: Recent Labs  Lab 04/16/18 0137 2018-04-16 0206 04-16-18 0349 04/16/2018 0827  PROCALCITON  --   --   --  0.62  WBC 16.2*  --  15.0* 15.1*  LATICACIDVEN  --  14.1* 12.0*  --     Procedures/Operations  Mechanical ventilation and vasopressor infusions.  Carol Finley 03/20/2018, 6:25 PM

## 2018-04-05 NOTE — Progress Notes (Signed)
0715 pt< bp to 30's elink notified code call heart rate in 10's idioventricular rhythm cpr began, see code sheet for further information

## 2018-04-05 NOTE — Code Documentation (Signed)
  Patient Name: Carol Finley   MRN: [id number]   Date of Birth/ Sex: 1950-08-11 , female      Admission Date: 03/20/18  Attending Provider: Marcelle Smiling, MD  Primary Diagnosis: Ventricular fibrillation [facility])   Indication: Pt was in her usual state of health until this AM, when she was noted to be in cardiac arrest. Code blue was subsequently called. At the time of arrival on scene, ACLS protocol was underway.   Technical Description:  - CPR performance duration:   17 minute  - Was defibrillation or cardioversion used?  Yes   - Was external pacer placed?  No  - Was patient intubated pre/post CPR?  No   Medications Administered: Y = Yes; Blank = No Amiodarone  Y  Atropine    Calcium  Y  Epinephrine  Y  Lidocaine    Magnesium  Y  Norepinephrine  Y  Phenylephrine    Sodium bicarbonate  Y  Vasopressin  Y   Post CPR evaluation:  - Final Status - Was patient successfully resuscitated ? Yes - What is current rhythm? Sinus - What is current hemodynamic status? Stable  Miscellaneous Information:  - Labs sent, including: CBC, Creatinine, Procalcitonin, PT-INR, PTT  - Primary team notified?  Yes  - Family Notified? Yes  - Additional notes/ transfer status:  None     Synetta Shadow, MD  20-Mar-2018, 12:06 PM

## 2018-04-05 NOTE — Progress Notes (Signed)
Critical ABG values given to 2H CCM Dr. Denese Killings at 8:12 by Jacques Navy RRT, RCP PH 7.11 C02 67.9 P02 57 Bicarb 22.5

## 2018-04-05 NOTE — Progress Notes (Signed)
Chaplain responded to Level 1 CPR in progress at 1:03. Pt in  Trauma A is a widow with large family.  Chaplain met up with incoming family and invited them to Consult B. Will be available. Tamsen Snider Pager [phone]

## 2018-04-05 NOTE — Procedures (Signed)
Arterial Catheter Insertion Procedure Note Carol Finley [id number] [birth year]  Procedure: Insertion of Arterial Catheter  Indications: Blood pressure monitoring and Frequent blood sampling  Procedure Details Consent: Unable to obtain consent because of altered level of consciousness. Time Out: Verified patient identification, verified procedure, site/side was marked, verified correct patient position, special equipment/implants available, medications/allergies/relevent history reviewed, required imaging and test results available.  Performed  Maximum sterile technique was used including antiseptics, cap, gloves, gown, hand hygiene, mask and sheet. Skin prep: Chlorhexidine; local anesthetic administered 20 gauge catheter was inserted into left radial artery using the Seldinger technique. ULTRASOUND GUIDANCE USED: NO Evaluation Blood flow good; BP tracing good. Complications: No apparent complications.   Chauncey Mann Mar 25, 2018

## 2018-04-05 NOTE — Progress Notes (Signed)
Critical ABG results called to E-link MD.

## 2018-04-05 NOTE — H&P (Signed)
HISTORY & PHYSICAL  Patient Name: Carol Finley MRN: [id number] DOB: 06/16/1950    ADMISSION DATE:  2018-03-28 DATE OF SERVICE:  03/28/2018  CHIEF COMPLAINT:  Cardiac arrest   HISTORY OF PRESENT ILLNESS  This 68 y.o. Hispanic female presented to the [facility] Emergency Department via EMS with complaints of syncope and cardiac arrest.  At the time of clinical interview, the patient is intubated and comatose.  Clinical history is obtained by discussion with multiple family members (2 sons, 1 daughter and multiple grandchildren).  The patient was last seen well just prior to going to bed tonight.  1 of the patient's granddaughters (who is wheelchair-bound and has spina bifida) was with the patient and noted that she was speaking on the phone with the lights out.  The granddaughter noticed that the patient suddenly became unresponsive and dropped her cellular phone.  She screamed out for help.  Another granddaughter called 911 and started CPR.  The granddaughter continued CPR efforts until police and fire rescue arrived, at which time EMTs continued CPR efforts.  According to verbal report, the patient was believed to have been in torsades the point.  She was defibrillated but the subsequent rhythm was noted to be ventricular fibrillation.  She was defibrillated again and subsequently proceeded to experience PEA arrest.  With ongoing CPR efforts, it appears that the patient did achieve sinus bradycardia, at which time external pacing was applied.  Exact downtime is not known; however, family members estimate that total CPR time must have been far in excess of 15 minutes.   REVIEW OF SYSTEMS This patient is critically ill and cannot provide additional history nor review of systems due to mental status/unconsciousness and endotracheally intubated.   PAST MEDICAL/SURGICAL/SOCIAL/FAMILY HISTORIES   Past Medical History:  Diagnosis Date  . BACTERIAL PNEUMONIA 10/09/2009    Qualifier: Diagnosis of  By: Huntley Dec, Scott    . CHF (congestive heart failure) (HCC)    grade 1 diastolic, 2016  . Diabetes mellitus without complication (HCC)   . Hypertension     Past Surgical History:  Procedure Laterality Date  . RIGHT/LEFT HEART CATH AND CORONARY ANGIOGRAPHY N/A 02/10/2018   Procedure: RIGHT/LEFT HEART CATH AND CORONARY ANGIOGRAPHY;  Surgeon: Swaziland, Peter M, MD;  Location: [facility] INVASIVE CV LAB;  Service: Cardiovascular;  Laterality: N/A;    Social History   Tobacco Use  . Smoking status: Never Smoker  . Smokeless tobacco: Never Used  Substance Use Topics  . Alcohol use: No    Family History  Problem Relation Age of Onset  . Diabetes Mother   . Hypertension Mother   . Heart disease Mother   . Hypertension Father     No Known Allergies   Prior to Admission medications   Medication Sig Start Date End Date Taking? Authorizing Provider  aspirin 81 MG tablet Take 1 tablet (81 mg total) by mouth daily. 11/19/14   Casey Burkitt, MD  atorvastatin (LIPITOR) 80 MG tablet Take 1 tablet (80 mg total) by mouth daily. 03/07/18   Rosalio Macadamia, NP  carvedilol (COREG) 12.5 MG tablet Take 0.5 tablets (6.25 mg total) by mouth 2 (two) times daily with a meal. 03/07/18   Rosalio Macadamia, NP  cloNIDine (CATAPRES) 0.2 MG tablet Take 1 tablet (0.2 mg total) by mouth 3 (three) times daily. 03/07/18   Rosalio Macadamia, NP  ferrous sulfate 325 (65 FE) MG tablet Take 1 tablet (325 mg total) by mouth daily with breakfast.  01/31/17   Anders Simmonds, PA-C  furosemide (LASIX) 40 MG tablet Take 1 tablet (40 mg total) by mouth 2 (two) times daily. 03/07/18   Rosalio Macadamia, NP  gabapentin (NEURONTIN) 300 MG capsule Take 2 capsules (600 mg total) by mouth 3 (three) times daily. 08/10/17   Hoy Register, MD  insulin NPH-regular Human (70-30) 100 UNIT/ML injection Inject 22 Units into the skin 2 (two) times daily with a meal. 02/16/18   Hoy Register, MD  Insulin  Syringe-Needle U-100 (BD INSULIN SYRINGE ULTRAFINE) 31G X 15/64" 0.5 ML MISC Use as directed 05/18/17   Hoy Register, MD  lisinopril (PRINIVIL,ZESTRIL) 40 MG tablet Take 1 tablet (40 mg total) by mouth daily. 03/07/18   Rosalio Macadamia, NP  metFORMIN (GLUCOPHAGE) 1000 MG tablet Take 1 tablet (1,000 mg total) by mouth 2 (two) times daily with a meal. 08/10/17   Newlin, Enobong, MD  VENTOLIN HFA 108 (90 Base) MCG/ACT inhaler INHALE 2 PUFFS INTO THE LUNGS EVERY SIX HOURS AS NEEDED FOR WHEEZING OR SHORTNESS OF BREATH. Patient taking differently: Inhale 2 puffs into the lungs every 6 (six) hours as needed.  01/12/18   Hoy Register, MD    Current Facility-Administered Medications  Medication Dose Route Frequency Provider Last Rate Last Dose  . amiodarone (NEXTERONE PREMIX) 360-4.14 MG/200ML-% (1.8 mg/mL) IV infusion  60 mg/hr Intravenous Continuous Mesner, Barbara Cower, MD      . amiodarone (NEXTERONE PREMIX) 360-4.14 MG/200ML-% (1.8 mg/mL) IV infusion  30 mg/hr Intravenous Continuous Mesner, Barbara Cower, MD      . fentaNYL in NS (58mcg/ml) infusion-PREMIX  0-400 mcg/hr Intravenous Continuous Mesner, Barbara Cower, MD      . midazolam (VERSED) injection 2 mg  2 mg Intravenous Q1H PRN Mesner, Barbara Cower, MD      . norepinephrine (LEVOPHED) 4-5 MG/250ML-% infusion SOLN           . vasopressin (PITRESSIN) 40 Units in sodium chloride 0.9 % 250 mL (0.16 Units/mL) infusion  0.03 Units/min Intravenous Continuous Mesner, Barbara Cower, MD       Current Outpatient Medications  Medication Sig Dispense Refill  . aspirin 81 MG tablet Take 1 tablet (81 mg total) by mouth daily. 30 tablet 5  . atorvastatin (LIPITOR) 80 MG tablet Take 1 tablet (80 mg total) by mouth daily. 90 tablet 3  . carvedilol (COREG) 12.5 MG tablet Take 0.5 tablets (6.25 mg total) by mouth 2 (two) times daily with a meal. 90 tablet 3  . cloNIDine (CATAPRES) 0.2 MG tablet Take 1 tablet (0.2 mg total) by mouth 3 (three) times daily. 270 tablet 3  . ferrous  sulfate 325 (65 FE) MG tablet Take 1 tablet (325 mg total) by mouth daily with breakfast. 100 tablet 1  . furosemide (LASIX) 40 MG tablet Take 1 tablet (40 mg total) by mouth 2 (two) times daily. 180 tablet 3  . gabapentin (NEURONTIN) 300 MG capsule Take 2 capsules (600 mg total) by mouth 3 (three) times daily. 180 capsule 6  . insulin NPH-regular Human (70-30) 100 UNIT/ML injection Inject 22 Units into the skin 2 (two) times daily with a meal. 30 mL 6  . Insulin Syringe-Needle U-100 (BD INSULIN SYRINGE ULTRAFINE) 31G X 15/64" 0.5 ML MISC Use as directed 100 each 2  . lisinopril (PRINIVIL,ZESTRIL) 40 MG tablet Take 1 tablet (40 mg total) by mouth daily. 90 tablet 3  . metFORMIN (GLUCOPHAGE) 1000 MG tablet Take 1 tablet (1,000 mg total) by mouth 2 (two) times daily with a meal.  60 tablet 6  . VENTOLIN HFA 108 (90 Base) MCG/ACT inhaler INHALE 2 PUFFS INTO THE LUNGS EVERY SIX HOURS AS NEEDED FOR WHEEZING OR SHORTNESS OF BREATH. (Patient taking differently: Inhale 2 puffs into the lungs every 6 (six) hours as needed. ) 18 g 1    VITAL SIGNS: BP 98/77   Pulse (!) 47   Resp (!) 39   SpO2 (!) 88%   VENTILATOR SETTINGS: Vent Mode: PRVC FiO2 (%):  [100 %] 100 % Set Rate:  [20 bmp] 20 bmp Vt Set:  [420 mL] 420 mL PEEP:  [5 cmH20] 5 cmH20  INTAKE / OUTPUT: No intake/output data recorded.  PHYSICAL EXAMINATION: GENERAL: Intubated. comatose or well-developed. No acute distress. HEAD: normocephalic, atraumatic EYE: PERRLA, EOM intact, no scleral icterus, no pallor. NOSE: nares are patent. No polyps. No exudate. No sinus tenderness. THROAT/ORAL CAVITY: Normal dentition. No oral thrush. No exudate. Mucous membranes are moist. No tonsillar enlargement. ETT in situ. NECK: supple, no thyromegaly, no JVD, no lymphadenopathy. Trachea midline. CHEST/LUNG: symmetric in development and expansion. Coarse breath sounds with loud rales and rhonchi. HEART: Regular S1 and S2 without murmur, rub or  gallop. ABDOMEN: soft, nontender, nondistended. Normoactive bowel sounds. No rebound. No guarding. No hepatosplenomegaly. EXTREMITIES: Edema: 1+. No cyanosis. No clubbing. 2+ DP pulses LYMPHATIC: no cervical/axillary/inguinal lymph nodes appreciated MUSCULOSKELETAL: No point tenderness. No  bulk atrophy. Joints: normal.  SKIN:  No rash or lesion. NEUROLOGIC: Doll's eyes equivocal. R corneal reflex intact. Spontaneous respirations intact. Cranial nerves II-XII are grossly symmetric and physiologic. Babinski absent. No cerebellar signs. Gait was not assessed.   LABS:  BASIC METABOLIC PROFILE Recent Labs  Lab Mar 21, 2018 0124 03/21/2018 0137  NA 141 143  K 3.8 4.0  CL 101 100  CO2  --  20*  BUN 25* 19  CREATININE 0.80 0.98  GLUCOSE 423* 447*  CALCIUM  --  7.2*   CBC Recent Labs  Lab 03-21-18 0124  HGB 13.6  HCT 40.0    CULTURES: Results for orders placed or performed in visit on 01/10/18  Fecal occult blood, imunochemical     Status: None   Collection Time: 01/10/18 12:00 AM  Result Value Ref Range Status   Fecal Occult Bld Negative Negative Final    ASSESSMENT / PLAN: Principal Problem:   Ventricular fibrillation (HCC) Active Problems:   Torsades de pointes (HCC)   Pneumothorax, right   Right bundle branch block (RBBB) with left anterior fascicular block (LAFB)   Leukocytosis   By systems: CARDIOVASCULAR  Cardiac arrest/PEA arrest  Torsades de pointes  Ventricular fibrillation  RBBB + LAFB Targeted temperature management (36 C) Check BNP Repeat EKG in am If the patient demonstrates favorable neurologic improvement, cardiology consultation is advised. Continue amiodarone gtt.  PULMONARY  R pneumothorax, small Monitor closely. Repeat CXR in 3 hours.   INFECTIOUS  Leukocytosis Empiric antibiotics for CAP coverage  RENAL  No acute issues   GASTROINTESTINAL  GI prophylaxis: famotidine   HEMATOLOGIC  DVT prophylaxis: heparin    Admit to ICU  under my service (Attending: Marcelle Smiling, MD) with the diagnoses highlighted above in the active Hospital Problem List (ASSESSMENT).   My assessment, plan of care, findings, medications, side effects, etc. were discussed with: nurse, respiratory therapist, hospital chaplain and patient's next of kin (answered all questions to his/her satisfaction).  FAMILY  - Updates: patient's family (2 sons, 1 daughter, multiple grandchildren)  - Inter-disciplinary family meet or Palliative Care meeting due by:  03/24/2018  Marcelle Smiling, MD Board Certified by the ABIM, Pulmonary Diseases & Critical Care Medicine  [facility] Pulmonary/Critical Care Critical Care Pager: [phone]  03/22/18, 2:12 AM

## 2018-04-05 NOTE — ED Triage Notes (Signed)
Pt here CPR in progress, witness arrest by family.  Was on the phone and collapsed, CPR started by family.  Pt collapsed shortly after taking insulin for the evening.   MEDS given by EMS: 9 syringes EPI, 450mg  Amiodorone, 2mg  Magnesium.   Initial rhythm torsades for EMS, PEA on arrival to the ED.   Defib x4

## 2018-04-05 NOTE — ED Provider Notes (Signed)
Emergency Department Provider Note   I have reviewed the triage vital signs and the nursing notes.   HISTORY  Chief Complaint Cardiac Arrest   HPI Carol Finley is a 68 y.o. female who presents to the emergency department with active CPR. Patient was a witnessed arrest by family. CPR immediately. EMS arrived and patient with torsades. Subsequently received 3 attempted cardioversions, pacing, 9 epi, 450 amiodarone and magnesium and had capture with pacing for short period but arrives with active CPR.  LEVEL V CAVEAT APPLIES SECONDARY TO CPR in progress   Past Medical History:  Diagnosis Date  . BACTERIAL PNEUMONIA 10/09/2009   Qualifier: Diagnosis of  By: Huntley Dec, Scott    . CHF (congestive heart failure) (HCC)    grade 1 diastolic, 2016  . Diabetes mellitus without complication (HCC)   . Hypertension     Patient Active Problem List   Diagnosis Date Noted  . Ventricular fibrillation (HCC) 03/20/2018  . Pneumothorax, right 03/20/18  . Torsades de pointes (HCC) 20-Mar-2018  . 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 Leukocytosis 2018-03-20  . Acute systolic CHF (congestive heart failure) (HCC) 02/09/2018  . Acute respiratory failure with hypoxia (HCC) 02/09/2018  . Acute on chronic diastolic (congestive) heart failure (HCC) 02/08/2018  . Helicobacter pylori gastritis 04/11/2017  . Gastroenteritis 09/06/2016  . Abdominal pain, periumbilical 08/19/2016  . Epistaxis 07/13/2016  . Insomnia 05/10/2016  . Acute pulpitis 01/29/2016  . Osteoarthritis 08/29/2015  . Routine screening for STI (sexually transmitted infection) 04/16/2015  . Diastolic dysfunction 11/19/2014  . Neuropathy 06/20/2013  . Right knee pain 06/20/2013  . Decreased visual acuity 11/15/2012  . Hyperlipidemia 08/08/2012  . Annual physical exam 05/07/2012  . ALLERGIC RHINITIS 06/19/2010  . OBESITY 03/23/2010  . ONYCHOMYCOSIS, BILATERAL  03/03/2009  . Diabetes type 2, uncontrolled (HCC) 12/02/2008  . Essential hypertension, benign 12/02/2008    Past Surgical History:  Procedure Laterality Date  . RIGHT/LEFT HEART CATH AND CORONARY ANGIOGRAPHY N/A 02/10/2018   Procedure: RIGHT/LEFT HEART CATH AND CORONARY ANGIOGRAPHY;  Surgeon: [place], Peter M, MD;  Location: [facility] INVASIVE CV LAB;  Service: Cardiovascular;  Laterality: N/A;    Current Outpatient Rx  . Order #: [id number] Class: Fax  . Order #: [id number] Class: Normal  . Order #: [id number] Class: Normal  . Order #: [id number] Class: Normal  . Order #: [id number] Class: Normal  . Order #: [id number] Class: Normal  . Order #: [id number] Class: Normal  . Order #: [id number] Class: Normal  . Order #: [id number] Class: Normal  . Order #: [id number] Class: Normal  . Order #: [id number] Class: Normal  . Order #: [id number] Class: Normal    Allergies Patient has no known allergies.  Family History  Problem Relation Age of Onset  . Diabetes Mother   . Hypertension Mother   . Heart disease Mother   . Hypertension Father     Social History Social History   Tobacco Use  . Smoking status: Never Smoker  . Smokeless tobacco: Never Used  Substance Use Topics  . Alcohol use: No  . Drug use: No    Review of Systems  LEVEL V CAVEAT APPLIES SECONDARY TO CPR in progress ____________________________________________  PHYSICAL EXAM:  VITAL SIGNS: ED Triage Vitals  Enc Vitals Group     BP March 20, 2018 0112 138/63     Pulse Rate 03/20/18 0114 (!) 103     Resp March 20, 2018 0118 19     Temp 20-Mar-2018 0206 (!) 92.7 F (33.7 C)  Temp src --      SpO2 2018-03-30 0114 94 %     Weight 03-30-2018 0430 174 lb 13.2 oz (79.3 kg)     Height 03/30/2018 0430 5\' 3"  (1.6 m)    Constitutional: Ill appearing. Well nourished. Eyes: Conjunctivae are normal. Reactive but very sluggish equal Pupils.  Head: Atraumatic. Nose: No congestion/rhinnorhea. Mouth/Throat: Mucous membranes are moist but bloody without  obvious injury.  Oropharynx non-erythematous. Neck: No stridor.  No meningeal signs.   Cardiovascular: asystole.  Respiratory: No respiratory effort.   Gastrointestinal: Soft and nontender. No distention.  Musculoskeletal: No lower extremity tenderness nor edema. No gross deformities of extremities. Neurologic:  Not able to assess 2/2 condition.  Skin:   No rash noted. Psych: Not able to assess 2/2 condition.   ____________________________________________   LABS (all labs ordered are listed, but only abnormal results are displayed)  Labs Reviewed  BASIC METABOLIC PANEL - Abnormal; Notable for the following components:      Result Value   CO2 20 (*)    Glucose, Bld 447 (*)    Calcium 7.2 (*)    GFR calc non Af Amer 60 (*)    Anion gap 23 (*)    All other components within normal limits  CBC - Abnormal; Notable for the following components:   WBC 16.2 (*)    MCHC 29.1 (*)    Platelets 107 (*)    nRBC 1.2 (*)    All other components within normal limits  LACTIC ACID, PLASMA - Abnormal; Notable for the following components:   Lactic Acid, Venous 14.1 (*)    All other components within normal limits  LACTIC ACID, PLASMA - Abnormal; Notable for the following components:   Lactic Acid, Venous 12.0 (*)    All other components within normal limits  MAGNESIUM - Abnormal; Notable for the following components:   Magnesium 3.1 (*)    All other components within normal limits  BRAIN NATRIURETIC PEPTIDE - Abnormal; Notable for the following components:   B Natriuretic Peptide 139.0 (*)    All other components within normal limits  CBC - Abnormal; Notable for the following components:   WBC 15.1 (*)    RBC 3.08 (*)    Hemoglobin 8.8 (*)    HCT 30.1 (*)    MCHC 29.2 (*)    RDW 15.6 (*)    Platelets 91 (*)    nRBC 0.6 (*)    All other components within normal limits  CREATININE, SERUM - Abnormal; Notable for the following components:   Creatinine, Ser 1.41 (*)    GFR calc non Af  Amer 38 (*)    GFR calc Af Amer 45 (*)    All other components within normal limits  CBC - Abnormal; Notable for the following components:   WBC 15.0 (*)    Hemoglobin 11.1 (*)    MCHC 28.8 (*)    Platelets 141 (*)    All other components within normal limits  PROTIME-INR - Abnormal; Notable for the following components:   Prothrombin Time 49.2 (*)    INR 5.51 (*)    All other components within normal limits  APTT - Abnormal; Notable for the following components:   aPTT 158 (*)    All other components within normal limits  BASIC METABOLIC PANEL - Abnormal; Notable for the following components:   Chloride 97 (*)    CO2 15 (*)    Glucose, Bld 592 (*)    Creatinine, Ser 1.24 (*)  Calcium 7.5 (*)    GFR calc non Af Amer 45 (*)    GFR calc Af Amer 52 (*)    Anion gap 23 (*)    All other components within normal limits  PROTIME-INR - Abnormal; Notable for the following components:   Prothrombin Time 22.6 (*)    All other components within normal limits  APTT - Abnormal; Notable for the following components:   aPTT 104 (*)    All other components within normal limits  HEMOGLOBIN A1C - Abnormal; Notable for the following components:   Hgb A1c MFr Bld 7.9 (*)    All other components within normal limits  APTT - Abnormal; Notable for the following components:   aPTT 109 (*)    All other components within normal limits  PROTIME-INR - Abnormal; Notable for the following components:   Prothrombin Time 27.6 (*)    All other components within normal limits  MAGNESIUM - Abnormal; Notable for the following components:   Magnesium 2.7 (*)    All other components within normal limits  PHOSPHORUS - Abnormal; Notable for the following components:   Phosphorus 8.8 (*)    All other components within normal limits  TROPONIN I - Abnormal; Notable for the following components:   Troponin I 5.86 (*)    All other components within normal limits  I-STAT TROPONIN, ED - Abnormal; Notable for the  following components:   Troponin i, poc 0.20 (*)    All other components within normal limits  I-STAT CHEM 8, ED - Abnormal; Notable for the following components:   BUN 25 (*)    Glucose, Bld 423 (*)    Calcium, Ion 0.86 (*)    All other components within normal limits  CBG MONITORING, ED - Abnormal; Notable for the following components:   Glucose-Capillary 441 (*)    All other components within normal limits  POCT I-STAT 3, ART BLOOD GAS (G3+) - Abnormal; Notable for the following components:   pH, Arterial 7.124 (*)    pCO2 arterial 50.6 (*)    pO2, Arterial 175.0 (*)    Bicarbonate 17.0 (*)    TCO2 19 (*)    Acid-base deficit 13.0 (*)    All other components within normal limits  POCT I-STAT, CHEM 8 - Abnormal; Notable for the following components:   Sodium 127 (*)    Potassium 5.3 (*)    Chloride 93 (*)    BUN 28 (*)    Creatinine, Ser 1.10 (*)    Glucose, Bld 662 (*)    Calcium, Ion 0.96 (*)    All other components within normal limits  POCT I-STAT 3, ART BLOOD GAS (G3+) - Abnormal; Notable for the following components:   pH, Arterial 7.120 (*)    pO2, Arterial 112.0 (*)    Bicarbonate 14.7 (*)    TCO2 16 (*)    Acid-base deficit 15.0 (*)    All other components within normal limits  POCT I-STAT, CHEM 8 - Abnormal; Notable for the following components:   Sodium 127 (*)    BUN 30 (*)    Creatinine, Ser 1.20 (*)    Glucose, Bld 687 (*)    Calcium, Ion 0.96 (*)    TCO2 17 (*)    Hemoglobin 11.2 (*)    HCT 33.0 (*)    All other components within normal limits  POCT I-STAT 3, ART BLOOD GAS (G3+) - Abnormal; Notable for the following components:   pH, Arterial 7.116 (*)  pCO2 arterial 67.9 (*)    pO2, Arterial 57.0 (*)    Acid-base deficit 8.0 (*)    All other components within normal limits  MRSA PCR SCREENING  CULTURE, BLOOD (ROUTINE X 2)  CULTURE, BLOOD (ROUTINE X 2)  URINE CULTURE  CULTURE, RESPIRATORY  RAPID HIV SCREEN (HIV 1/2 AB+AG)  PROCALCITONIN    CORTISOL  HEPATITIS PANEL, ACUTE   ____________________________________________  EKG   EKG Interpretation  Date/Time:  Saturday Apr 12, 2018 01:16:33 EST Ventricular Rate:  86 PR Interval:    QRS Duration: 165 QT Interval:  505 QTC Calculation: 605 R Axis:   -63 Text Interpretation:  Sinus or ectopic atrial rhythm RBBB and LAFB improved from STEMI on most recent Confirmed by Marily Memos [phone]) on 2018-04-12 11:30:20 PM       ____________________________________________  RADIOLOGY  Ct Head Wo Contrast  Result Date: 2018/04/12 CLINICAL DATA:  Cardiac arrest. History of hypertension and diabetes. EXAM: CT HEAD WITHOUT CONTRAST TECHNIQUE: Contiguous axial images were obtained from the base of the skull through the vertex without intravenous contrast. COMPARISON:  None. FINDINGS: BRAIN: No intraparenchymal hemorrhage, mass effect nor midline shift. The ventricles and sulci are normal for age. Minimal supratentorial white matter hypodensities less than expected for patient's age, though non-specific are most compatible with chronic small vessel ischemic disease. No acute large vascular territory infarcts. No abnormal extra-axial fluid collections. Basal cisterns are patent. VASCULAR: Luminal calcific atherosclerosis of the carotid siphons. SKULL: No skull fracture. No significant scalp soft tissue swelling. SINUSES/ORBITS: Sphenoid sinus air-fluid levels with mild paranasal sinus mucosal thickening. 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 OTHER: Patient is edentulous. IMPRESSION: Normal noncontrast CT HEAD for age. Electronically Signed   By: Awilda Metro M.D.   On: 04-12-18 04:50   Dg Chest Port 1 View  Result Date: 04-12-2018 CLINICAL DATA:  68 year old female with right-sided pneumothorax EXAM: PORTABLE CHEST 1 VIEW COMPARISON:  Prior chest x-ray earlier today at 2:21 a.m. FINDINGS: The  endotracheal tube has advanced and is now 1 cm above the carina. Gastric tube in unchanged position overlying the gastric fundus. Perhaps slight interval increase in the size of the right-sided pneumothorax. Pleural displacement from the rib margin measures up to 12 mm today compared to 9 mm previously. Pneumothorax volume remains less than 15%. Persistent and perhaps slightly increased diffuse bilateral interstitial and airspace opacities. No acute osseous abnormality. IMPRESSION: 1. Similar to perhaps minimally enlarged right sided pneumothorax. Pneumothorax volume remains no more than 15%. 2. Low position of the endotracheal tube approximately 1 cm above the carina. 3. Progressive diffuse bilateral interstitial and airspace opacities which may represent a combination of pulmonary edema and multifocal pneumonia versus ARDS. Electronically Signed   By: Malachy Moan M.D.   On: 12-Apr-2018 10:14   Dg Chest Portable 1 View  Result Date: 04/12/2018 CLINICAL DATA:  Reposition tubes. EXAM: PORTABLE CHEST 1 VIEW COMPARISON:  Radiograph earlier this day at 0137 hour FINDINGS: Endotracheal tube has been retracted, tip now 3.4 cm from the carina. Enteric tube is in place, coursing below the diaphragm, likely coiled in the stomach, tip not included in the field of view. Small right pneumothorax appears similar to prior exam. Small amount subcutaneous emphysema noted about the right chest wall. Cardiomegaly and multifocal pulmonary opacities are again seen. IMPRESSION: 1. Endotracheal tube has been retracted, tip now 3.4 cm from the carina. Enteric tube in place, likely coiled in the stomach, tip  not included in the field of view. 2. Right pneumothorax appears similar to prior exam. Subcutaneous emphysema in the right chest wall, new. 3. Unchanged cardiomegaly. Unchanged multifocal pulmonary opacities which may represent pulmonary edema, aspiration, or multifocal pneumonia. Electronically Signed   By: Narda Rutherford  M.D.   On: 04-17-2018 02:33   Dg Chest Portable 1 View  Result Date: 2018/04/17 CLINICAL DATA:  Post intubation/CPR. EXAM: PORTABLE CHEST 1 VIEW COMPARISON:  02/08/2018 FINDINGS: Endotracheal tube is at the origin of the right mainstem bronchus. Small right pneumothorax. Cardiomegaly is similar to prior. Diffuse lung opacities, greatest in the perihilar region. No large pleural effusion. IMPRESSION: 1. Endotracheal tube at the origin of the right mainstem bronchus. Recommend retraction of 2.8 cm. 2. Small right pneumothorax. 3. Diffuse lung opacities, greatest in the perihilar region, may reflect pulmonary edema or multifocal pneumonia. 4. Cardiomegaly. Critical Value/emergent results were called by telephone at the time of interpretation on 2018/04/17 at 2:10 am to Dr. Marily Memos , who verbally acknowledged these results. Electronically Signed   By: Narda Rutherford M.D.   On: 04-17-2018 02:10   ____________________________________________   PROCEDURES  Procedure(s) performed:   Procedure Name: Intubation Date/Time: 17-Apr-2018 11:32 PM Performed by: Marily Memos, MD Pre-anesthesia Checklist: Patient identified, Patient being monitored, Emergency Drugs available, Timeout performed and Suction available Oxygen Delivery Method: Non-rebreather mask Preoxygenation: Pre-oxygenation with 100% oxygen Induction Type: Rapid sequence Ventilation: Mask ventilation without difficulty Laryngoscope Size: Glidescope and 3 Grade View: Grade I Tube size: 7.5 mm Number of attempts: 1 Airway Equipment and Method: Rigid stylet Placement Confirmation: ETT inserted through vocal cords under direct vision,  CO2 detector and Breath sounds checked- equal and bilateral Secured at: 24 cm Difficulty Due To: Difficulty was anticipated Future Recommendations: Recommend- induction with short-acting agent, and alternative techniques readily available    .Critical Care Performed by: Marily Memos,  MD Authorized by: Marily Memos, MD   Critical care provider statement:    Critical care time (minutes):  45   Critical care was necessary to treat or prevent imminent or life-threatening deterioration of the following conditions:  Cardiac failure, CNS failure or compromise, shock, respiratory failure and circulatory failure   Critical care was time spent personally by me on the following activities:  Discussions with consultants, evaluation of patient's response to treatment, examination of patient, ordering and performing treatments and interventions, ordering and review of laboratory studies, ordering and review of radiographic studies, pulse oximetry, re-evaluation of patient's condition, obtaining history from patient or surrogate and review of old charts   I assumed direction of critical care for this patient from another provider in my specialty: no   .Central Line Date/Time: 2018-04-17 11:33 PM Performed by: Marily Memos, MD Authorized by: Marily Memos, MD   Consent:    Consent obtained:  Emergent situation Pre-procedure details:    Hand hygiene: Hand hygiene performed prior to insertion     Skin preparation:  2% chlorhexidine Anesthesia (see MAR for exact dosages):    Anesthesia method:  None Procedure details:    Location:  R femoral   Site selection rationale:  Active cpr   Patient position:  Flat   Procedural supplies:  Triple lumen   Ultrasound guidance: yes     Sterile ultrasound techniques: Sterile gel and sterile probe covers were used     Number of attempts:  1   Successful placement: yes   Post-procedure details:    Post-procedure:  Dressing applied and line sutured   Assessment:  Blood return through all ports and free fluid flow   Patient tolerance of procedure:  Tolerated well, no immediate complications   Cardiopulmonary Resuscitation (CPR) Procedure Note Directed/Performed by: Marily Memos I personally directed ancillary staff and/or performed CPR in an  effort to regain return of spontaneous circulation and to maintain cardiac, neuro and systemic perfusion.     EMERGENCY DEPARTMENT [place] CARDIAC EXAM "Study: Limited Ultrasound of the heart and pericardium"  INDICATIONS:Cardiac arrest Multiple views of the heart and pericardium were obtained in real-time with a multi-frequency probe.  PERFORMED NG:[id number]  IMAGES ARCHIVED?: No  FINDINGS: No pericardial effusion, Decreased contractility and Tamponade physiology absent  LIMITATIONS:  Body habitus and Emergent procedure  VIEWS USED: Subcostal 4 chamber  INTERPRETATION: Cardiac activity present  CPT Code: [phone] (limited transthoracic cardiac)   ____________________________________________   INITIAL IMPRESSION / ASSESSMENT AND PLAN / ED COURSE  Pertinent labs & imaging results that were available during my care of the patient were reviewed by me and considered in my medical decision making (see chart for details).  Patient arrived with CPR in progress. Intubated/central line placed. Gave epi/bicarb and atropine with ROSC. Lost pulses again, another dose of epi, started more fluid boluses and norepi with ROSC. Family discussion and patient is DNR but still wants maximal effort to that point. ECG without signfiicant abnormality, but cooled secondary to witnessed arrest with shockable rhythm. amio infusion started. Sedation started.  Found to have PTX on XR after admitted, discussed with inpatient team, will repeat XR and manage from there. vbg with significant acidosis, vent adjusted.   ____________________________________________  FINAL CLINICAL IMPRESSION(S) / ED DIAGNOSES  Final diagnoses:  Cardiac arrest (HCC)  Acute respiratory failure with hypoxia (HCC)    MEDICATIONS GIVEN DURING THIS VISIT:  Medications  amiodarone (NEXTERONE PREMIX) 360-4.14 MG/200ML-% (1.8 mg/mL) IV infusion (60 mg/hr Intravenous Rate/Dose Verify Apr 08, 2018 0600)  EPINEPHrine (ADRENALIN) 1 MG/10ML  injection (1 Syringe Intravenous Given 04-08-2018 0105)  atropine injection (1 mg Intravenous Given 04-08-18 0106)  0.9 %  sodium chloride infusion (1,000 mLs Intravenous New Bag/Given 04-08-2018 0109)  sodium bicarbonate injection (50 mEq Intravenous Given April 08, 2018 0108)  EPINEPHrine (ADRENALIN) 1 MG/10ML injection (1 Syringe Intravenous Given 04/08/18 0110)  norepinephrine (LEVOPHED) 4-5 MG/250ML-% infusion SOLN (  Stopped 04-08-2018 0152)  EPINEPHrine (ADRENALIN) 1 MG/10ML injection (1 Syringe Intravenous Given 2018-04-08 0124)  norepinephrine (LEVOPHED) 4-5 MG/250ML-% infusion SOLN (50 mcg/min  New Bag/Given 08-Apr-2018 0153)  EPINEPHrine (ADRENALIN) 1 MG/10ML injection (1 Syringe Intravenous Given 04/08/2018 0124)  EPINEPHrine (ADRENALIN) 1 MG/10ML injection (1 Syringe Intravenous Given 04-08-18 0130)  norepinephrine (LEVOPHED) 4-5 MG/250ML-% infusion SOLN (4 mg  New Bag/Given 2018-04-08 0619)  norepinephrine (LEVOPHED) 4-5 MG/250ML-% infusion SOLN (  New Bag/Given 04/08/2018 0500)  norepinephrine (LEVOPHED) 4-5 MG/250ML-% infusion SOLN (  Rate/Dose Change Apr 08, 2018 0700)    NEW OUTPATIENT MEDICATIONS STARTED DURING THIS VISIT:  Discharge Medication List as of 08-Apr-2018 12:21 PM      Note:  This document was prepared using Dragon voice recognition software and may include unintentional dictation errors.    Marily Memos, MD 04/08/2018 [phone]

## 2018-04-05 NOTE — Progress Notes (Signed)
RR increased from 20 to 25 per MD order

## 2018-04-05 NOTE — Progress Notes (Signed)
Patient expired and pronounced by Dr. Denese Killings at [phone]. Family present at bedside. Stephanie at [facility] called CDS. Zorita Pang called for last rites. Will continue to provide emotional support to the family.

## 2018-04-05 DEATH — deceased

## 2018-05-10 ENCOUNTER — Ambulatory Visit: Payer: Self-pay | Admitting: Family Medicine

## 2018-05-30 ENCOUNTER — Ambulatory Visit: Payer: Self-pay | Admitting: Nurse Practitioner

## 2019-11-27 IMAGING — CT CT HEAD W/O CM
4 series · 15 of 47 positions shown, 17 images · non-contrast
Comparison: None.

CLINICAL DATA: Cardiac arrest. History of hypertension and
diabetes.

EXAM:
CT HEAD WITHOUT CONTRAST
TECHNIQUE: Contiguous axial images were obtained from the base of the skull
through the vertex without intravenous contrast.

[Series 3: head wo · axial · 0.45mm/px · z∈[-216,-96]mm · 7 of 34 slices shown, 9 images]
[im 5/34  brain]
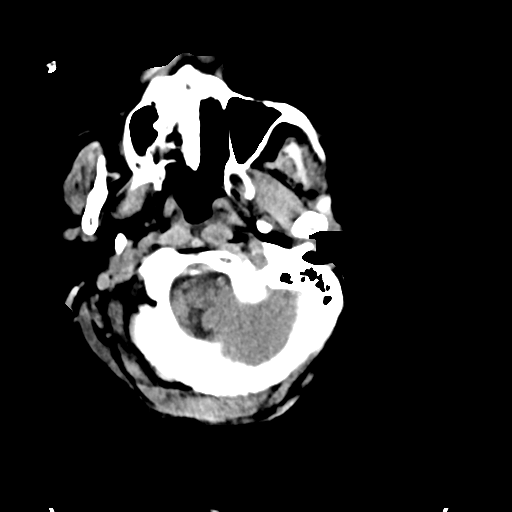
[im 5/34  bone]
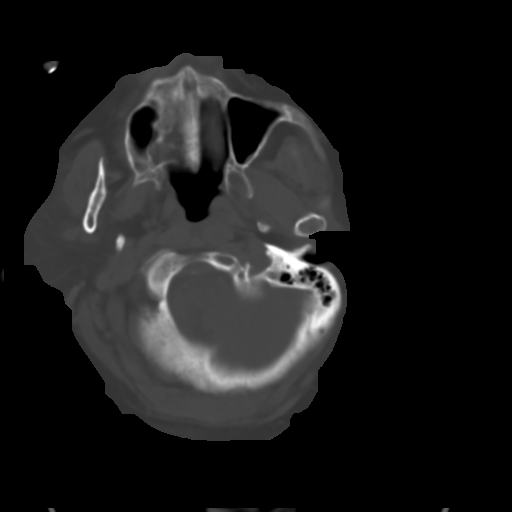
[im 9/34  brain]
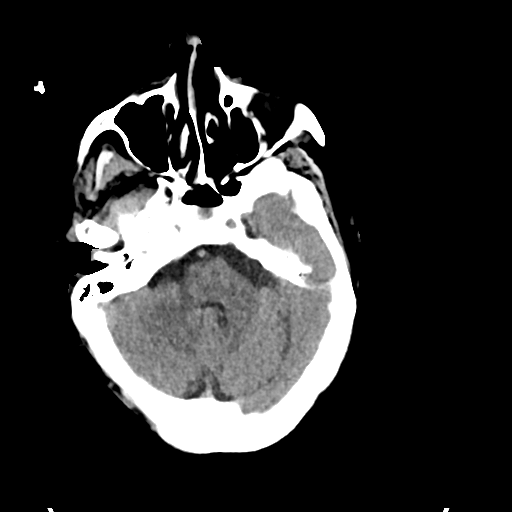
[im 13/34  brain]
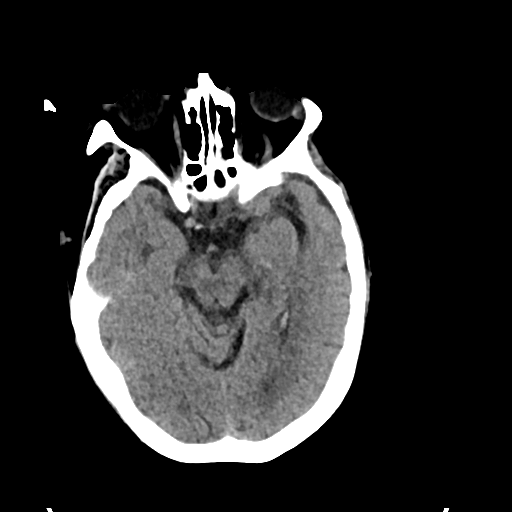
[im 17/34  brain]
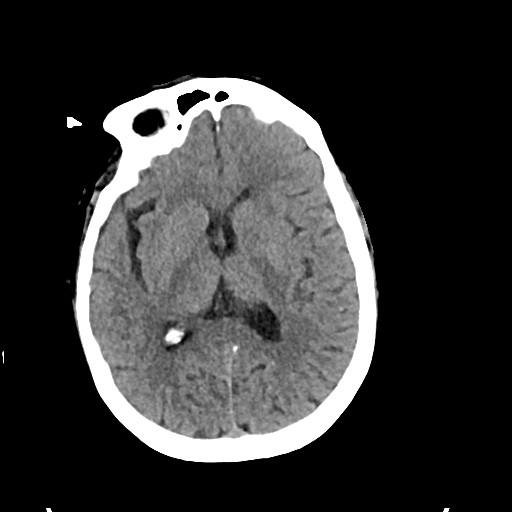
[im 21/34  brain]
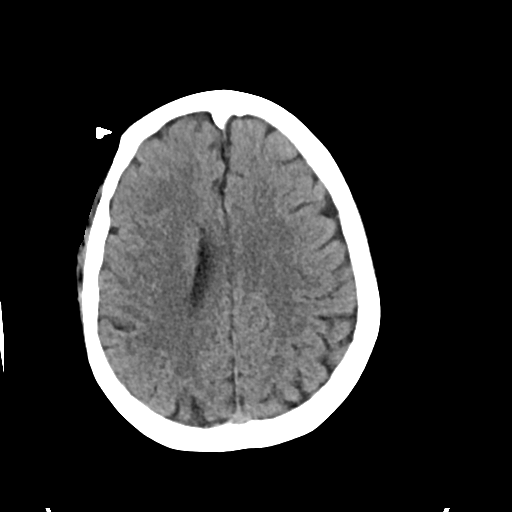
[im 21/34  bone]
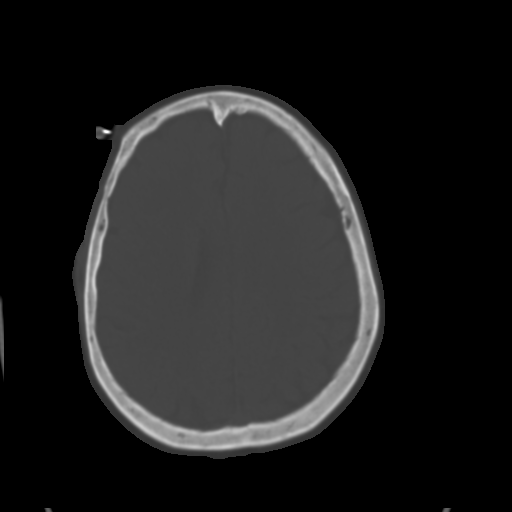
[im 25/34  brain]
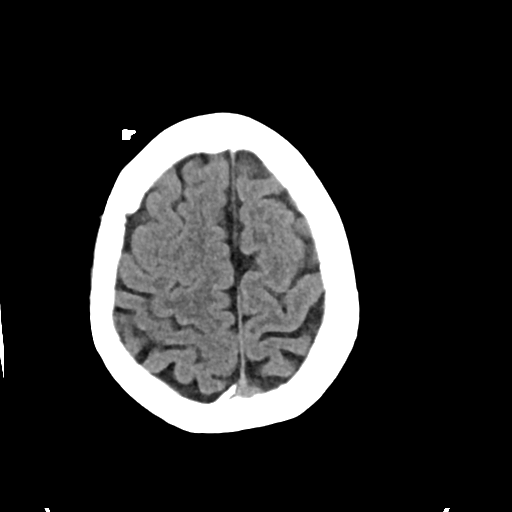
[im 29/34  brain]
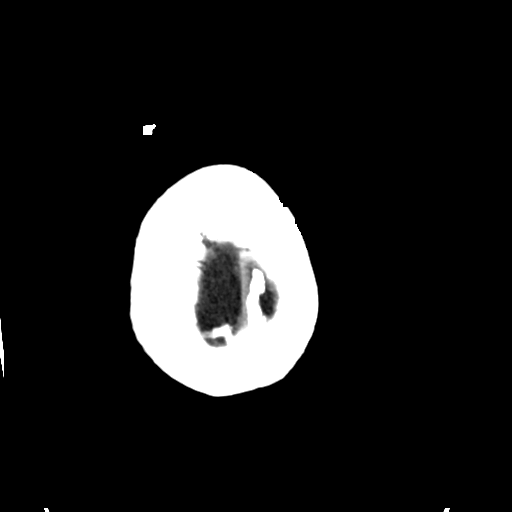

[Series 4: head bone · axial · 0.45mm/px · z∈[-220,-204]mm · 2 of 84 slices shown]
[im 9/84  bone]
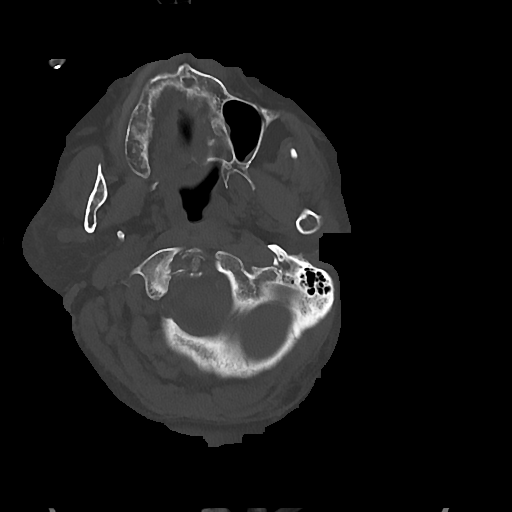
[im 17/84  bone]
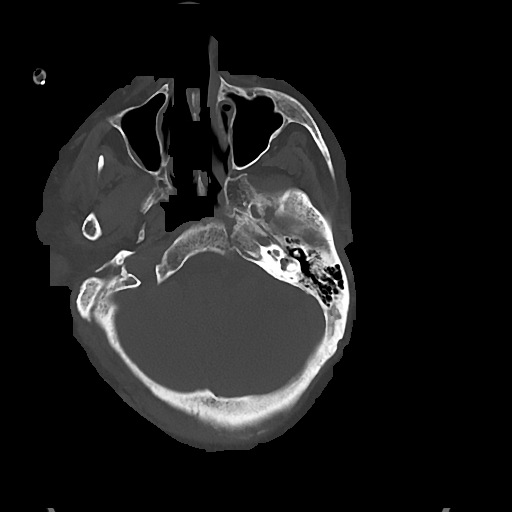

[Series 5: cor soft · coronal · 0.32mm/px · 3 of 71 slices shown]
[im 24/71  brain]
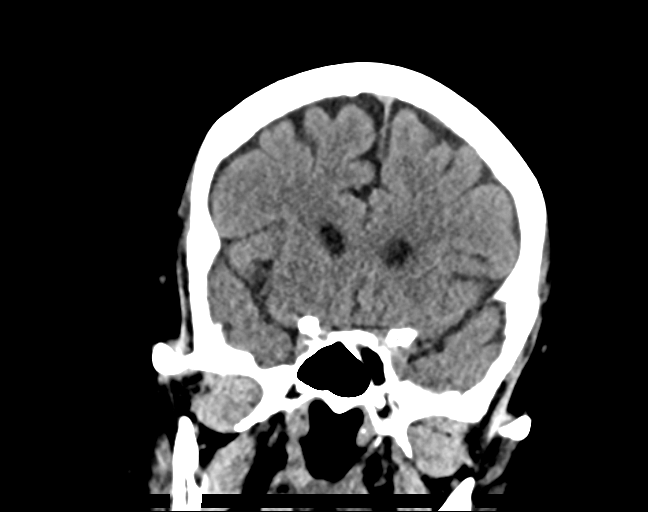
[im 32/71  brain]
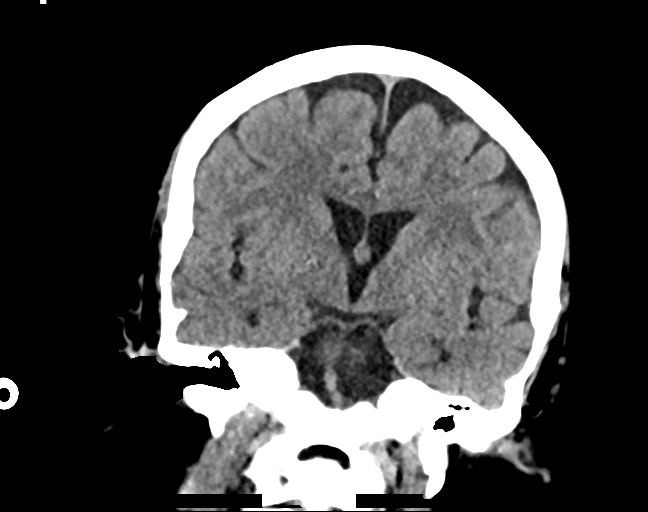
[im 39/71  brain]
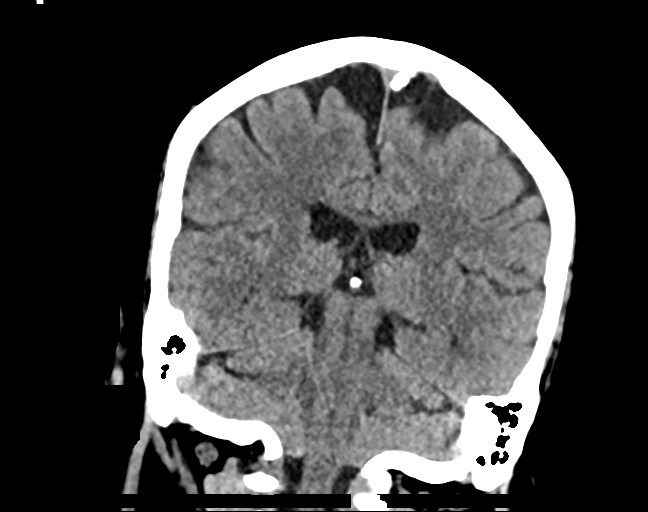

[Series 6: sag soft · sagittal · 0.32mm/px · 3 of 67 slices shown]
[im 23/67  brain]
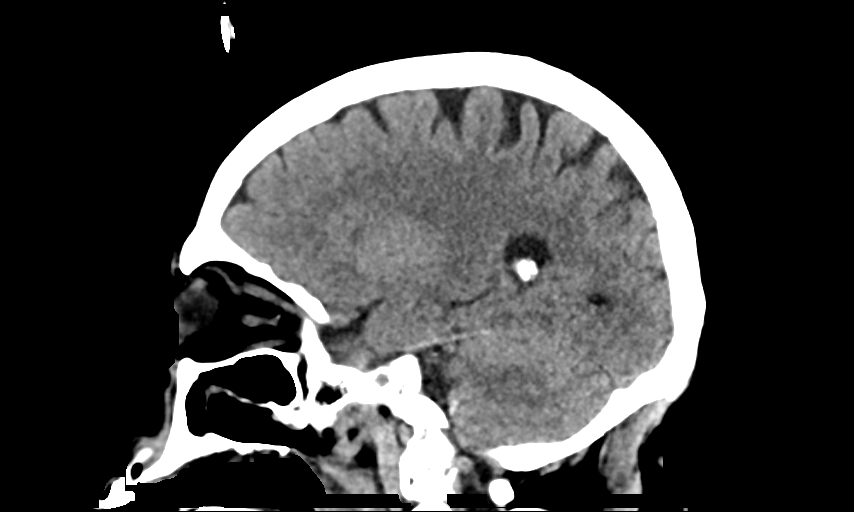
[im 34/67  brain]
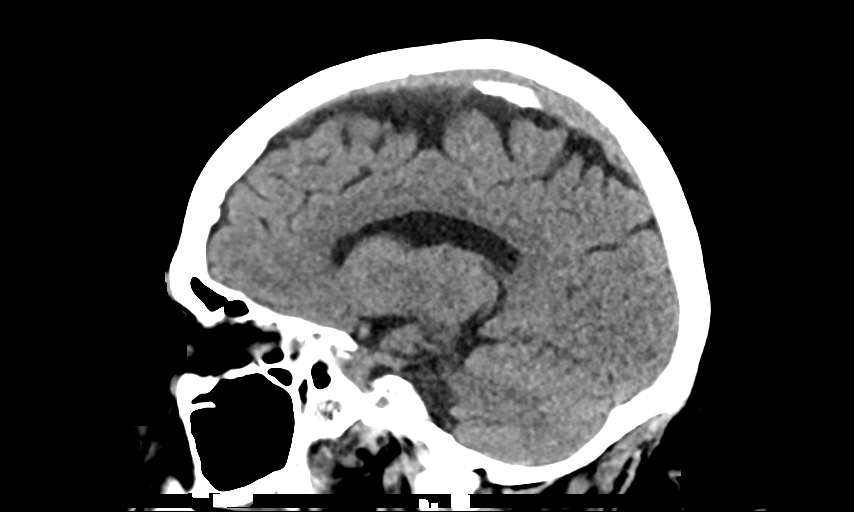
[im 45/67  brain]
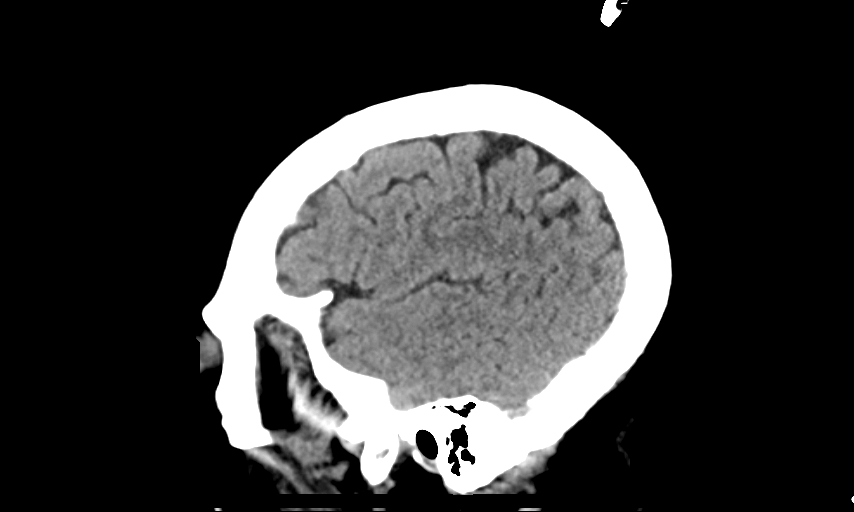

[15 of 47 positions shown; findings below may reference images not displayed]

FINDINGS: BRAIN: No intraparenchymal hemorrhage, mass effect nor midline
shift. The ventricles and sulci are normal for age. Minimal
supratentorial white matter hypodensities less than expected for
patient's age, though non-specific are most compatible with chronic
small vessel ischemic disease. No acute large vascular territory
infarcts. No abnormal extra-axial fluid collections. Basal cisterns
are patent.

VASCULAR: Luminal calcific atherosclerosis of the carotid siphons.

SKULL: No skull fracture. No significant scalp soft tissue swelling.

SINUSES/ORBITS: Sphenoid sinus air-fluid levels with mild paranasal
sinus mucosal thickening. Mastoid air cells are well aerated.Oblong
RIGHT ocular globe seen with increased intra-ocular pressure or
myopia.

OTHER: Patient is edentulous.
IMPRESSION: Normal noncontrast CT HEAD for age.
